# Patient Record
Sex: Female | Born: 1935 | Race: Black or African American | Hispanic: No | Smoking: Never smoker
Health system: Southern US, Community
[De-identification: ages and names within clinical notes are randomized; demographics above are authoritative.]

## PROBLEM LIST (undated history)

## (undated) DIAGNOSIS — K219 Gastro-esophageal reflux disease without esophagitis: Secondary | ICD-10-CM

## (undated) DIAGNOSIS — N3010 Interstitial cystitis (chronic) without hematuria: Secondary | ICD-10-CM

## (undated) DIAGNOSIS — K5900 Constipation, unspecified: Secondary | ICD-10-CM

## (undated) DIAGNOSIS — K5790 Diverticulosis of intestine, part unspecified, without perforation or abscess without bleeding: Secondary | ICD-10-CM

## (undated) DIAGNOSIS — N329 Bladder disorder, unspecified: Secondary | ICD-10-CM

## (undated) DIAGNOSIS — N289 Disorder of kidney and ureter, unspecified: Secondary | ICD-10-CM

## (undated) HISTORY — PX: HERNIA REPAIR: SHX51

---

## 1998-09-06 ENCOUNTER — Ambulatory Visit (HOSPITAL_COMMUNITY): Admission: RE | Admit: 1998-09-06 | Discharge: 1998-09-06 | Payer: Self-pay | Admitting: Pulmonary Disease

## 1998-09-06 ENCOUNTER — Encounter: Payer: Self-pay | Admitting: Pulmonary Disease

## 1999-10-10 ENCOUNTER — Emergency Department (HOSPITAL_COMMUNITY): Admission: EM | Admit: 1999-10-10 | Discharge: 1999-10-10 | Payer: Self-pay | Admitting: Emergency Medicine

## 2000-01-01 ENCOUNTER — Encounter: Admission: RE | Admit: 2000-01-01 | Discharge: 2000-01-01 | Payer: Self-pay | Admitting: Urology

## 2000-01-01 ENCOUNTER — Encounter: Payer: Self-pay | Admitting: Urology

## 2000-01-02 ENCOUNTER — Ambulatory Visit (HOSPITAL_BASED_OUTPATIENT_CLINIC_OR_DEPARTMENT_OTHER): Admission: RE | Admit: 2000-01-02 | Discharge: 2000-01-02 | Payer: Self-pay | Admitting: Urology

## 2000-01-12 ENCOUNTER — Encounter: Payer: Self-pay | Admitting: Urology

## 2000-01-12 ENCOUNTER — Encounter: Admission: RE | Admit: 2000-01-12 | Discharge: 2000-01-12 | Payer: Self-pay | Admitting: Urology

## 2000-02-05 ENCOUNTER — Other Ambulatory Visit: Admission: RE | Admit: 2000-02-05 | Discharge: 2000-02-05 | Payer: Self-pay | Admitting: *Deleted

## 2000-02-06 ENCOUNTER — Other Ambulatory Visit: Admission: RE | Admit: 2000-02-06 | Discharge: 2000-02-06 | Payer: Self-pay | Admitting: *Deleted

## 2000-02-06 ENCOUNTER — Encounter (INDEPENDENT_AMBULATORY_CARE_PROVIDER_SITE_OTHER): Payer: Self-pay

## 2000-04-11 ENCOUNTER — Ambulatory Visit (HOSPITAL_COMMUNITY): Admission: RE | Admit: 2000-04-11 | Discharge: 2000-04-11 | Payer: Self-pay | Admitting: *Deleted

## 2000-04-11 ENCOUNTER — Encounter (INDEPENDENT_AMBULATORY_CARE_PROVIDER_SITE_OTHER): Payer: Self-pay | Admitting: Specialist

## 2001-03-03 ENCOUNTER — Encounter: Admission: RE | Admit: 2001-03-03 | Discharge: 2001-03-03 | Payer: Self-pay | Admitting: Urology

## 2001-03-03 ENCOUNTER — Encounter: Payer: Self-pay | Admitting: Urology

## 2001-03-13 ENCOUNTER — Ambulatory Visit (HOSPITAL_COMMUNITY): Admission: RE | Admit: 2001-03-13 | Discharge: 2001-03-13 | Payer: Self-pay | Admitting: *Deleted

## 2001-03-18 ENCOUNTER — Encounter: Payer: Self-pay | Admitting: *Deleted

## 2001-03-18 ENCOUNTER — Ambulatory Visit (HOSPITAL_COMMUNITY): Admission: RE | Admit: 2001-03-18 | Discharge: 2001-03-18 | Payer: Self-pay | Admitting: *Deleted

## 2001-06-16 ENCOUNTER — Ambulatory Visit (HOSPITAL_COMMUNITY): Admission: RE | Admit: 2001-06-16 | Discharge: 2001-06-16 | Payer: Self-pay | Admitting: *Deleted

## 2001-06-16 ENCOUNTER — Encounter: Payer: Self-pay | Admitting: *Deleted

## 2004-09-26 ENCOUNTER — Encounter (INDEPENDENT_AMBULATORY_CARE_PROVIDER_SITE_OTHER): Payer: Self-pay | Admitting: Specialist

## 2004-09-26 ENCOUNTER — Ambulatory Visit (HOSPITAL_COMMUNITY): Admission: RE | Admit: 2004-09-26 | Discharge: 2004-09-26 | Payer: Self-pay | Admitting: *Deleted

## 2004-12-12 ENCOUNTER — Encounter: Admission: RE | Admit: 2004-12-12 | Discharge: 2004-12-12 | Payer: Self-pay | Admitting: *Deleted

## 2004-12-19 ENCOUNTER — Emergency Department (HOSPITAL_COMMUNITY): Admission: EM | Admit: 2004-12-19 | Discharge: 2004-12-19 | Payer: Self-pay | Admitting: Emergency Medicine

## 2005-02-19 ENCOUNTER — Ambulatory Visit (HOSPITAL_COMMUNITY): Admission: RE | Admit: 2005-02-19 | Discharge: 2005-02-19 | Payer: Self-pay | Admitting: Pulmonary Disease

## 2005-12-10 ENCOUNTER — Ambulatory Visit (HOSPITAL_COMMUNITY): Admission: RE | Admit: 2005-12-10 | Discharge: 2005-12-10 | Payer: Self-pay | Admitting: *Deleted

## 2005-12-10 ENCOUNTER — Encounter (INDEPENDENT_AMBULATORY_CARE_PROVIDER_SITE_OTHER): Payer: Self-pay | Admitting: Specialist

## 2006-06-07 ENCOUNTER — Ambulatory Visit (HOSPITAL_COMMUNITY): Admission: RE | Admit: 2006-06-07 | Discharge: 2006-06-07 | Payer: Self-pay | Admitting: Pulmonary Disease

## 2006-06-26 ENCOUNTER — Encounter: Payer: Self-pay | Admitting: Vascular Surgery

## 2006-06-26 ENCOUNTER — Ambulatory Visit (HOSPITAL_COMMUNITY): Admission: RE | Admit: 2006-06-26 | Discharge: 2006-06-26 | Payer: Self-pay | Admitting: Pulmonary Disease

## 2007-01-21 ENCOUNTER — Emergency Department (HOSPITAL_COMMUNITY): Admission: EM | Admit: 2007-01-21 | Discharge: 2007-01-21 | Payer: Self-pay | Admitting: Emergency Medicine

## 2008-03-15 ENCOUNTER — Inpatient Hospital Stay (HOSPITAL_COMMUNITY): Admission: EM | Admit: 2008-03-15 | Discharge: 2008-03-17 | Payer: Self-pay | Admitting: Emergency Medicine

## 2008-03-17 ENCOUNTER — Encounter (INDEPENDENT_AMBULATORY_CARE_PROVIDER_SITE_OTHER): Payer: Self-pay | Admitting: Cardiology

## 2008-04-10 ENCOUNTER — Emergency Department (HOSPITAL_COMMUNITY): Admission: EM | Admit: 2008-04-10 | Discharge: 2008-04-11 | Payer: Self-pay | Admitting: Emergency Medicine

## 2008-05-01 ENCOUNTER — Emergency Department (HOSPITAL_COMMUNITY): Admission: EM | Admit: 2008-05-01 | Discharge: 2008-05-01 | Payer: Self-pay | Admitting: Emergency Medicine

## 2008-05-10 ENCOUNTER — Ambulatory Visit (HOSPITAL_COMMUNITY): Admission: RE | Admit: 2008-05-10 | Discharge: 2008-05-10 | Payer: Self-pay | Admitting: *Deleted

## 2008-05-10 ENCOUNTER — Encounter (INDEPENDENT_AMBULATORY_CARE_PROVIDER_SITE_OTHER): Payer: Self-pay | Admitting: *Deleted

## 2008-05-27 ENCOUNTER — Emergency Department (HOSPITAL_COMMUNITY): Admission: EM | Admit: 2008-05-27 | Discharge: 2008-05-27 | Payer: Self-pay | Admitting: *Deleted

## 2008-05-28 ENCOUNTER — Inpatient Hospital Stay (HOSPITAL_COMMUNITY): Admission: EM | Admit: 2008-05-28 | Discharge: 2008-06-04 | Payer: Self-pay | Admitting: Emergency Medicine

## 2008-05-31 ENCOUNTER — Ambulatory Visit: Payer: Self-pay | Admitting: Gastroenterology

## 2008-06-01 ENCOUNTER — Encounter: Payer: Self-pay | Admitting: Gastroenterology

## 2008-07-01 ENCOUNTER — Encounter: Admission: RE | Admit: 2008-07-01 | Discharge: 2008-07-01 | Payer: Self-pay | Admitting: Cardiology

## 2009-01-26 ENCOUNTER — Inpatient Hospital Stay (HOSPITAL_COMMUNITY): Admission: EM | Admit: 2009-01-26 | Discharge: 2009-01-29 | Payer: Self-pay | Admitting: Emergency Medicine

## 2009-01-27 ENCOUNTER — Encounter (INDEPENDENT_AMBULATORY_CARE_PROVIDER_SITE_OTHER): Payer: Self-pay | Admitting: Pulmonary Disease

## 2009-01-27 ENCOUNTER — Ambulatory Visit: Payer: Self-pay | Admitting: Surgery

## 2009-03-21 IMAGING — CT CT ABDOMEN W/O CM
1 of 2 series · 15 of 32 positions shown, 19 images · non-contrast
Comparison: 03/03/2001

CLINICAL DATA: Nausea.  Pain.  Possible kidney stone.

CT ABDOMEN WITHOUT CONTRAST,CT PELVIS WITHOUT CONTRAST
TECHNIQUE: Multidetector CT imaging of the abdomen was performed
following the standard protocol without IV contrast.,Technique:
Multidetector CT imaging of the pelvis was performed following the
standard protocol without intravenous contrast.
TECHNIQUE: Multidetector CT imaging of the pelvis was performed
following the standard protocol without intravenous contrast.

[Series 2: 160 stone 5.0 b40f st · axial · 0.61mm/px · z∈[-392,-62]mm · 15 of 73 slices shown, 19 images]
[im 4/73  soft-tissue]
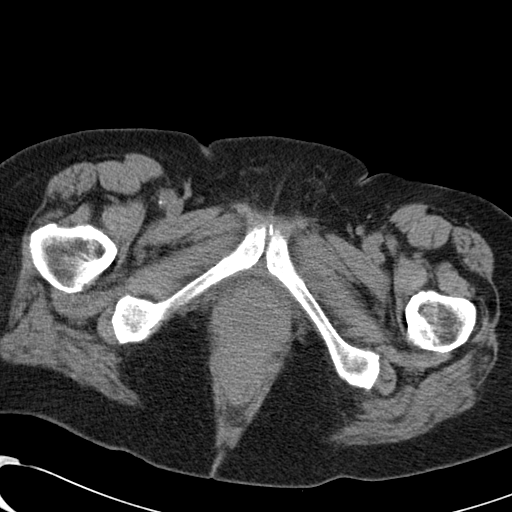
[im 4/73  bone]
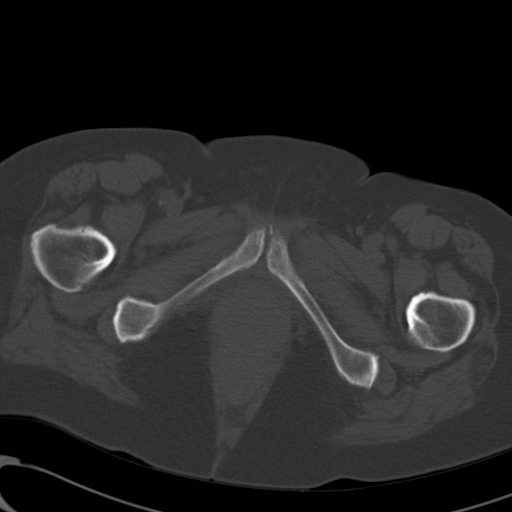
[im 10/73  soft-tissue]
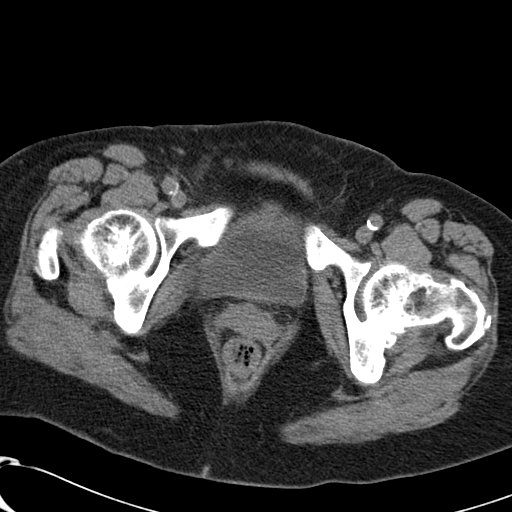
[im 16/73  soft-tissue]
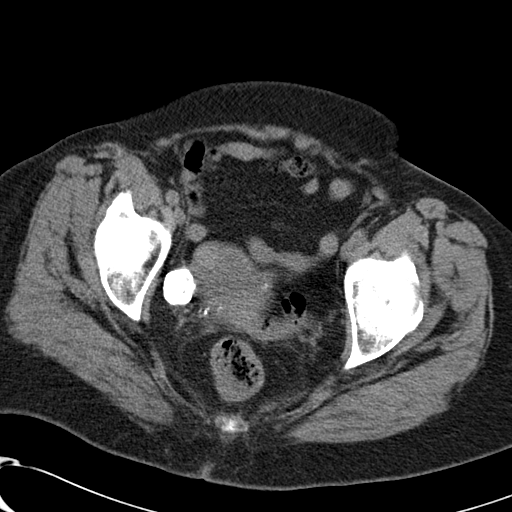
[im 22/73  soft-tissue]
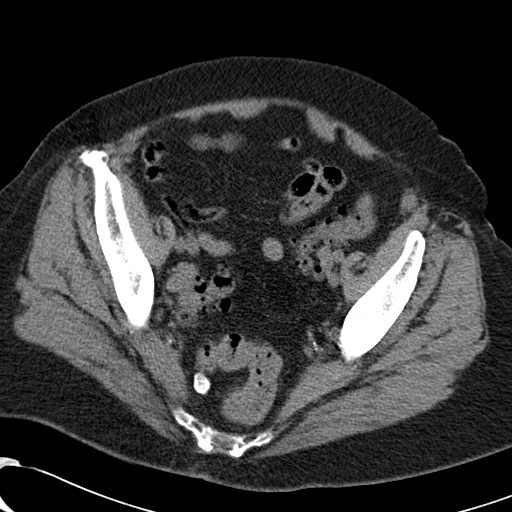
[im 25/73  soft-tissue]
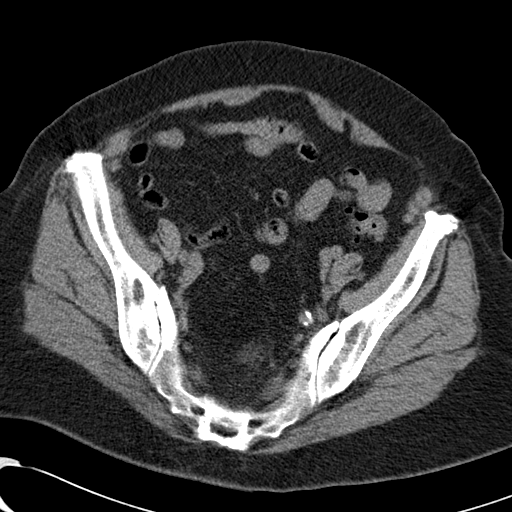
[im 31/73  soft-tissue]
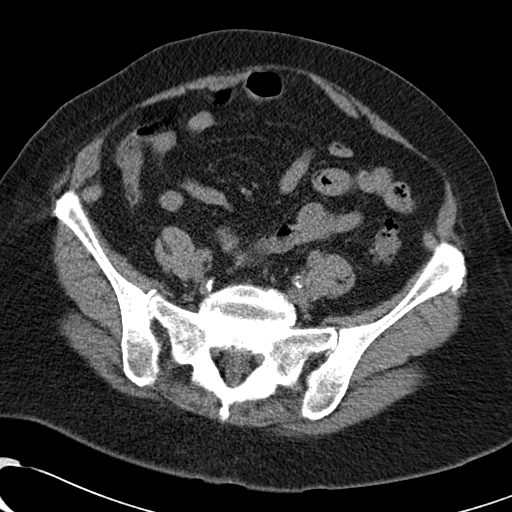
[im 37/73  soft-tissue]
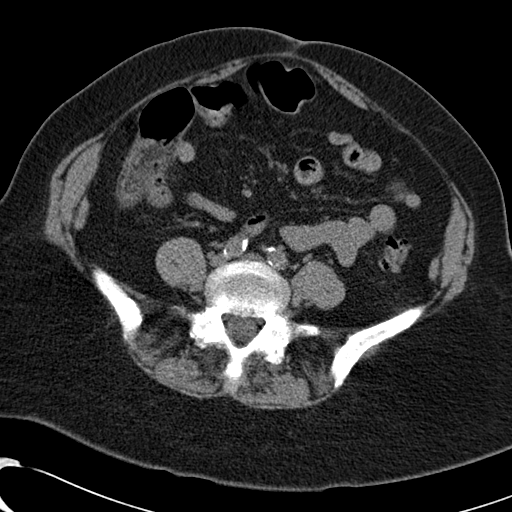
[im 43/73  soft-tissue]
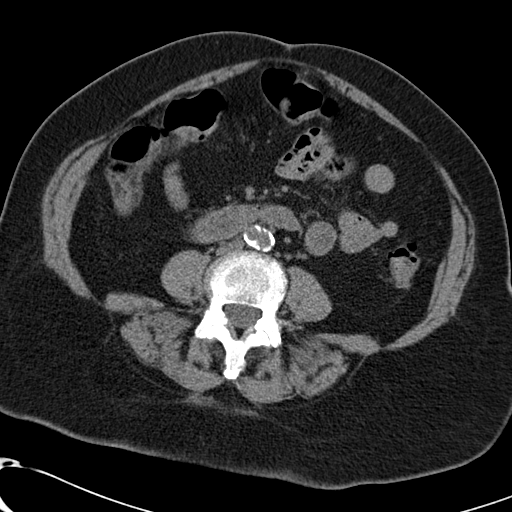
[im 49/73  soft-tissue]
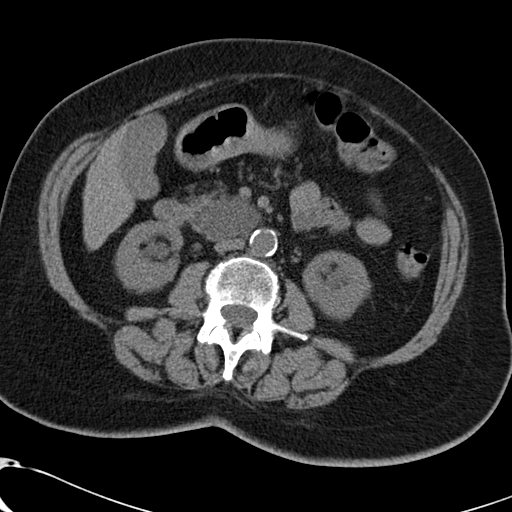
[im 49/73  bone]
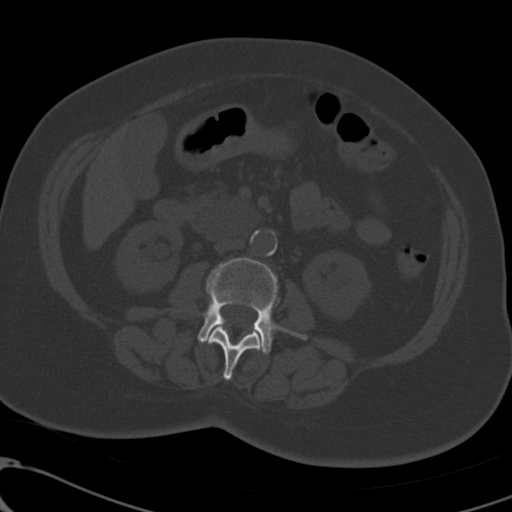
[im 52/73  soft-tissue]
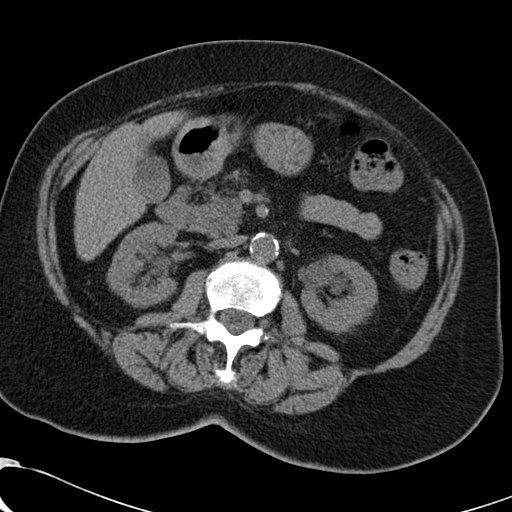
[im 58/73  soft-tissue]
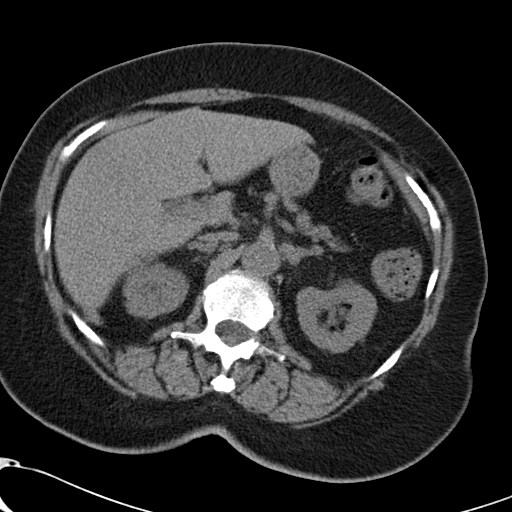
[im 61/73  lung]
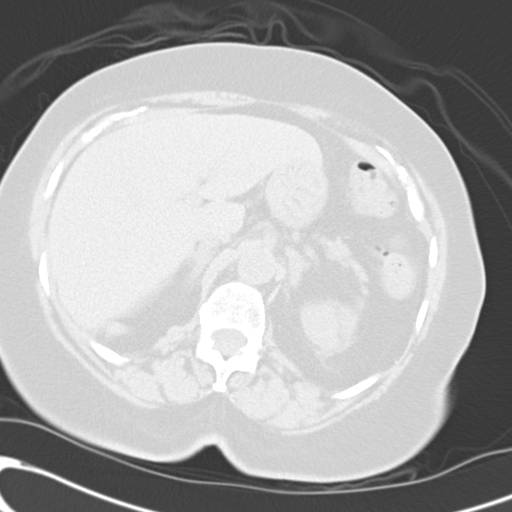
[im 64/73  soft-tissue]
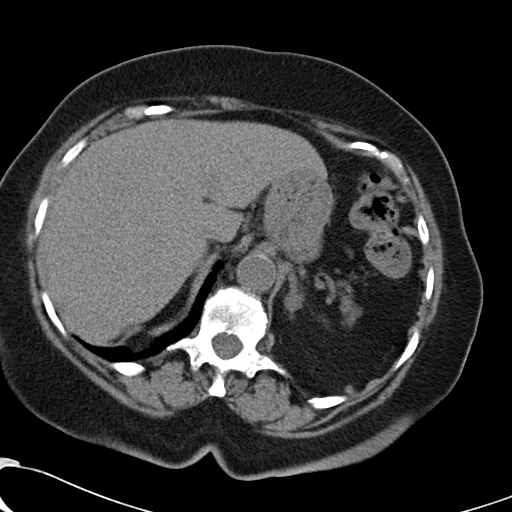
[im 64/73  lung]
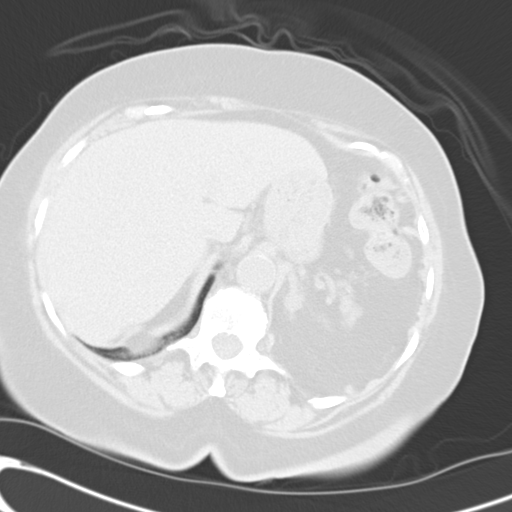
[im 67/73  lung]
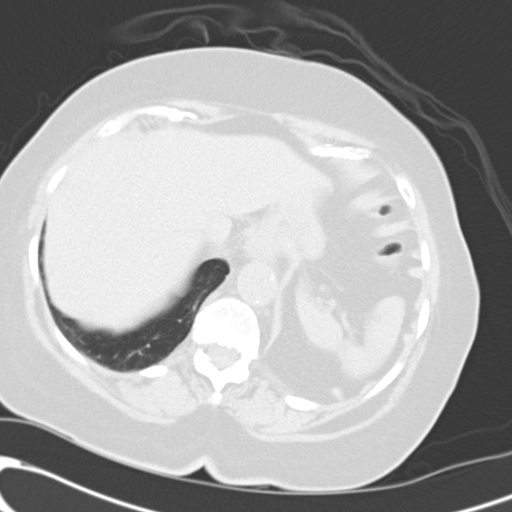
[im 70/73  soft-tissue]
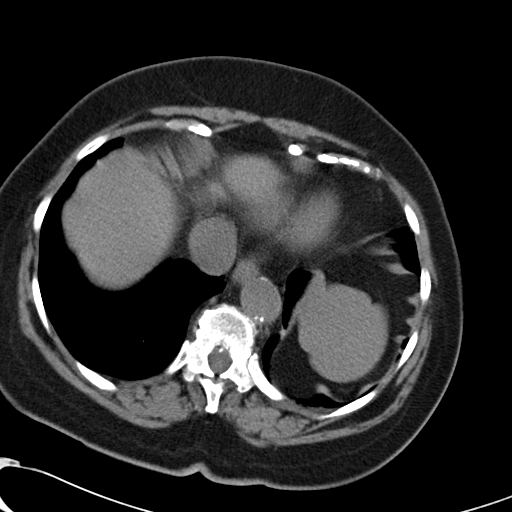
[im 70/73  lung]
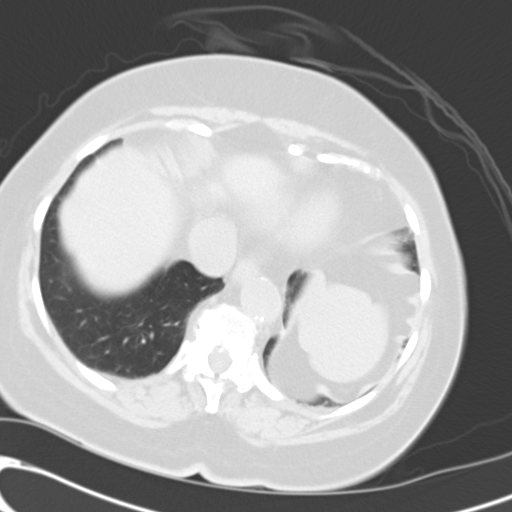

[15 of 32 positions shown; findings below may reference images not displayed]

FINDINGS: Lung bases are clear.  No pleural or pericardial fluid.
There is some elevation of the left hemidiaphragm, possibly with
the defect allowing some herniation of fat.

The liver appears normal and the contrasted state.  No calcified
gallstones.  The spleen is normal.  The pancreas shows the presence
of a mass in the uncinate process measuring 4 cm in diameter.  This
is worrisome for pancreatic adenocarcinoma or cystic neoplasm of
the pancreas.  A smaller abnormality was present in this location
in 3003.  The tail of pancreas is atrophic.  I do not see any
metastatic lymph nodes.  There are parapelvic cyst but no primary
renal pathology.  No free fluid or air. The aorta and IVC are
unremarkable.
IMPRESSION: 4 cm low density mass in the uncinate process of the pancreas
worrisome for pancreatic carcinoma or cystic neoplasm of the
pancreas.  No sign of metastatic disease.

CT PELVIS WITHOUT CONTRAST
FINDINGS: No free fluid in the pelvis.  The uterus and adnexal
regions do not show any acute pathology.  There are some fibroids.
The appendix is normal.  No other bowel pathology.  No adenopathy.
IMPRESSION: No significant finding in the pelvis.

## 2009-04-24 IMAGING — CR DG CHEST 2V
2 series · 2 of 2 positions shown · non-contrast
Comparison: 03/15/2008

CLINICAL DATA: Chest pain

CHEST - 2 VIEW

[view not recorded (1 of 2)]
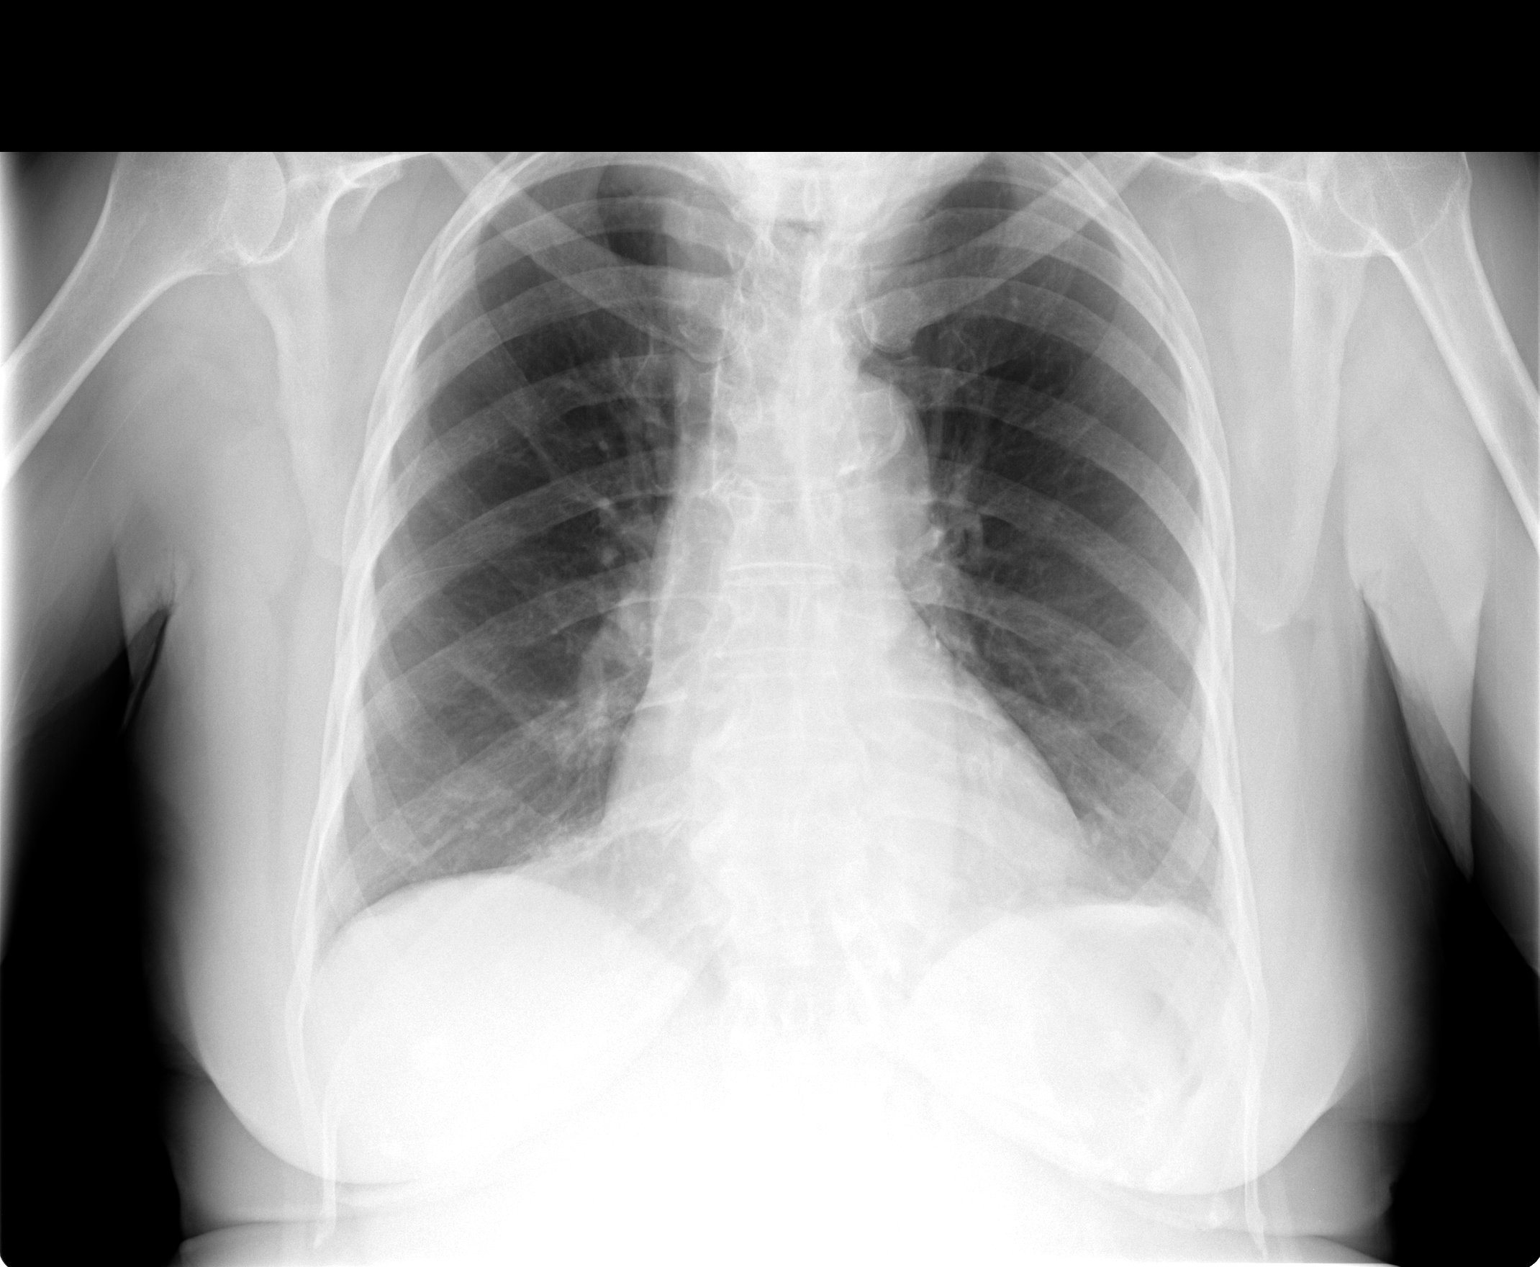

[view not recorded (2 of 2)]
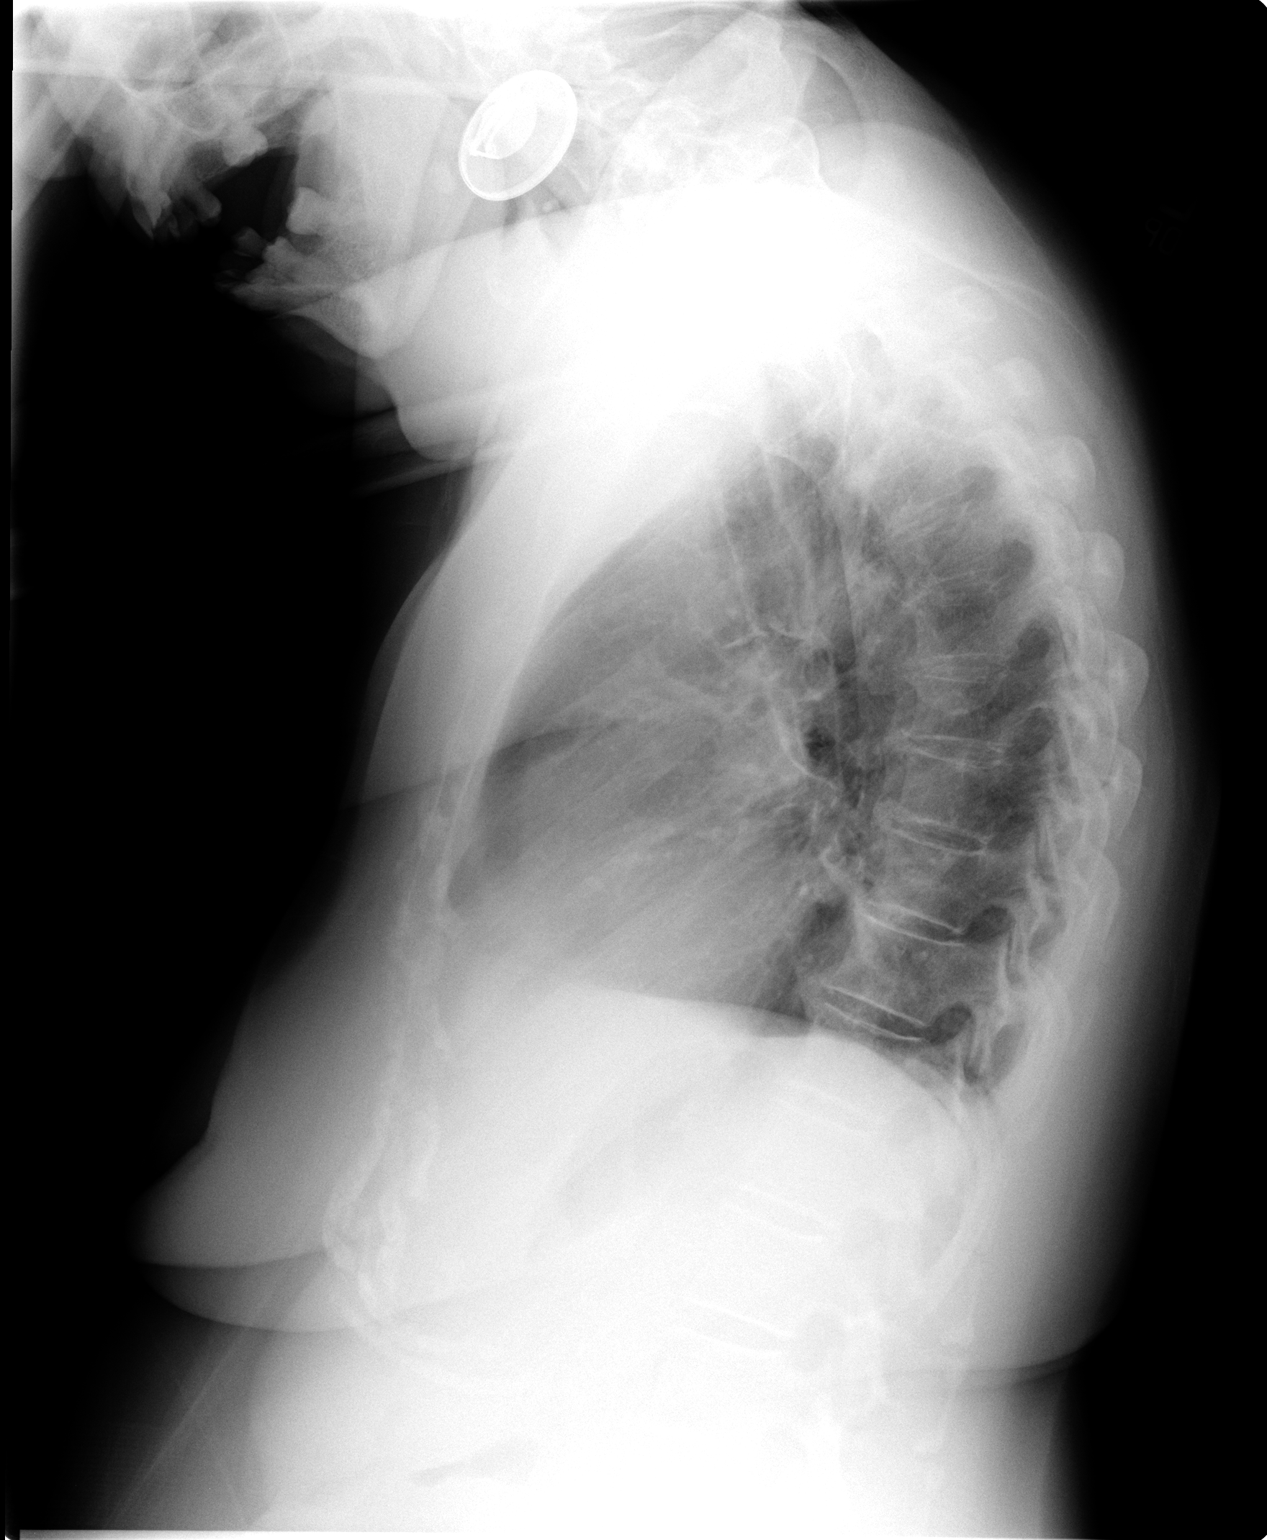

[2 of 2 positions shown; findings below may reference images not displayed]

FINDINGS: Cardiac size is towards the upper limits of normal to
slightly enlarged. No vascular congestion. Lungs are clear of an
active process.  Bony thorax is intact.
IMPRESSION: Mild cardiomegaly.

## 2010-05-08 ENCOUNTER — Emergency Department (HOSPITAL_BASED_OUTPATIENT_CLINIC_OR_DEPARTMENT_OTHER)
Admission: EM | Admit: 2010-05-08 | Discharge: 2010-05-08 | Payer: Self-pay | Source: Home / Self Care | Admitting: Emergency Medicine

## 2010-09-26 ENCOUNTER — Emergency Department (HOSPITAL_COMMUNITY)
Admission: EM | Admit: 2010-09-26 | Discharge: 2010-09-26 | Disposition: A | Discharge status: Home / Self Care | Payer: Medicare Other | Attending: Emergency Medicine | Admitting: Emergency Medicine

## 2010-09-26 ENCOUNTER — Emergency Department (HOSPITAL_COMMUNITY): Payer: Medicare Other

## 2010-09-26 DIAGNOSIS — K219 Gastro-esophageal reflux disease without esophagitis: Secondary | ICD-10-CM | POA: Insufficient documentation

## 2010-09-26 DIAGNOSIS — Z79899 Other long term (current) drug therapy: Secondary | ICD-10-CM | POA: Insufficient documentation

## 2010-09-26 DIAGNOSIS — R110 Nausea: Secondary | ICD-10-CM | POA: Insufficient documentation

## 2010-09-26 DIAGNOSIS — R51 Headache: Secondary | ICD-10-CM | POA: Insufficient documentation

## 2010-09-26 DIAGNOSIS — I10 Essential (primary) hypertension: Secondary | ICD-10-CM | POA: Insufficient documentation

## 2010-09-26 DIAGNOSIS — E785 Hyperlipidemia, unspecified: Secondary | ICD-10-CM | POA: Insufficient documentation

## 2010-09-26 DIAGNOSIS — R42 Dizziness and giddiness: Secondary | ICD-10-CM | POA: Insufficient documentation

## 2010-09-26 LAB — POCT I-STAT, CHEM 8
BUN: 18 mg/dL (ref 6–23)
Calcium, Ion: 1.04 mmol/L — ABNORMAL LOW (ref 1.12–1.32)
Chloride: 109 mEq/L (ref 96–112)
Creatinine, Ser: 1.2 mg/dL (ref 0.4–1.2)
Glucose, Bld: 106 mg/dL — ABNORMAL HIGH (ref 70–99)
HCT: 36 % (ref 36.0–46.0)
Hemoglobin: 12.2 g/dL (ref 12.0–15.0)
Potassium: 4.1 meq/L (ref 3.5–5.1)
Sodium: 139 meq/L (ref 135–145)
TCO2: 22 mmol/L (ref 0–100)

## 2010-10-04 LAB — URINALYSIS, ROUTINE W REFLEX MICROSCOPIC
Glucose, UA: NEGATIVE mg/dL
Ketones, ur: NEGATIVE mg/dL
Nitrite: NEGATIVE
Protein, ur: NEGATIVE mg/dL
pH: 6 (ref 5.0–8.0)

## 2010-10-04 LAB — URINE CULTURE: Culture  Setup Time: 201110172042

## 2010-10-29 LAB — URINALYSIS, ROUTINE W REFLEX MICROSCOPIC
Specific Gravity, Urine: 1.022 (ref 1.005–1.030)
Urobilinogen, UA: 1 mg/dL (ref 0.0–1.0)

## 2010-10-29 LAB — COMPREHENSIVE METABOLIC PANEL
BUN: 24 mg/dL — ABNORMAL HIGH (ref 6–23)
CO2: 26 mEq/L (ref 19–32)
Calcium: 9.8 mg/dL (ref 8.4–10.5)
Chloride: 104 mEq/L (ref 96–112)
Creatinine, Ser: 1.53 mg/dL — ABNORMAL HIGH (ref 0.4–1.2)
GFR calc Af Amer: 39 mL/min — ABNORMAL LOW (ref >60)
GFR calc non Af Amer: 32 mL/min — ABNORMAL LOW (ref >60)
Glucose, Bld: 117 mg/dL — ABNORMAL HIGH (ref 70–99)
Total Protein: 7 g/dL (ref 6.0–8.3)

## 2010-10-29 LAB — COMPREHENSIVE METABOLIC PANEL WITH GFR
ALT: 11 U/L (ref 0–35)
AST: 20 U/L (ref 0–37)
Albumin: 3.7 g/dL (ref 3.5–5.2)
Alkaline Phosphatase: 41 U/L (ref 39–117)
BUN: 19 mg/dL (ref 6–23)
CO2: 26 meq/L (ref 19–32)
Calcium: 9.2 mg/dL (ref 8.4–10.5)
Chloride: 110 meq/L (ref 96–112)
Creatinine, Ser: 1.41 mg/dL — ABNORMAL HIGH (ref 0.4–1.2)
GFR calc non Af Amer: 35 mL/min — ABNORMAL LOW
Glucose, Bld: 105 mg/dL — ABNORMAL HIGH (ref 70–99)
Potassium: 4.7 meq/L (ref 3.5–5.1)
Sodium: 141 meq/L (ref 135–145)
Total Bilirubin: 0.7 mg/dL (ref 0.3–1.2)
Total Protein: 6.6 g/dL (ref 6.0–8.3)

## 2010-10-29 LAB — CBC
Hemoglobin: 12 g/dL (ref 12.0–15.0)
MCV: 91.4 fL (ref 78.0–100.0)
Platelets: 197 10*3/uL (ref 150–400)
RBC: 3.89 MIL/uL (ref 3.87–5.11)
RDW: 14.9 % (ref 11.5–15.5)
RDW: 15.3 % (ref 11.5–15.5)

## 2010-10-29 LAB — LIPID PANEL
Cholesterol: 234 mg/dL — ABNORMAL HIGH (ref 0–200)
LDL Cholesterol: 168 mg/dL — ABNORMAL HIGH (ref 0–99)
VLDL: 33 mg/dL (ref 0–40)

## 2010-10-29 LAB — BASIC METABOLIC PANEL
BUN: 15 mg/dL (ref 6–23)
CO2: 26 mEq/L (ref 19–32)
Chloride: 108 mEq/L (ref 96–112)
Glucose, Bld: 123 mg/dL — ABNORMAL HIGH (ref 70–99)
Potassium: 4 mEq/L (ref 3.5–5.1)

## 2010-10-29 LAB — DIFFERENTIAL
Eosinophils Absolute: 0.1 10*3/uL (ref 0.0–0.7)
Lymphs Abs: 1.5 10*3/uL (ref 0.7–4.0)
Monocytes Absolute: 0.3 10*3/uL (ref 0.1–1.0)
Neutro Abs: 4.6 10*3/uL (ref 1.7–7.7)

## 2010-10-29 LAB — POCT CARDIAC MARKERS
Myoglobin, poc: 95 ng/mL (ref 12–200)
Troponin i, poc: <0.05 ng/mL (ref 0.00–0.09)

## 2010-10-29 LAB — CARDIAC PANEL(CRET KIN+CKTOT+MB+TROPI): CK, MB: 1.9 ng/mL (ref 0.3–4.0)

## 2010-12-05 NOTE — Consult Note (Signed)
[name], [name]             ACCOUNT NO.:  [id number]   MEDICAL RECORD NO.:  [id number]          PATIENT TYPE:  INP   LOCATION:  1344                         FACILITY:  [facility]   PHYSICIAN:  Almond Lint, MD       DATE OF BIRTH:  07/20/1920   DATE OF CONSULTATION:  05/31/2008  DATE OF DISCHARGE:                                 CONSULTATION   REFERRING PHYSICIAN:  Mina Marble, M.D.   CHIEF COMPLAINT:  Abdominal pain and pancreatic mass.   HISTORY OF PRESENT ILLNESS:  [name] as an 77 year old female who  presents with approximately 3-4 weeks of decreased appetite and  bilateral lower quadrant abdominal pain.  She has had some difficulty  with nausea.  She denies diarrhea.  Denies fevers or chills.  She has  pain with bowel movements and urinating as well as with eating.  She  says that she periodically has had a decreased appetite on and off for  several years.  She has lost 5 pounds in the past couple months.  She  had endoscopy back in October which was unremarkable.  She underwent CT  scanning without contrast which was notable for a pancreatic mass.  On  looking back at her prior imaging, she did have a cystic  mass in the  uncinate process back in 2002.  She denies any steatorrhea.   REVIEW OF SYSTEMS:  Is otherwise negative x11 systems.   PAST MEDICAL HISTORY:  Is significant for:  1. Hypertension.  2. Hypercholesterolemia.  3. Diverticulitis.  4. Esophageal reflux.  5. Hiatal hernia.  6. Bladder spasms.   PAST SURGICAL HISTORY:  She describes an umbilical hernia repair.   ALLERGIES:  She cannot take LIPITOR.   MEDICATIONS:  She is currently on Norvasc, Ensure, heparin SQ,  lactulose, lorazepam , Elmiron, Zocor,, Triamterine/hydrochlorothiazide,  Phenergan, morphine and Zofran.   SOCIAL HISTORY:  She is a very alert and capable 77 year old and takes  care of herself and lives at home.  She is widowed.  She does not smoke  or drink alcohol.   FAMILY HISTORY:  She has a brother with hyperlipidemia.  Otherwise she  is unsure of her parents' history as they died when she was young.   VITALS:  T-max is 99.3, pulse 80, respirations 20, blood pressure  135/59.  O2 saturation 97% on room air.  She was sleeping but arouses  easily.  HEART:  Is regular.  LUNGS:  Are clear.  NECK:  Is supple with no lymphadenopathy.  HEENT:  Sclerae anicteric. Head is normocephalic and atraumatic.  ABDOMEN:  Soft, nondistended, slightly tender in the suprapubic and  bilateral lower quadrant region.  Bowel sounds were present.  EXTREMITIES:  Warm and well-perfused with no edema.   LABORATORIES:  From the 6th:  CBC is unremarkable with a white count of  5.3 and a hematocrit of39.1.  Coags are normal.  Chemistries from  yesterday  demonstrate a slightly elevated glucose at 121 and a GFR  which is slightly low.  LFTs from the 6th are normal as well as amylase  and lipase.  CEA is 1.1.  On November 5 she had one urine that was  inadequate and a repeat urine which was normal.   In terms of imaging, images are reviewed directly with the radiologist.  These demonstrate a 4 cm low-density mass in the uncinate process of the  pancreas worrisome for a cystic neoplasm or pancreatic carcinoma.  In  reviewing this with the radiologist he does feel like that this is a  cystic mass based on the Hounsfield units in the mass.  This is compared  to an MRCP in November 2002.  At this point it was described as a  pseudocyst.  Ar that point it was approximately 2 cm wide.   ASSESSMENT:  [name] is a very pleasant 77 year old female with a  likely cystic mass in her uncinate process.  Hopefully this is the same  mass that was there 7 years ago that  has just gotten slightly bigger.  If this is a pseudocyst  that would be quite long for it not to resolve.  She is not having any symptoms of pancreatic endocrine or exocrine  insufficiency at this time.  It seems that  most her pain also in the  lower abdomen which likely is not from this mass.  Gastroenterology  plans to perform an endoscopic ultrasound tomorrow with biopsy.  This  will hopefully give [place] more information in that if this does appear to  be high in amylase, it is likely a pseudocyst and non malignant.  Also  if it is a cystic neoplasm and slow-growing it may be in this 87-year-  old female that we could either aspirate it or observe it safely.  I  also discussed with the Gastroenterology nurse practitioner that she was  planning on drawing a CA-19-9 which also may be helpful in this setting.      Almond Lint, MD  Electronically Signed     FB/MEDQ  D:  05/31/2008  T:  05/31/2008  Job:  [id number]

## 2010-12-05 NOTE — Discharge Summary (Signed)
[name], [name]             ACCOUNT NO.:  [id number]   MEDICAL RECORD NO.:  [id number]          PATIENT TYPE:  INP   LOCATION:  3736                         FACILITY:  [facility]   PHYSICIAN:  Georga Hacking, M.D.DATE OF BIRTH:  05/14/1921   DATE OF ADMISSION:  03/15/2008  DATE OF DISCHARGE:  03/17/2008                               DISCHARGE SUMMARY   FINAL DIAGNOSES:  1. Chest pain, possible angina, possible due to coronary spasm or      small-vessel disease.      a.     Mild elevation of troponin.      b.     Coronary arteries showed somewhat tortuous small distal       vessels, but no significant obstruction is noted.  2. Hypertensive heart disease with hyperdynamic left ventricular      function and left ventricular hypertrophy with mild left      ventricular outflow obstruction, which is dynamic.  3. Hyperlipidemia.  4. Arthritis.  5. History of diverticulosis.  6. History of esophageal reflux.  7. History of bladder spasms.   PROCEDURES:  1. Echocardiogram.  2. Cardiac catheterization.   HISTORY:  This 77 year old female has a prior history of hypertension,  hyperlipidemia, esophageal reflux, hiatal hernia, and bladder spasm.  She normally lives alone and takes care of her own affairs.  She has  some episodic chest discomfort, recently attributed to hiatal hernia  lasting 5-10 minutes.  The day of admission, she had a severe episode of  substernal tightness lasting around 1 hour with radiation up into her  neck, resolving spontaneously, and brought to the emergency room.  EKG  was initially unremarkable, but she had some slight elevations of  troponin and was brought into the hospital for further evaluation.  Please see the previously dictated history and physical for remainder of  the details.   HOSPITAL COURSE:  The patient was placed on intravenous heparin, and  serial enzymes showed elevation of troponin to 0.3 and gradually came  down.  EKG remained  unremarkable.  Because of the abnormal troponins and  suggestive history, she underwent catheterization the next day.  She had  hyperdynamic left ventricular function.  There is a question of dynamic  obstruction across the left ventricular outflow tract.  The coronary  arteries were tortuous and the right coronary distal vessels were  somewhat small, but there was no significant focal obstructive stenoses  noted.  An echocardiogram done the next day showed concentric LVH with  hyperdynamic systolic function and mild aortic regurgitation without  aortic stenosis.  Her laboratory data showed normal CBC.  She had mild  renal insufficiency with a BUN of 25 and creatinine of 1.44.  D-dimer  was 0.54.  It was opted to treat her medically at this time.   It is recommended that she would be discharged on the following  medications:  1. She will be on nitroglycerin 1/150 sublingual p.r.n.  2. Aspirin 81 mg daily.  3. WelChol 625 mg twice a day.  4. Hydrocodone 1-2 tablets as needed for pain.  5. Triamterene and hydrochlorothiazide 1 tablet  Monday, Wednesday, and      Friday.  6. Simvastatin 20 mg daily.  7. Amlodipine 5 mg daily.  8. Prilosec OTC.  9. Omeprazole 20 mg daily.  10.Elmiron 100 mg twice a day.  11.Eye drops for glaucoma.   It is recommended that she follow up in 2 weeks to see Dr. Donnie Aho in his  office.  She is to use nitroglycerin as needed for pain.  She is to eat  a fat-modified diet.      Georga Hacking, M.D.  Electronically Signed     WST/MEDQ  D:  03/17/2008  T:  03/18/2008  Job:  [id number]   cc:   Mina Marble, M.D.

## 2010-12-05 NOTE — Consult Note (Signed)
[name], [name]             ACCOUNT NO.:  [id number]   MEDICAL RECORD NO.:  [id number]          PATIENT TYPE:  INP   LOCATION:  1432                         FACILITY:  [facility]   PHYSICIAN:  Lindaann Slough, M.D.  DATE OF BIRTH:  [birth year]   DATE OF CONSULTATION:  01/28/2009  DATE OF DISCHARGE:                                 CONSULTATION   REQUESTING PHYSICIAN:  Dr.  Petra Kuba.   REASON FOR CONSULTATION:  Severe abdominal pain with a history of  chronic cystitis.   HISTORY OF PRESENT ILLNESS:  This is an 77 year old female who was  admitted on January 26, 2009 after passing out due to uncontrollable  abdominal pain.  She states that the pain is located in the left lower  quadrant and suprapubic area.  This pain has always been present, but  she states that it has gradually worsened over the course of the last 2-  3 weeks.  She had made an appointment with her urologist, Dr. Brunilda Payor, and  was on her way to her appointment when she had her syncopal episode.  The pain is persistent and is more severe after urination.  She does  have positive frequency, urgency, dysuria, and nocturia.  She denies any  fevers or chills, diarrhea, or hematuria.  She also denies any chest  pain or shortness of breath. Her pain was relieved by nothing and does  not radiate.  She states that hydrocodone is her only medication that  she takes for the pain at home at this time.  Her urinalysis is negative  for infection.   Her urological history is significant for chronic interstitial cystitis.  She sees Dr. Brunilda Payor for this ongoing problem and was last seen in April of  2010 for positive complaints of frequency, urgency, and dysuria.  Her  urinalysis was negative at that time for infection.  She was started on  Elmiron 100 mg b.i.d. at that time.  She states that she has been taking  those medications at home but has not taken them within the past week.   REVIEW OF SYSTEMS:  Positive for frequency, urgency,  dysuria, and  nocturia.  She denies any fevers or chills, nausea, vomiting, or  diarrhea, or hematuria.  She denies shortness of breath or chest pain.   PAST MEDICAL HISTORY:  1. Chronic interstitial cystitis.  2. Hypertension.  3. Hypercholesterolemia.  4. Diverticulitis.  5. Esophageal reflux.  6. Hiatal hernia.  7. Bladder spasms.   PAST SURGICAL HISTORY:  1. Cystoscopy with urethral dilation.  2. Hysterectomy with polypectomy with resectoscope and D and C.  3. Upper endoscopy and colonoscopy.   MEDICATIONS:  1. Nitroglycerin sublingual as needed.  2. Hydrocodone b.i.d.  3. What she states is a fluid pill.   FAMILY HISTORY:  She denies any family history of interstitial cystitis  or urological problems.   SOCIAL HISTORY:  She lives in [place].  She is the mother of 2  daughters, neither of whom live in [place].  She has a sister and  niece who live nearby.  She denies any tobacco or alcohol use.  PHYSICAL EXAMINATION:  VITAL SIGNS:  Temperature 98.3, pulse 79,  respirations 16, blood pressure 154/53.  CONSTITUTIONAL: She is a well-developed, well-nourished, height-, weight-  , and age-appropriate female.  She is resting in bed and smiling, in no  acute distress.  HEENT: Normocephalic and atraumatic.  EOMI.  CARDIOVASCULAR: Regular rate and rhythm.  No murmurs, rubs, or gallops.  LUNGS:  Clear to auscultation.  ABDOMEN: Soft.  Tenderness to suprapubic and left lower quadrant with  palpation.  Bowel sounds are positive x4.  No CVA tenderness.  No  Murphy's appreciated.  GU:  Normal __________ without lesion on discharge.  EXTREMITIES: Positive pedal pulses x4.  No edema.  No atrophy.   LABS:  Urinalysis specific gravity 1.022, pH 5.5, bilirubin small, blood  negative, ketones negative, nitrite negative, leukocyte negative. Sodium  141, potassium 4.7, chloride 110, CO2 of 26, BUN 19, creatinine 1.41  (down from 1.53), glucose 105.  WBCs 5.8, hemoglobin 11.3,  hematocrit  33.2, platelets 197.  Radiology CT of the abdomen and pelvis was  negative for renal/ureteral obstruction, hydronephrosis, or bladder  mass.  Bilateral renal cysts unchanged since last CT in 2009.   IMPRESSION AND PLAN:  Chronic interstitial cystitis. Dr. Brunilda Payor is  following her for this problem on an outpatient basis.  She was taking  Elmiron 100 mg b.i.d. when last seen in April of 2010.  She denies any  recent use of this medication. We will restart her Elmiron 100 mg and  change dosing to t.i.d. She is to follow up on an outpatient basis with  Dr. Brunilda Payor in 3 months' for her regularly scheduled appointment in  October.  He will see her later this afternoon.   Thank you for this consultation.      Delia Chimes, NP      Lindaann Slough, M.D.  Electronically Signed    MA/MEDQ  D:  01/28/2009  T:  01/28/2009  Job:  [id number]

## 2010-12-05 NOTE — H&P (Signed)
[name], [name]             ACCOUNT NO.:  [id number]   MEDICAL RECORD NO.:  [id number]          PATIENT TYPE:  EMS   LOCATION:  MAJO                         FACILITY:  [facility]   PHYSICIAN:  Georga Hacking, M.D.DATE OF BIRTH:  05/11/1921   DATE OF ADMISSION:  03/15/2008  DATE OF DISCHARGE:                              HISTORY & PHYSICAL   I was asked to see this 77 year old female by the emergency room  physician for elevation of prolonged chest discomfort and abnormal  troponin.  The patient has a previous history of hypertension,  hyperlipidemia, esophageal reflux, hiatal hernia and a history of  bladder spasms and bladder difficulties.  She normally lives alone and  is independent and takes care of her own affairs.  She has had a several  week history of some episodic chest discomfort that she recently  attributed to a hiatal hernia, lasting between 5-10 minutes.  It would  not be associated with exercise, but occur at rest, but was described as  pressure or heaviness.  Today, she had a severe episode of substernal  tightness lasting around 1 hour with some radiation up into her neck.  The pain resolved spontaneously and she was brought to the emergency  room.  Initial EKG was unremarkable, but troponin returned abnormal at  0.12, and she was brought in the hospital for evaluation of possible  unstable angina.  She has some history of arthritis and uses eye drops  at home for glaucoma.  She evidently has been intolerant to Lipitor  previously in the past.   PAST MEDICAL HISTORY:  Medical:  1. Hypertension.  2. Esophageal reflux.  3. Hiatal hernia.  4. History of diverticulosis.  5. History of bladder spasms.  6. Hyperlipidemia known previously.   PREVIOUS SURGERY:  Hernia repair.   ALLERGIES:  INTOLERANT TO LIPITOR.   CURRENT MEDICATIONS:  Aspirin daily.  She takes evidently Welchol,  Hydrocodone.  She takes a fluid pill 3 days a week and takes an unknown  medicine  for bladder spasms.   SOCIAL HISTORY:  She is a widow.  She has two daughters that live [place]of-  town, one is an Pensions consultant and one works in Science writer.  She has a  friend that is with her that is like a third daughter.  She is a  nonsmoker, does not use alcohol to excess, currently maintains her own  home.  She is active at [employer].   FAMILY HISTORY:  She was raised by an aunt and her parents died when she  was young of unknown cause.  A brother died several years ago of fat in  his blood stream.  There is no family history of premature cardiac  disease.   REVIEW OF SYSTEMS:  She has been obese, relatively active.  No skin  disorders or problems.  She has a history of cataract extraction, has  glaucoma and uses eye drops.  She has no ear, nose or throat problems.  She has a history of diverticulosis and has had some episodic abdominal  pain and some episodic diarrhea.  She has a  history of bladder frequency  and has had some procedures done on her bladder in the past, including  cystoscopy.  The exact nature of her urologic disease is unknown.  She  complains of some arthritis involving her shoulder and neck.  Also, she  takes hydrocodone for unknown reasons.  She has no history of stroke or  TIA or headaches.   PHYSICAL EXAMINATION:  GENERAL:  She is a pleasant elderly black female  in no acute distress.  She appears younger than stated age.  VITAL SIGNS:  Her blood pressure is 110/70, pulse is 70 and regular.  SKIN:  Warm and dry.  HEENT:  EOMI.  PERRLA.  CNS clear.  Pharynx negative.  NECK:  Supple without masses, JVD or thyromegaly.  No bruits.  LUNGS:  Clear to A&P.  CARDIOVASCULAR:  Normal S1-S2.  No S3 or murmur.  ABDOMEN:  Soft and nontender.  EXTREMITIES:  Her femoral pulses are 2+.  Peripheral pulses are  diminished and somewhat difficult to feel.  NEUROLOGIC:  Normal.   Electrocardiogram showed minor nonspecific changes in the high lateral   leads, otherwise unremarkable.   LABORATORY DATA:  Shows normal CBC.  Normal PT and PTT.  D-dimer is  0.54, BUN is 25, creatinine 1.44.  Troponin is 0.12.  CPK-MB is 6.3.   IMPRESSION:  1. Prolonged chest discomfort with abnormal troponin, consistent with      unstable angina.  2. Hypertension.  3. Hyperlipidemia with history of intolerance to Lipitor.  4. Bladder dysfunction of uncertain cause.  5. History of hypertension.   RECOMMENDATIONS:  The patient will be admitted to the hospital and  placed on IV heparin.  Serial enzymes will be obtained.  Serial EKGs  will be obtained.  If she has abnormal enzymes, may need to have  catheterization to evaluate recent chest pain that she is having.      Georga Hacking, M.D.  Electronically Signed     WST/MEDQ  D:  03/15/2008  T:  03/15/2008  Job:  [id number]   cc:   Mina Marble, M.D.

## 2010-12-05 NOTE — Op Note (Signed)
[name], [name]             ACCOUNT NO.:  [id number]   MEDICAL RECORD NO.:  [id number]          PATIENT TYPE:  INP   LOCATION:  1344                         [facility]ACILITY[facility]   PHYSICIAN:  Georgiana Spinner, M.D.    DATE OF BIRTH:  April 06, 1921   DATE OF PROCEDURE:  06/04/2008  DATE OF DISCHARGE:                               OPERATIVE REPORT   PROCEDURE:  Colonoscopy.   INDICATIONS:  Abdominal pain.   ANESTHESIA:  Fentanyl 50 mcg, Versed 5 mg.   PROCEDURE:  With the patient mildly sedated in the left lateral  decubitus position, the Pentax videoscopic pediatric colonoscope was  inserted into the rectum and passed under direct vision through a  diverticula filled sigmoid colon to reach the cecum identified by  ileocecal valve and appendiceal orifice, both which were photographed.  From this point the colonoscope was slowly withdrawn taking  circumferential views of colonic mucosa stopping only in the sigmoid  colon where diverticula were noted of a moderate degree until we reached  the rectum which appeared normal on direct and showed hemorrhoids on  retroflexed view.  The endoscope was straightened and withdrawn.  The  patient's vital signs, pulse oximeter remained stable.  The patient  tolerated procedure well without apparent complications.   FINDINGS:  Internal hemorrhoids, moderate diverticulosis of sigmoid  colon, otherwise unremarkable exam.   PLAN:  Consider repeat examination in 5-10 years           ______________________________  Georgiana Spinner, M.D.     GMO/MEDQ  D:  06/04/2008  T:  06/04/2008  Job:  [id number]

## 2010-12-05 NOTE — Cardiovascular Report (Signed)
NAMESHARRA, CAYABYAB             ACCOUNT NO.:  [id number]   MEDICAL RECORD NO.:  [id number]          PATIENT TYPE:  INP   LOCATION:  3736                         FACILITY:  [facility]   PHYSICIAN:  Georga Hacking, M.D.DATE OF BIRTH:  1921/03/16   DATE OF PROCEDURE:  03/16/2008  DATE OF DISCHARGE:                            CARDIAC CATHETERIZATION   HISTORY:  The patient is an 77 year old female who presented with  prolonged chest discomfort and had abnormal troponins.  EKG was  unremarkable.  She has long-standing hypertension as well as  hyperlipidemia.   PROCEDURE:  Left heart catheterization with coronary angiograms and left  ventriculogram.   PROCEDURE:  The patient was brought to the cath lab and was prepped and  draped in the usual manner.  After Xylocaine anesthesia, a 6-French  sheath was placed in right femoral artery percutaneously with single 6-  [place] stick with a single anterior needle wall stick.  Catheters used  were 6-French and a 30 mL ventriculogram was performed.  On pullback  across the LV outflow tract gradient, there was a post PVC compensatory  increase in the left ventricular cavity pressures suggestive of an  intracavitary obstruction.  She tolerated the procedure well and sheath  was removed in the holding area.   Hemodynamic data:  Aorta postcontrast 155/80, LV postcontrast 155/18-20.   Angiographic data:  Left ventriculogram:  Performed in the 30 degrees  RAO projection.  The aortic valve is normal.  Mitral valve is normal.  Left ventricular function appears be hyperdynamic with an estimated  ejection fraction of 75%-80%.  Coronary arteries:  Arise and distribute  normally.  Minimal calcification noted.  Left main coronary artery is  normal.  Left anterior descending is a large vessel and is tortuous, but  contains minimal irregularity, but no significant obstruction or  stenosis noted.  Circumflex coronary artery is a large vessel with two  marginal  arteries with no significant obstructive disease noted.  The  right coronary artery, posterior descending, and posterolateral branch  has somewhat small caliber, but contained no significant obstructive  stenosis noted.  There is calcification noted in the aorta just above  the coronary ostium.   IMPRESSION:  1. Hyperdynamic left ventricular function with probable intracavitary      left ventricular gradient demonstrated blood pressure data.  No      definite outflow obstruction noted at rest, however.  2. Tortuous coronary arteries with irregularity, but no significant      obstructive stenosis noted.   RECOMMENDATIONS:  Obtain echocardiogram and medical treatment at this  time.      Georga Hacking, M.D.  Electronically Signed    WST/MEDQ  D:  03/16/2008  T:  03/17/2008  Job:  [id number]   cc:   Rozann Lesches

## 2010-12-05 NOTE — Op Note (Signed)
[name], CUMBO             ACCOUNT NO.:  [id number]   MEDICAL RECORD NO.:  [id number]          PATIENT TYPE:  AMB   LOCATION:  ENDO                         [facility]ACILITY:  [facility]   PHYSICIAN:  Georgiana Spinner, M.D.    DATE OF BIRTH:  07/13/1921   DATE OF PROCEDURE:  05/10/2008  DATE OF DISCHARGE:                               OPERATIVE REPORT   PROCEDURE:  Upper endoscopy with biopsy.   INDICATIONS:  Abdominal pain.   ANESTHESIA:  Fentanyl 25 mcg, Versed 2 mg.   PROCEDURE:  With the patient mildly sedated in the left lateral  decubitus position, the Pentax videoscopic endoscope was inserted mouth  passed under direct vision through the esophagus which appeared normal.  There was a small hiatal hernia seen.  We entered into the stomach  through this.  Fundus, body, antrum appeared normal.  However, duodenal  bulb showed a small polyp that was photographed and biopsied, second  portion of duodenum appeared normal.  From this point the endoscope was  slowly withdrawn taking circumferential views of duodenal mucosa until  the endoscope been pulled back into stomach and placed in retroflexion  to view the stomach from below.  The endoscope was then straightened and  withdrawn taking circumferential views of the remaining gastric and  esophageal mucosa.  The patient's vital signs, pulse oximeter remained  stable.  The patient tolerated procedure well without apparent  complications.   FINDINGS:  Small duodenal polyp, otherwise an unremarkable examination.   PLAN:  Will start the patient on Prilosec 20 mg daily and will have the  patient call me for results of biopsy and follow-up with me as an  outpatient to assess how she responds to PPI therapy for this abdominal  pain.           ______________________________  Georgiana Spinner, M.D.     GMO/MEDQ  D:  05/10/2008  T:  05/10/2008  Job:  [id number]   cc:   Mina Marble, M.D.  Fax: [phone]

## 2010-12-08 NOTE — Op Note (Signed)
[facility]  Patient:    [name], [name]                    MRN: [id number] Proc. Date: 01/02/00 Adm. Date:  [id number] Disc. Date: [id number] Attending:  Lindaann Slough                           Operative Report  PREOPERATIVE DIAGNOSIS:  ______ ureteral syndrome.  POSTOPERATIVE DIAGNOSIS:  ______ ureteral syndrome.  PROCEDURE PERFORMED:  Cystoscopy and urethral dilation.  SURGEON:  Lindaann Slough, M.D.  ANESTHESIA:  General.  INDICATIONS:  The patient is a 77 year old female who has been complaining of frequency, suprapubic discomfort, and dysuria.  She was treated with antibiotics without any improvement.  She is scheduled for cystoscopy.  DESCRIPTION OF PROCEDURE:  Under general anesthesia, the patient was prepped and draped, and placed in the dorsal lithotomy position.  A #22 Wappler cystoscope was inserted in the bladder.  The bladder mucosa is normal.  There is no stone or tumor in the bladder.  The ureteral orifices are in normal position and shape with clear efflux.  There is no evidence of submucosal hemorrhage.  The cystoscope was then removed.  The urethra was dilated with a #32-French.  The patient tolerated the procedure well and left the OR in satisfactory condition to the postanesthesia care unit. DD:  01/02/00 TD:  01/04/00 Job: [id number]

## 2010-12-08 NOTE — Op Note (Signed)
[name], [name]             ACCOUNT NO.:  [id number]   MEDICAL RECORD NO.:  [id number]          PATIENT TYPE:  AMB   LOCATION:  ENDO                         FACILITY:  [facility]   PHYSICIAN:  Georgiana Spinner, M.D.    DATE OF BIRTH:  07/20/1920   DATE OF PROCEDURE:  12/10/2005  DATE OF DISCHARGE:                                 OPERATIVE REPORT   PROCEDURE:  Upper endoscopy.   INDICATIONS:  GERD.   ANESTHESIA:  Demerol 20 and  Versed 3 mg.   PROCEDURE:  With the patient mildly sedated in the left lateral decubitus  position, the Olympus videoscopic endoscope was inserted in the mouth and  passed under direct vision through the esophagus which appeared normal until  we reached distal esophagus and we biopsied the squamocolumnar junction area  to rule out Barrett's.  We entered into the stomach.  Fundus, body, antrum,  duodenal bulb, second portion duodenum were visualized.  From this point,  the endoscope was slowly withdrawn taking circumferential views of duodenal  mucosa until the endoscope had been pulled back in the stomach, placed in  retroflexion to view the stomach from below.  The endoscope was straightened  and withdrawn, taking circumferential views of the remaining gastric and  esophageal mucosa.  The patient's vital signs and pulse oximeter remained  stable.  The patient tolerated procedure well with no apparent  complications.   FINDINGS:  Question of Barrett's esophagus, hiatal hernia, await biopsy  report.  The patient will call me for results and follow-up with me as an  outpatient.           ______________________________  Georgiana Spinner, M.D.     GMO/MEDQ  D:  12/10/2005  T:  12/10/2005  Job:  [id number]   cc:   Mina Marble, M.D.  Fax: [phone]

## 2010-12-08 NOTE — Discharge Summary (Signed)
NAMEJENELLE, [name]             ACCOUNT NO.:  [id number]   MEDICAL RECORD NO.:  [id number]          PATIENT TYPE:  INP   LOCATION:  1344                         FACILITY:  [facility]   PHYSICIAN:  Mina Marble, M.D.DATE OF BIRTH:  10/15/1920   DATE OF ADMISSION:  05/28/2008  DATE OF DISCHARGE:  06/04/2008                               DISCHARGE SUMMARY   DISCHARGE DIAGNOSES:  1. Pancreatic pseudocyst mass with abdominal pain.  2. Pure hypercholesterolism.  3. Dehydration treated/improved with intravenous fluids.  4. Nausea, probably related to pancreatic cystic mass.  5. Hypertensive heart disease.  6. Esophageal reflux disease.  7. Renal insufficiency, mild.  8. Abdominal pain, probably related to inflamed pseudocyst, improved.  9. Constipation.  10.Anxiety and depression, mild.   BRIEF HISTORY:  The patient is an 77 year old, African American female  with the complaint of increasing nausea and mid-abdominal pain, and  anorexia for 3-4 months prior to admission, but became worse the week of  admission with increasing mid-abdominal pain and unable to swallow even  liquids without regurgitation.  The patient also started having falls  without assistance and was admitted from the [facility] emergency department for further evaluation.  The patient has  been evaluated by her gastroenterologist without improvement in the  medications prescribed for acid reflux and question of diverticulitis.  She has had course of Cipro and pain meds, but had no relief from the  symptoms.  The patient did have an abdominal CT scan the day of  admission on May 28, 2008, which revealed a mass in the head of the  pancreas in the uncinate area, and was admitted for further evaluation.  She also had changes of dehydration and received IV fluids.   PAST HISTORY:  Revealed the patient to have a long history of  hypertension as well as hypercholesterolemia.   SOCIAL HISTORY:   The patient lives alone and denies a history of smoking  or alcohol use.   FAMILY HISTORY:  Noncontributory.   REVIEW OF SYSTEMS:  Reveals the head, eyes, ears, nose, and throat,  cardiopulmonary, GI, and neuromuscular systems to be as noted.   ALLERGIES:  The patient has no known drug allergies.   BRIEF PHYSICAL FINDINGS:  GENERAL:  The patient was observed on  admission on May 28, 2008, to be a well-developed, well-nourished,  weak-appearing, black female, being oriented to time, place, and person.  VITAL SIGNS:  Revealed a temperature of 98.0, pulse 88, respirations 20,  and blood pressure of 128/46.  HEAD, EYES, EARS, NOSE, AND THROAT:  Revealed the patient be alert and  coherent.  The eyes revealed the pupils to be equal, round, and reactive  to light.  NECK:  Supple with no masses or thyroid enlargement.  LUNGS:  Clear to auscultation.  ABDOMEN:  Revealed mild obesity and minimal epigastric tenderness, but  no masses or organomegaly felt.  GENITALIA:  Revealed normal external female genitalia.  EXTREMITIES:  Revealed mild degenerative joint disease of the knees.   COMMENT:  The abdominal CT scan revealed a pancreatic mass approximately  4 cm in size  in the uncinate head of the pancreas area.  Labs revealed  an increased serum creatinine to 1.640 mg percent.   HOSPITAL COURSE:  The patient's hospital course was one of gradual  improvement with the use of Zofran 4 mg IV every 4 hours p.r.n. for the  nausea.  She was placed on a full liquid diet as well as IV fluids for  hydration.  She was placed on Lovenox for deep venous thrombosis  prophylaxis when not receiving procedures.  The serum amylase and  carcinoembryonic antigen tests were normal.  The patient also used  heparin 5000 units subcu q.8 h. for deep venous thrombosis prophylaxis  when not receiving procedures.  The patient also used lactulose to  relieve her constipation and it appears that after her  constipation  improved, her nausea also improved.   The patient had received small bowel endoscopy by Rob Bunting as well  as ultrasound.  The patient also received needle aspiration on June 01, 2008, which did not reveal any adenocarcinoma, only normal  pancreatic cells.  The patient also was not given hydrocodone with  acetaminophen in case this was contributing to her nausea.  She was  continued on simvastatin to help her hypercholesterolism initially at 20  mg q.h.s., but increased at discharge to 40 mg every night.  The patient  received during her hospital course, initially, morphine 2 mg IV q.4 h  p.r.n. for severe pain, but this had to be used only on the day of  admission.  She also was given Mylanta 30 cc p.o. q.h.s. p.r.n. for  epigastric discomfort and bowel gas.  She was continued on amlodipine 5  mg tab daily for her hypertension.  She gradually improved and was  discharged on June 04, 2008.   DISCHARGE MEDICATIONS:  1. Amlodipine 5 mg p.o. daily.  2. Elmiron 100 mg twice daily for her bladder.  3. Nitroglycerin 0.4 mg sublingual as needed for substernal chest      pain.  4. Simvastatin 40 mg tabs every night for her cholesterol.  5. Ativan or lorazepam 1 mg tab q.h.s. for rest.  6. Lactulose 15 cc at bedtime p.r.n. for stool softener.  7. Mylanta 30 cc p.o. q.h.s. p.r.n. for epigastric discomfort and      bowel gas.  8. Tylenol 325 mg tabs 1-2 q.6 h. p.r.n. for pain.  9. To discontinue the hydrocodone and aspirin 81 mg tab p.o. daily      with Mylanta.  10.She was to use Prilosec 20 mg tabs daily for acid reflux.   DISCHARGE INSTRUCTIONS:  The patient's upper GI endoscopy on May 10, 2008, by Sabino Gasser, MD, revealed a duodenal polyp with polypoid  duodenal mucosa and Brunner gland hyperplasia, and no malignancy.  The  patient was to have followup in my office in December for further  evaluation.   Her diet was low-sodium, low-fat, low-cholesterol,  heart-healthy diet.   She is to follow-up with her gastroenterologist as recommended.      Mina Marble, M.D.  Electronically Signed     GRK/MEDQ  D:  07/29/2008  T:  07/29/2008  Job:  [id number]   cc:   [name] Spinner, M.D.  Fax: [phone]

## 2010-12-08 NOTE — Op Note (Signed)
[name], [name]             ACCOUNT NO.:  [id number]   MEDICAL RECORD NO.:  [id number]          PATIENT TYPE:  AMB   LOCATION:  ENDO                         FACILITY:  [facility]   PHYSICIAN:  Georgiana Spinner, M.D.    DATE OF BIRTH:  1921-05-10   DATE OF PROCEDURE:  09/26/2004  DATE OF DISCHARGE:                                 OPERATIVE REPORT   PROCEDURE PERFORMED:  Colonoscopy.   ENDOSCOPIST:  Georgiana Spinner, M.D.   INDICATIONS FOR PROCEDURE:  Abdominal discomfort, colon cancer screening,  rectal bleeding.   ANESTHESIA:  None further given.   DESCRIPTION OF PROCEDURE:  With the patient mildly sedated in the left  lateral decubitus position, the Olympus videoscopic colonoscope was inserted  in the rectum and passed under direct vision to the cecum, identified by the  ileocecal valve and appendiceal orifice, both of which were photographed.  From this point the colonoscope was slowly withdrawn taking circumferential  views of the colonic mucosa stopping to photograph diverticula along the way  until we reached the rectum which appeared normal on direct and showed  hemorrhoids on retroflex view.  The endoscope was straightened and  withdrawn.  The patient's vital signs and pulse oximeter remained stable.  The patient tolerated the procedure well without apparent complications.   FINDINGS:  Diverticulosis of the sigmoid colon.  Internal hemorrhoids.  Otherwise unremarkable examination.   PLAN:  See endoscopy note for further details.      GMO/MEDQ  D:  09/26/2004  T:  09/26/2004  Job:  [id number]   cc:   Eino Farber., M.D.  [address] [place]  [place] [address]  Fax: [phone]

## 2010-12-08 NOTE — Op Note (Signed)
NAMEBERNADENE, [name]             ACCOUNT NO.:  [id number]   MEDICAL RECORD NO.:  [id number]          PATIENT TYPE:  AMB   LOCATION:  ENDO                         FACILITY:  [facility]   PHYSICIAN:  Georgiana Spinner, M.D.    DATE OF BIRTH:  1921/04/13   DATE OF PROCEDURE:  09/26/2004  DATE OF DISCHARGE:                                 OPERATIVE REPORT   PROCEDURE PERFORMED:  Upper endoscopy.   ENDOSCOPIST:  Georgiana Spinner, M.D.   DESCRIPTION OF PROCEDURE:  With the patient mildly sedated in the left  lateral decubitus position, the Olympus videoscopic endoscope was inserted  in the mouth and passed under direct vision through the esophagus which  appeared normal, into the stomach. The gastroesophageal junction did appear  to be somewhat inflamed and we biopsied it.  There appeared to be maybe a  small ulcer there.  The fundus appeared normal.  The body showed changes of  thickening and this was photographed and biopsied.  We entered into the  antrum, duodenal bulb and second portion of the duodenum all of which  appeared normal.  From this point, the endoscope was slowly withdrawn taking  circumferential views of the duodenal mucosa until the endoscope was pulled  back into the stomach and placed on retroflexion to view the stomach from  below.  The endoscope was then straightened and withdrawn taking  circumferential views of the remaining gastric and esophageal mucosa.  The  patient's vital signs and pulse oximeter remained stable.  The patient  tolerated the procedure well without apparent complications.   FINDINGS:  Ulcer of the body of the stomach, biopsied.  Ulcer of distal  esophagus, biopsied.  Await biopsy report.  The patient will call me for  results and follow up with me as an outpatient.  Proceed to colonoscopy.      GMO/MEDQ  D:  09/26/2004  T:  09/26/2004  Job:  [id number]   cc:   Eino Farber., M.D.  [address] [place]  [place] [address]  Fax: [phone]

## 2010-12-08 NOTE — Op Note (Signed)
[facility]  Patient:    KAMEKO, HUKILL                    MRN: [id number] Proc. Date: 04/11/00 Adm. Date:  [id number] Attending:  Pleas Koch CC:         Lindaann Slough, M.D.   Operative Report  PREOPERATIVE DIAGNOSES:       1. Fibroid uterus.                               2. Endometrial mass.  POSTOPERATIVE DIAGNOSES:      1. Fibroid uterus.                               2. Endometrial mass.  OPERATION:                    Hysteroscopy with polypectomy with                               resectoscope and D&C.  SURGEON:                      Georgina Peer, M.D.  ASSISTANT:  ANESTHESIA:                   General anesthesia plus 0.25%                               Marcaine paracervical block.  ESTIMATED BLOOD LOSS:         Less than 25 cc.  FLUID DEFICIT:                100 cc.  COMPLICATIONS:                None.  FINDINGS:                     A 5 mm polypoid mass on the posterior uterine                               wall, otherwise normal findings.  INDICATIONS:                  This is a 77 year old black female who when worked up for abdominal pain, fibroid uterus was found but also an endometrial mass.  An endometrial biopsy in the office revealed insufficient material for diagnosis and a sonohistogram attempt was unsuccessful because of the patients discomfort.  The patient was brought in for evaluation and possible removal of this mass.  She was aware of risks and complications of procedure including bleeding, infection, and possible cervix or uterine damage, anesthetic interactions and fluid overload.  She was willing to proceed.  DESCRIPTION OF PROCEDURE:     The patient was taken to the operating room and given a general anesthetic, placed in the dorsal lithotomy position.  The vagina and perineum were sterilely prepped and draped and the bladder was emptied with the catheter. The uterus was _______ irregular  consistent with known small fibroids with no adnexal masses.  A speculum placed in the vagina and cervix grasped with a tenaculum. Paracervical block with 0.25% Marcaine, total  of 11 cc was injected.  Cervix was then easily dilated after a small amount of initial cervical stenosis was easily overcome with a gentle pressure from the dilators. The patient was dilated to a #25 [place] with [facility] dilators, endometrial cavity was 8 cm in depth.  Diagnostic hysteroscope was placed in the cervix and the cervix appeared normal.  The uterine fundus appeared normal and the endometrium appeared thin and pale.  There was a 5 mm polypoid structure on the posterior wall in the mid uterine body which would account for the ultrasound findings.  The diagnostic scope was removed. The cervix dilated to a 78 [place] and an operative hysteroscope with a resectoscopic wire at 90 degrees was used to complete resect the endometrial mass.  This was sent as a separate specimen.  A curettage was then performed and sent as a separate specimen with minimal tissue obtained.  There was minimal bleeding.  Sponge, needle and instrument counts were correct.  The fluid deficit was 100 cc.  The patient tolerated the procedure well and was sent to the recovery room in stable condition. DD:  04/11/00 TD:  04/12/00 Job: [id number]

## 2011-04-23 LAB — POCT I-STAT, CHEM 8
BUN: 36 — ABNORMAL HIGH
Calcium, Ion: 1.2
Chloride: 106
HCT: 39
Potassium: 4.4

## 2011-04-23 LAB — URINE MICROSCOPIC-ADD ON

## 2011-04-23 LAB — CBC
HCT: 38.1
MCV: 92
Platelets: 262
RDW: 14.5

## 2011-04-23 LAB — URINALYSIS, ROUTINE W REFLEX MICROSCOPIC
Glucose, UA: NEGATIVE
Ketones, ur: NEGATIVE
Nitrite: NEGATIVE
Specific Gravity, Urine: 1.025
pH: 5.5

## 2011-04-23 LAB — COMPREHENSIVE METABOLIC PANEL
Albumin: 4.2
BUN: 33 — ABNORMAL HIGH
Creatinine, Ser: 1.66 — ABNORMAL HIGH
Glucose, Bld: 113 — ABNORMAL HIGH
Total Bilirubin: 0.6
Total Protein: 7.8

## 2011-04-23 LAB — DIFFERENTIAL
Basophils Absolute: 0
Lymphocytes Relative: 24
Monocytes Absolute: 0.4
Neutro Abs: 3.8
Neutrophils Relative %: 66

## 2011-04-24 LAB — HEPATIC FUNCTION PANEL
Albumin: 4.1
Alkaline Phosphatase: 56
Bilirubin, Direct: 0.1
Total Bilirubin: 0.7

## 2011-04-24 LAB — CBC
HCT: 35.3 — ABNORMAL LOW
HCT: 39.1
Hemoglobin: 11.8 — ABNORMAL LOW
Hemoglobin: 12.9
MCHC: 33.3
MCHC: 33
MCV: 92.3
MCV: 93.4
RBC: 3.82 — ABNORMAL LOW
RBC: 4.19
RDW: 15.3
WBC: 5.3

## 2011-04-24 LAB — DIFFERENTIAL
Basophils Relative: 1
Eosinophils Absolute: 0.1
Eosinophils Absolute: 0
Eosinophils Relative: 1
Lymphs Abs: 1.4
Lymphs Abs: 0.9
Monocytes Absolute: 0.4
Monocytes Relative: 9
Monocytes Relative: 7
Neutro Abs: 3.1
Neutrophils Relative %: 60
Neutrophils Relative %: 75

## 2011-04-24 LAB — LIPID PANEL
LDL Cholesterol: 93
Total CHOL/HDL Ratio: 5.1
VLDL: 29

## 2011-04-24 LAB — PROTIME-INR
INR: 1
Prothrombin Time: 13.1

## 2011-04-24 LAB — COMPREHENSIVE METABOLIC PANEL
ALT: 14
BUN: 23
CO2: 23
Calcium: 9.1
GFR calc non Af Amer: 35 — ABNORMAL LOW
Glucose, Bld: 108 — ABNORMAL HIGH
Sodium: 133 — ABNORMAL LOW
Total Protein: 6.7

## 2011-04-24 LAB — LIPASE, BLOOD
Lipase: 24
Lipase: 19

## 2011-04-24 LAB — BASIC METABOLIC PANEL
BUN: 21
CO2: 24
Calcium: 9
Calcium: 9.9
Chloride: 102
Creatinine, Ser: 1.16
Creatinine, Ser: 1.64 — ABNORMAL HIGH
GFR calc Af Amer: 54 — ABNORMAL LOW
GFR calc non Af Amer: 44 — ABNORMAL LOW
Sodium: 142

## 2011-04-24 LAB — URINALYSIS, ROUTINE W REFLEX MICROSCOPIC
Bilirubin Urine: NEGATIVE
Glucose, UA: NEGATIVE
Ketones, ur: NEGATIVE
Ketones, ur: NEGATIVE
Nitrite: NEGATIVE
Nitrite: NEGATIVE
Protein, ur: NEGATIVE
Protein, ur: NEGATIVE
Urobilinogen, UA: 0.2
pH: 5.5

## 2011-04-24 LAB — CANCER ANTIGEN 19-9: CA 19-9: 3.1 — ABNORMAL LOW (ref <35.0)

## 2011-04-24 LAB — CK: Total CK: 35

## 2011-04-24 LAB — APTT: aPTT: 29

## 2011-04-24 LAB — SEDIMENTATION RATE: Sed Rate: 27 — ABNORMAL HIGH

## 2011-04-24 LAB — CEA (CARCINOEMBRYONIC ANTIGEN), FLUID: CEA Fluid: 82.5 not reported

## 2011-04-24 LAB — AMYLASE: Amylase: 44

## 2011-04-24 LAB — CEA: CEA: 1.1

## 2011-04-25 LAB — DIFFERENTIAL
Eosinophils Relative: 2
Lymphocytes Relative: 20
Monocytes Absolute: 0.4
Monocytes Relative: 8
Neutro Abs: 3

## 2011-04-25 LAB — URINALYSIS, ROUTINE W REFLEX MICROSCOPIC
Glucose, UA: NEGATIVE
Hgb urine dipstick: NEGATIVE
Specific Gravity, Urine: 1.029

## 2011-04-25 LAB — COMPREHENSIVE METABOLIC PANEL
AST: 18
Albumin: 3.7
Alkaline Phosphatase: 49
BUN: 20
Creatinine, Ser: 1.57 — ABNORMAL HIGH
GFR calc Af Amer: 38 — ABNORMAL LOW
Potassium: 4
Total Protein: 6.2

## 2011-04-25 LAB — URINE MICROSCOPIC-ADD ON

## 2011-04-25 LAB — CBC
HCT: 34.9 — ABNORMAL LOW
Platelets: 258
RDW: 14.9
WBC: 4.3

## 2011-05-08 LAB — COMPREHENSIVE METABOLIC PANEL
Albumin: 3.8
Alkaline Phosphatase: 62
BUN: 32 — ABNORMAL HIGH
GFR calc Af Amer: 41 — ABNORMAL LOW
Potassium: 4.4
Sodium: 137
Total Protein: 7.5

## 2011-05-08 LAB — CBC
HCT: 38.3
Platelets: 266
RDW: 14.5 — ABNORMAL HIGH

## 2011-05-08 LAB — DIFFERENTIAL
Basophils Relative: 1
Monocytes Absolute: 0.4
Monocytes Relative: 8
Neutro Abs: 3.1

## 2011-12-09 ENCOUNTER — Emergency Department (HOSPITAL_COMMUNITY)
Admission: EM | Admit: 2011-12-09 | Discharge: 2011-12-09 | Disposition: A | Discharge status: Home / Self Care | Payer: Medicare Other | Attending: Emergency Medicine | Admitting: Emergency Medicine

## 2011-12-09 ENCOUNTER — Encounter (HOSPITAL_COMMUNITY): Payer: Self-pay

## 2011-12-09 DIAGNOSIS — Z79899 Other long term (current) drug therapy: Secondary | ICD-10-CM | POA: Insufficient documentation

## 2011-12-09 DIAGNOSIS — R5381 Other malaise: Secondary | ICD-10-CM | POA: Insufficient documentation

## 2011-12-09 DIAGNOSIS — R42 Dizziness and giddiness: Secondary | ICD-10-CM | POA: Insufficient documentation

## 2011-12-09 DIAGNOSIS — R55 Syncope and collapse: Secondary | ICD-10-CM | POA: Insufficient documentation

## 2011-12-09 DIAGNOSIS — R531 Weakness: Secondary | ICD-10-CM

## 2011-12-09 HISTORY — DX: Bladder disorder, unspecified: N32.9

## 2011-12-09 LAB — COMPREHENSIVE METABOLIC PANEL
ALT: 10 U/L (ref 0–35)
Albumin: 4 g/dL (ref 3.5–5.2)
Alkaline Phosphatase: 74 U/L (ref 39–117)
Chloride: 97 mEq/L (ref 96–112)
Glucose, Bld: 106 mg/dL — ABNORMAL HIGH (ref 70–99)
Potassium: 4.8 mEq/L (ref 3.5–5.1)
Sodium: 133 mEq/L — ABNORMAL LOW (ref 135–145)
Total Bilirubin: 0.4 mg/dL (ref 0.3–1.2)
Total Protein: 7.8 g/dL (ref 6.0–8.3)

## 2011-12-09 LAB — URINALYSIS, ROUTINE W REFLEX MICROSCOPIC
Bilirubin Urine: NEGATIVE
Hgb urine dipstick: NEGATIVE
Ketones, ur: NEGATIVE mg/dL
Specific Gravity, Urine: 1.023 (ref 1.005–1.030)
pH: 7.5 (ref 5.0–8.0)

## 2011-12-09 LAB — CBC
HCT: 39.9 % (ref 36.0–46.0)
Hemoglobin: 13.1 g/dL (ref 12.0–15.0)
MCHC: 32.8 g/dL (ref 30.0–36.0)
RDW: 14.3 % (ref 11.5–15.5)
WBC: 6.5 10*3/uL (ref 4.0–10.5)

## 2011-12-09 LAB — URINE MICROSCOPIC-ADD ON

## 2011-12-09 MED ORDER — CEPHALEXIN 500 MG PO CAPS: 500.0000 mg | ORAL_CAPSULE | Freq: Four times a day (QID) | ORAL | Status: AC

## 2011-12-09 MED ORDER — CEPHALEXIN 500 MG PO CAPS
500.0000 mg | ORAL_CAPSULE | Freq: Once | ORAL | Status: AC
  Administered 2011-12-09: 500 mg via ORAL
  Filled 2011-12-09: qty 1

## 2011-12-09 NOTE — ED Notes (Signed)
Pt ambulated well without assistance.

## 2011-12-09 NOTE — ED Notes (Addendum)
ems called out for near syncope episode, lightheaded, nausea, was pale and diaphoretic, no LOC, pt alert, oriented, pt from church. Alert, oriented now, only complaint at the moment is weakness. Denied pain

## 2011-12-09 NOTE — Discharge Instructions (Signed)
Rest. Drink plenty of fluids, eat meals regularly. The lab work shows a possible urine infection. Take antibiotic as prescribed. A urine culture was sent the results of which will be back in 2 days - have your doctor follow up on those results then. Follow up with your primary care doctor in the next couple days for recheck.  Return to ER if worse, fevers, vomiting, trouble breathing, weak/faint, other concern.       Urinary Tract Infection Infections of the urinary tract can start in several places. A bladder infection (cystitis), a kidney infection (pyelonephritis), and a prostate infection (prostatitis) are different types of urinary tract infections (UTIs). They usually get better if treated with medicines (antibiotics) that kill germs. Take all the medicine until it is gone. You or your child may feel better in a few days, but TAKE ALL MEDICINE or the infection may not respond and may become more difficult to treat. HOME CARE INSTRUCTIONS   Drink enough water and fluids to keep the urine clear or pale yellow. Cranberry juice is especially recommended, in addition to large amounts of water.   Avoid caffeine, tea, and carbonated beverages. They tend to irritate the bladder.   Alcohol may irritate the prostate.   Only take over-the-counter or prescription medicines for pain, discomfort, or fever as directed by your caregiver.  To prevent further infections:  Empty the bladder often. Avoid holding urine for long periods of time.   After a bowel movement, women should cleanse from front to back. Use each tissue only once.   Empty the bladder before and after sexual intercourse.  FINDING OUT THE RESULTS OF YOUR TEST Not all test results are available during your visit. If your or your child's test results are not back during the visit, make an appointment with your caregiver to find out the results. Do not assume everything is normal if you have not heard from your caregiver or the medical  facility. It is important for you to follow up on all test results. SEEK MEDICAL CARE IF:   There is back pain.   Your baby is older than 3 months with a rectal temperature of 100.5 F (38.1 C) or higher for more than 1 day.   Your or your child's problems (symptoms) are no better in 3 days. Return sooner if you or your child is getting worse.  SEEK IMMEDIATE MEDICAL CARE IF:   There is severe back pain or lower abdominal pain.   You or your child develops chills.   You have a fever.   Your baby is older than 3 months with a rectal temperature of 102 F (38.9 C) or higher.   Your baby is 35 months old or younger with a rectal temperature of 100.4 F (38 C) or higher.   There is nausea or vomiting.   There is continued burning or discomfort with urination.  MAKE SURE YOU:   Understand these instructions.   Will watch your condition.   Will get help right away if you are not doing well or get worse.  Document Released: 04/18/2005 Document Revised: 06/28/2011 Document Reviewed: 11/21/2006 [facility] Patient Information 2012 [place], [place].   Weakness, Generalized Without Cause Your caregiver has seen you today because you are having problems with feelings of weakness. Weakness has many different causes, some of which are common and others are very rare. The causes of weakness are so numerous they could not all be listed on this page. The exam and other tests done  today do not reveal a specific cause for the weakness that is an immediate danger or something that is treatable. Your caregiver has checked you for the most common causes of weakness and feels it is safe for you to go home and be observed. HOME CARE INSTRUCTIONS   For the time being, obtain more rest if needed.   Eat a well balanced diet.   Try to get at least some exercise every day in spite of how difficult it may seem at times. In the case of the elderly, exercise is especially important. As we grow older, there  is a loss of muscle mass. Generally, there is also a loss of, or decrease in, activity that comes naturally with the aging process. Exercise and increased activities are the only tools we have to combat this natural process.   The results of some tests ordered today may not be available right away. You will be contacted with those results when they become available.   It is important to follow through with your physician as per instructions that you may have received today.  SEEK MEDICAL CARE IF:   You have any new concerns which you do not feel were dealt with today.   The weakness seems to be getting progressively worse.   You develop new or unusual aches or pains.  SEEK IMMEDIATE MEDICAL CARE IF:   You are unable to tend to your usual daily activities such as simply getting dressed, feeding yourself, or keeping up with your personal hygiene.   You develop inability to walk stairs or perform your usual daily activities.   You develop shortness of breath, chest pain, have difficulty moving parts of your body, or develop new problems for which you have not talked to your caregiver.   You experience difficulty speaking or swallowing.   You develop loss of control of bladder or bowels that was not present before.  Document Released: 07/09/2005 Document Revised: 06/28/2011 Document Reviewed: 12/19/2006 [facility] Patient Information 2012 [place], [place].

## 2011-12-09 NOTE — ED Provider Notes (Addendum)
History     CSN: [id number]  Arrival date & time 12/09/11  1355   First MD Initiated Contact with Patient 12/09/11 1448      Chief Complaint  Patient presents with  . Near Syncope    (Consider location/radiation/quality/duration/timing/severity/associated sxs/prior treatment) The history is provided by the patient.  pt states has felt generally weak for past 1-2 months. Today was at church, felt very weak in general, no focal or unilateral weakness. Felt lightheaded. No syncope. No assoc chest discomfort. No sob. No palpitations or irreg hr. No headache. No cough, sore throat, or uri c/o. No abd pain. No nvd. No gu c/o. States had taken her meds today on empty stomach, including hydrocodone which she takes for chronic interstitial cystitis. Had not eaten lunch. Denies other new meds suse or change in meds. No fever or chills.   Past Medical History  Diagnosis Date  . Bladder disease   . Cardiomyopathy     No past surgical history on file.  No family history on file.  History  Substance Use Topics  . Smoking status: Never Smoker   . Smokeless tobacco: Not on file  . Alcohol Use: No    OB History    Grav Para Term Preterm Abortions TAB SAB Ect Mult Living                  Review of Systems  Constitutional: Negative for fever and chills.  HENT: Negative for sore throat and neck pain.   Eyes: Negative for visual disturbance.  Respiratory: Negative for cough and shortness of breath.   Cardiovascular: Negative for chest pain.  Gastrointestinal: Negative for vomiting, abdominal pain, diarrhea and blood in stool.  Genitourinary: Negative for dysuria and flank pain.  Musculoskeletal: Negative for back pain.  Skin: Negative for rash.  Neurological: Negative for numbness and headaches.  Hematological: Does not bruise/bleed easily.  Psychiatric/Behavioral: Negative for confusion.    Allergies  Review of patient's allergies indicates no known allergies.  Home Medications    Current Outpatient Rx  Name Route Sig Dispense Refill  . AMLODIPINE BESYLATE 2.5 MG PO TABS Oral Take 2.5 mg by mouth daily.    Marland Kitchen BRIMONIDINE TARTRATE 0.1 % OP SOLN Both Eyes Place 1 drop into both eyes 2 (two) times daily.    . DORZOLAMIDE HCL-TIMOLOL MAL 22.3-6.8 MG/ML OP SOLN Both Eyes Place 1 drop into both eyes 2 (two) times daily.    . FUROSEMIDE 20 MG PO TABS Oral Take 20 mg by mouth daily.    Marland Kitchen HYDROCODONE-ACETAMINOPHEN 5-500 MG PO CAPS Oral Take 1 capsule by mouth See admin instructions. Takes 1 tablet in the morning  And 1 tablet at bedtime and may take 1 extra table during the day    . LORAZEPAM 1 MG PO TABS Oral Take 1 mg by mouth at bedtime.    Marland Kitchen MILNACIPRAN HCL 12.5 & 25 & 50 MG PO MISC Oral Take 1 tablet by mouth 2 (two) times daily.       BP 159/64  Pulse 81  Temp(Src) 98 F (36.7 C) (Oral)  Resp 18  SpO2 97%  Physical Exam  Nursing note and vitals reviewed. Constitutional: She is oriented to person, place, and time. She appears well-developed and well-nourished. No distress.  HENT:  Head: Atraumatic.  Eyes: Conjunctivae are normal. Pupils are equal, round, and reactive to light. No scleral icterus.  Neck: Normal range of motion. Neck supple. No tracheal deviation present. No thyromegaly present.  Cardiovascular:  Normal rate, regular rhythm, normal heart sounds and intact distal pulses.   Pulmonary/Chest: Effort normal and breath sounds normal. No respiratory distress.  Abdominal: Soft. Normal appearance and bowel sounds are normal. She exhibits no distension and no mass. There is no tenderness. There is no rebound and no guarding.  Genitourinary:       No cva tenderness  Musculoskeletal: She exhibits no edema and no tenderness.  Neurological: She is alert and oriented to person, place, and time. No cranial nerve deficit.       Motor intact bil.   Skin: Skin is warm and dry. No rash noted.  Psychiatric: She has a normal mood and affect.    ED Course    Procedures (including critical care time)   Labs Reviewed  CBC  COMPREHENSIVE METABOLIC PANEL  TROPONIN I  URINALYSIS, ROUTINE W REFLEX MICROSCOPIC   Results for orders placed during the hospital encounter of 12/09/11  CBC      Component Value Range   WBC 6.5  4.0 - 10.5 (K/uL)   RBC 4.37  3.87 - 5.11 (MIL/uL)   Hemoglobin 13.1  12.0 - 15.0 (g/dL)   HCT 16.1  09.6 - 04.5 (%)   MCV 91.3  78.0 - 100.0 (fL)   MCH 30.0  26.0 - 34.0 (pg)   MCHC 32.8  30.0 - 36.0 (g/dL)   RDW 40.9  81.1 - 91.4 (%)   Platelets 291  150 - 400 (K/uL)  COMPREHENSIVE METABOLIC PANEL      Component Value Range   Sodium 133 (*) 135 - 145 (mEq/L)   Potassium 4.8  3.5 - 5.1 (mEq/L)   Chloride 97  96 - 112 (mEq/L)   CO2 27  19 - 32 (mEq/L)   Glucose, Bld 106 (*) 70 - 99 (mg/dL)   BUN 25 (*) 6 - 23 (mg/dL)   Creatinine, Ser 7.82 (*) 0.50 - 1.10 (mg/dL)   Calcium 9.6  8.4 - 95.6 (mg/dL)   Total Protein 7.8  6.0 - 8.3 (g/dL)   Albumin 4.0  3.5 - 5.2 (g/dL)   AST 16  0 - 37 (U/L)   ALT 10  0 - 35 (U/L)   Alkaline Phosphatase 74  39 - 117 (U/L)   Total Bilirubin 0.4  0.3 - 1.2 (mg/dL)   GFR calc non Af Amer 37 (*) >90 (mL/min)   GFR calc Af Amer 43 (*) >90 (mL/min)  TROPONIN I      Component Value Range   Troponin I <0.30  <0.30 (ng/mL)  URINALYSIS, ROUTINE W REFLEX MICROSCOPIC      Component Value Range   Color, Urine YELLOW  YELLOW    APPearance CLEAR  CLEAR    Specific Gravity, Urine 1.023  1.005 - 1.030    pH 7.5  5.0 - 8.0    Glucose, UA NEGATIVE  NEGATIVE (mg/dL)   Hgb urine dipstick NEGATIVE  NEGATIVE    Bilirubin Urine NEGATIVE  NEGATIVE    Ketones, ur NEGATIVE  NEGATIVE (mg/dL)   Protein, ur NEGATIVE  NEGATIVE (mg/dL)   Urobilinogen, UA 0.2  0.0 - 1.0 (mg/dL)   Nitrite NEGATIVE  NEGATIVE    Leukocytes, UA MODERATE (*) NEGATIVE   URINE MICROSCOPIC-ADD ON      Component Value Range   Squamous Epithelial / LPF RARE  RARE    WBC, UA 3-6  <3 (WBC/hpf)   Bacteria, UA FEW (*) RARE        MDM  Labs.  Reviewed nursing notes  and prior charts for additional history.     ua w few bact, mod le, 3-6 wbc, will culture and rx pending cx.  Pt given meal tray, po fluids.  Ambulate in hall.    Recheck feels improved. No faintness or dizziness.     Suzi Roots, MD 12/09/11 1944  Suzi Roots, MD 12/09/11 2026

## 2011-12-09 NOTE — ED Notes (Signed)
ZOX:[id number]<AV> Expected date:<BR> Expected time: 1:41 PM<BR> Means of arrival:Ambulance<BR> Comments:<BR> M100 -- Near Syncopal

## 2011-12-09 NOTE — ED Notes (Signed)
Pt waiting for ride to d/c home.

## 2011-12-11 LAB — URINE CULTURE: Culture  Setup Time: 201305200217

## 2013-10-03 ENCOUNTER — Emergency Department (HOSPITAL_COMMUNITY): Payer: Medicare PPO

## 2013-10-03 ENCOUNTER — Emergency Department (HOSPITAL_COMMUNITY)
Admission: EM | Admit: 2013-10-03 | Discharge: 2013-10-03 | Disposition: A | Discharge status: Home / Self Care | Payer: Medicare PPO | Attending: Emergency Medicine | Admitting: Emergency Medicine

## 2013-10-03 DIAGNOSIS — S060XAA Concussion with loss of consciousness status unknown, initial encounter: Secondary | ICD-10-CM

## 2013-10-03 DIAGNOSIS — R5383 Other fatigue: Secondary | ICD-10-CM

## 2013-10-03 DIAGNOSIS — Z79899 Other long term (current) drug therapy: Secondary | ICD-10-CM | POA: Insufficient documentation

## 2013-10-03 DIAGNOSIS — S0993XA Unspecified injury of face, initial encounter: Secondary | ICD-10-CM | POA: Insufficient documentation

## 2013-10-03 DIAGNOSIS — S060X0A Concussion without loss of consciousness, initial encounter: Secondary | ICD-10-CM | POA: Insufficient documentation

## 2013-10-03 DIAGNOSIS — Y939 Activity, unspecified: Secondary | ICD-10-CM | POA: Insufficient documentation

## 2013-10-03 DIAGNOSIS — Z87448 Personal history of other diseases of urinary system: Secondary | ICD-10-CM | POA: Insufficient documentation

## 2013-10-03 DIAGNOSIS — S060X9A Concussion with loss of consciousness of unspecified duration, initial encounter: Secondary | ICD-10-CM

## 2013-10-03 DIAGNOSIS — R5381 Other malaise: Secondary | ICD-10-CM | POA: Insufficient documentation

## 2013-10-03 DIAGNOSIS — Z8679 Personal history of other diseases of the circulatory system: Secondary | ICD-10-CM | POA: Insufficient documentation

## 2013-10-03 DIAGNOSIS — I10 Essential (primary) hypertension: Secondary | ICD-10-CM | POA: Insufficient documentation

## 2013-10-03 DIAGNOSIS — Y929 Unspecified place or not applicable: Secondary | ICD-10-CM | POA: Insufficient documentation

## 2013-10-03 DIAGNOSIS — R296 Repeated falls: Secondary | ICD-10-CM | POA: Insufficient documentation

## 2013-10-03 DIAGNOSIS — S199XXA Unspecified injury of neck, initial encounter: Secondary | ICD-10-CM

## 2013-10-03 LAB — CBC WITH DIFFERENTIAL/PLATELET
BASOS PCT: 0 % (ref 0–1)
Basophils Absolute: 0 10*3/uL (ref 0.0–0.1)
EOS ABS: 0.2 10*3/uL (ref 0.0–0.7)
EOS PCT: 2 % (ref 0–5)
HCT: 38.5 % (ref 36.0–46.0)
Hemoglobin: 13 g/dL (ref 12.0–15.0)
LYMPHS ABS: 1.4 10*3/uL (ref 0.7–4.0)
Lymphocytes Relative: 20 % (ref 12–46)
MCH: 30.1 pg (ref 26.0–34.0)
MCHC: 33.8 g/dL (ref 30.0–36.0)
MCV: 89.1 fL (ref 78.0–100.0)
MONOS PCT: 4 % (ref 3–12)
Monocytes Absolute: 0.3 10*3/uL (ref 0.1–1.0)
NEUTROS PCT: 73 % (ref 43–77)
Neutro Abs: 5.1 10*3/uL (ref 1.7–7.7)
PLATELETS: 254 10*3/uL (ref 150–400)
RBC: 4.32 MIL/uL (ref 3.87–5.11)
RDW: 14.8 % (ref 11.5–15.5)
WBC: 7 10*3/uL (ref 4.0–10.5)

## 2013-10-03 LAB — URINALYSIS, ROUTINE W REFLEX MICROSCOPIC
Bilirubin Urine: NEGATIVE
Glucose, UA: NEGATIVE mg/dL
Hgb urine dipstick: NEGATIVE
Ketones, ur: NEGATIVE mg/dL
NITRITE: NEGATIVE
PROTEIN: NEGATIVE mg/dL
SPECIFIC GRAVITY, URINE: 1.01 (ref 1.005–1.030)
UROBILINOGEN UA: 0.2 mg/dL (ref 0.0–1.0)
pH: 5 (ref 5.0–8.0)

## 2013-10-03 LAB — BASIC METABOLIC PANEL
BUN: 20 mg/dL (ref 6–23)
CALCIUM: 9.9 mg/dL (ref 8.4–10.5)
CO2: 24 mEq/L (ref 19–32)
Chloride: 99 mEq/L (ref 96–112)
Creatinine, Ser: 1.22 mg/dL — ABNORMAL HIGH (ref 0.50–1.10)
GFR, EST AFRICAN AMERICAN: 44 mL/min — AB (ref >90)
GFR, EST NON AFRICAN AMERICAN: 38 mL/min — AB (ref >90)
GLUCOSE: 115 mg/dL — AB (ref 70–99)
POTASSIUM: 4.2 meq/L (ref 3.7–5.3)
Sodium: 138 mEq/L (ref 137–147)

## 2013-10-03 LAB — URINE MICROSCOPIC-ADD ON

## 2013-10-03 MED ORDER — METOCLOPRAMIDE HCL 5 MG/ML IJ SOLN: 10.0000 mg | Freq: Once | INTRAMUSCULAR | Status: DC

## 2013-10-03 MED ORDER — ACETAMINOPHEN 325 MG PO TABS
650.0000 mg | ORAL_TABLET | Freq: Once | ORAL | Status: AC
  Administered 2013-10-03: 650 mg via ORAL
  Filled 2013-10-03: qty 2

## 2013-10-03 MED ORDER — METOCLOPRAMIDE HCL 10 MG PO TABS: 10.0000 mg | ORAL_TABLET | Freq: Four times a day (QID) | ORAL | Status: DC | PRN

## 2013-10-03 MED ORDER — MECLIZINE HCL 12.5 MG PO TABS: 12.5000 mg | ORAL_TABLET | Freq: Three times a day (TID) | ORAL | Status: DC | PRN

## 2013-10-03 MED ORDER — METOCLOPRAMIDE HCL 5 MG/ML IJ SOLN
10.0000 mg | Freq: Once | INTRAMUSCULAR | Status: AC
  Administered 2013-10-03: 10 mg via INTRAMUSCULAR
  Filled 2013-10-03: qty 2

## 2013-10-03 MED ORDER — DIPHENHYDRAMINE HCL 50 MG/ML IJ SOLN: 25.0000 mg | Freq: Once | INTRAMUSCULAR | Status: DC

## 2013-10-03 NOTE — ED Notes (Signed)
Pt fell 2 weeks ago and family state that she still is having a headache intermit since fall. Alert x4, family states that is has increase weakness but is acting her normal.

## 2013-10-03 NOTE — ED Provider Notes (Signed)
CSN: [id number]     Arrival date & time 10/03/13  1901 History   First MD Initiated Contact with Patient 10/03/13 1952     Chief Complaint  Patient presents with  . Fall  . Headache     (Consider location/radiation/quality/duration/timing/severity/associated sxs/prior Treatment) The history is provided by the patient.  PATTRICIA WEIHER is a 80 y.o. female hx of cardiomyopathy, HTN, interstitial cystitis here with fall and headache. She had a mechanical fall 2 weeks ago. 3 days later she has intermittent headaches and neck pain then slowly got worse. She also had diffuse weakness that is progressively getting worse but is able to ambulate. Denies back pain or chest pain or abdominal pain. Denies syncope.    Past Medical History  Diagnosis Date  . Bladder disease   . Cardiomyopathy    No past surgical history on file. No family history on file. History  Substance Use Topics  . Smoking status: Never Smoker   . Smokeless tobacco: Not on file  . Alcohol Use: No   OB History   Grav Para Term Preterm Abortions TAB SAB Ect Mult Living                 Review of Systems  Neurological: Positive for headaches.  All other systems reviewed and are negative.      Allergies  Review of patient's allergies indicates no known allergies.  Home Medications   Current Outpatient Rx  Name  Route  Sig  Dispense  Refill  . amLODipine (NORVASC) 2.5 MG tablet   Oral   Take 2.5 mg by mouth daily.         . brimonidine (ALPHAGAN P) 0.1 % SOLN   Both Eyes   Place 1 drop into both eyes 2 (two) times daily.         . dorzolamide-timolol (COSOPT) 22.3-6.8 MG/ML ophthalmic solution   Both Eyes   Place 1 drop into both eyes 2 (two) times daily.         . furosemide (LASIX) 20 MG tablet   Oral   Take 20 mg by mouth daily.         . hydrocodone-acetaminophen (LORCET-HD) 5-500 MG per capsule   Oral   Take 1 capsule by mouth See admin instructions. Takes 1 tablet in the morning   And 1 tablet at bedtime and may take 1 extra table during the day         . LORazepam (ATIVAN) 1 MG tablet   Oral   Take 1 mg by mouth at bedtime.          BP 150/60  Pulse 71  Temp(Src) 97.6 F (36.4 C) (Oral)  Resp 20  Wt 145 lb (65.772 kg)  SpO2 96% Physical Exam  Nursing note and vitals reviewed. Constitutional: She is oriented to person, place, and time.  Chronically ill, uncomfortable   HENT:  Head: Normocephalic.  Mouth/Throat: Oropharynx is clear and moist.  ? Small posterior scalp hematoma   Eyes: Conjunctivae are normal. Pupils are equal, round, and reactive to light.  Neck: Normal range of motion. Neck supple.  Cardiovascular: Normal rate, regular rhythm and normal heart sounds.   Pulmonary/Chest: Effort normal and breath sounds normal. No respiratory distress. She has no wheezes. She has no rales.  Abdominal: Soft. Bowel sounds are normal. She exhibits no distension. There is no tenderness. There is no rebound and no guarding.  Musculoskeletal: Normal range of motion.  No obvious extremity trauma. Nl ROM  bilateral arm and legs   Neurological: She is alert and oriented to person, place, and time.  4/5 strength throughout   Skin: Skin is warm and dry.  Psychiatric: She has a normal mood and affect. Her behavior is normal. Judgment and thought content normal.    ED Course  Procedures (including critical care time) Labs Review Labs Reviewed  URINALYSIS, ROUTINE W REFLEX MICROSCOPIC - Abnormal; Notable for the following:    Leukocytes, UA SMALL (*)    All other components within normal limits  URINE MICROSCOPIC-ADD ON - Abnormal; Notable for the following:    Casts HYALINE CASTS (*)    All other components within normal limits  BASIC METABOLIC PANEL - Abnormal; Notable for the following:    Glucose, Bld 115 (*)    Creatinine, Ser 1.22 (*)    GFR calc non Af Amer 38 (*)    GFR calc Af Amer 44 (*)    All other components within normal limits  CBC WITH  DIFFERENTIAL   Imaging Review Dg Chest 2 View  10/03/2013   CLINICAL DATA:  Fall.  Right hip pain.  EXAM: CHEST  2 VIEW  COMPARISON:  DG CHEST 2 VIEW dated 01/26/2009; DG PELVIS 1-2 VIEWS dated 10/03/2013  FINDINGS: Midline trachea. Normal heart size, with atherosclerosis in the transverse aorta. No pleural effusion or pneumothorax. Chin overlies the apices, greater on the right. Clear lungs.  IMPRESSION: No acute cardiopulmonary disease.   Electronically Signed   By: Jeronimo GreavesKyle  Talbot M.D.   On: 10/03/2013 20:59   Dg Pelvis 1-2 Views  10/03/2013   CLINICAL DATA:  Pain post trauma  EXAM: PELVIS - 1-2 VIEW  COMPARISON:  February 19, 2005  FINDINGS: There is no well-defined fracture or dislocation. A sclerotic area at in the subcapital femoral region on the left appear stable compared to prior study and is of questionable etiology. There is moderate narrowing of both hip joints. Calcifications in the right pelvis probably represent uterine leiomyomas. There is no erosive change.  IMPRESSION: Moderate symmetric osteoarthritic change in both hip joints. Sclerotic area in the subcapital femoral neck region on the left appears stable compared to prior study. Its etiology is uncertain. The stability of this finding makes impacted fracture on the left in this region unlikely. No acute fracture or dislocation is seen on this study. Apparent calcified uterine leiomyomas present.   Electronically Signed   By: Bretta BangWilliam  Woodruff M.D.   On: 10/03/2013 21:03   Ct Head Wo Contrast  10/03/2013   CLINICAL DATA:  Fall.  Headache  EXAM: CT HEAD WITHOUT CONTRAST  CT CERVICAL SPINE WITHOUT CONTRAST  TECHNIQUE: Multidetector CT imaging of the head and cervical spine was performed following the standard protocol without intravenous contrast. Multiplanar CT image reconstructions of the cervical spine were also generated.  COMPARISON:  MRI 09/26/2010  FINDINGS: CT HEAD FINDINGS  Mild atrophy. Mild chronic microvascular ischemic changes in the  white matter. Negative for acute infarct. Negative for hemorrhage or mass. Negative for skull fracture.  CT CERVICAL SPINE FINDINGS  1 mm anterior slip C4-5. Multilevel disc and facet degeneration. There is spondylosis throughout the cervical spine, most severe at C5-6 and C6-7 causing foraminal encroachment bilaterally.  Negative for fracture.  There is a calcification in the right submandibular gland measuring 6 x 10 mm compatible with a large salivary stone. Multiple thyroid nodules bilaterally. Thyroid ultrasound could be performed for further evaluation.  IMPRESSION: No acute intracranial abnormality.  Cervical degenerative changes. Negative for  cervical spine fracture.  Large right submandibular gland salivary stone.   Electronically Signed   By: Marlan Palau M.D.   On: 10/03/2013 21:28   Ct Cervical Spine Wo Contrast  10/03/2013   CLINICAL DATA:  Fall.  Headache  EXAM: CT HEAD WITHOUT CONTRAST  CT CERVICAL SPINE WITHOUT CONTRAST  TECHNIQUE: Multidetector CT imaging of the head and cervical spine was performed following the standard protocol without intravenous contrast. Multiplanar CT image reconstructions of the cervical spine were also generated.  COMPARISON:  MRI 09/26/2010  FINDINGS: CT HEAD FINDINGS  Mild atrophy. Mild chronic microvascular ischemic changes in the white matter. Negative for acute infarct. Negative for hemorrhage or mass. Negative for skull fracture.  CT CERVICAL SPINE FINDINGS  1 mm anterior slip C4-5. Multilevel disc and facet degeneration. There is spondylosis throughout the cervical spine, most severe at C5-6 and C6-7 causing foraminal encroachment bilaterally.  Negative for fracture.  There is a calcification in the right submandibular gland measuring 6 x 10 mm compatible with a large salivary stone. Multiple thyroid nodules bilaterally. Thyroid ultrasound could be performed for further evaluation.  IMPRESSION: No acute intracranial abnormality.  Cervical degenerative changes.  Negative for cervical spine fracture.  Large right submandibular gland salivary stone.   Electronically Signed   By: Marlan Palau M.D.   On: 10/03/2013 21:28     EKG Interpretation None      MDM   Final diagnoses:  None   FABLE HUISMAN is a 80 y.o. female here with headache s/p fall. Will need to r/o subdural. Will get CT head/neck, xrays.   10:36 PM CT head/neck unremarkable. Xrays showed no fracture. Labs unremarkable. UA nl. I think she likely has mild concussion. Will d/c home on reglan, meclizine.    Richardean Canal, MD 10/03/13 2236

## 2013-10-03 NOTE — Discharge Instructions (Signed)
Continue taking tylenol, motrin for headache.   Take reglan for severe headache.   Take meclizine for dizziness.   Follow up with your doctor.   Return to ER if you have severe headache, vomiting, worse dizziness.

## 2014-07-22 ENCOUNTER — Emergency Department (HOSPITAL_COMMUNITY)
Admission: EM | Admit: 2014-07-22 | Discharge: 2014-07-22 | Disposition: A | Discharge status: Home / Self Care | Payer: Medicare HMO | Attending: Emergency Medicine | Admitting: Emergency Medicine

## 2014-07-22 ENCOUNTER — Encounter (HOSPITAL_COMMUNITY): Payer: Self-pay | Admitting: Emergency Medicine

## 2014-07-22 DIAGNOSIS — I10 Essential (primary) hypertension: Secondary | ICD-10-CM | POA: Diagnosis not present

## 2014-07-22 DIAGNOSIS — Z79899 Other long term (current) drug therapy: Secondary | ICD-10-CM | POA: Diagnosis not present

## 2014-07-22 DIAGNOSIS — N390 Urinary tract infection, site not specified: Secondary | ICD-10-CM

## 2014-07-22 DIAGNOSIS — R1030 Lower abdominal pain, unspecified: Secondary | ICD-10-CM | POA: Diagnosis present

## 2014-07-22 LAB — COMPREHENSIVE METABOLIC PANEL
ALBUMIN: 4.4 g/dL (ref 3.5–5.2)
ALK PHOS: 80 U/L (ref 39–117)
ALT: 17 U/L (ref 0–35)
ANION GAP: 5 (ref 5–15)
AST: 28 U/L (ref 0–37)
BILIRUBIN TOTAL: 1 mg/dL (ref 0.3–1.2)
BUN: 14 mg/dL (ref 6–23)
CALCIUM: 9.4 mg/dL (ref 8.4–10.5)
CO2: 28 mmol/L (ref 19–32)
CREATININE: 1.16 mg/dL — AB (ref 0.50–1.10)
Chloride: 100 mEq/L (ref 96–112)
GFR calc Af Amer: 46 mL/min — ABNORMAL LOW (ref >90)
GFR calc non Af Amer: 40 mL/min — ABNORMAL LOW (ref >90)
GLUCOSE: 134 mg/dL — AB (ref 70–99)
Potassium: 4.5 mmol/L (ref 3.5–5.1)
SODIUM: 133 mmol/L — AB (ref 135–145)
TOTAL PROTEIN: 7.7 g/dL (ref 6.0–8.3)

## 2014-07-22 LAB — URINALYSIS, ROUTINE W REFLEX MICROSCOPIC
BILIRUBIN URINE: NEGATIVE
GLUCOSE, UA: NEGATIVE mg/dL
Hgb urine dipstick: NEGATIVE
Ketones, ur: NEGATIVE mg/dL
Nitrite: NEGATIVE
PH: 7.5 (ref 5.0–8.0)
PROTEIN: NEGATIVE mg/dL
Specific Gravity, Urine: 1.009 (ref 1.005–1.030)
Urobilinogen, UA: 0.2 mg/dL (ref 0.0–1.0)

## 2014-07-22 LAB — CBC WITH DIFFERENTIAL/PLATELET
BASOS PCT: 0 % (ref 0–1)
Basophils Absolute: 0 10*3/uL (ref 0.0–0.1)
EOS PCT: 2 % (ref 0–5)
Eosinophils Absolute: 0.1 10*3/uL (ref 0.0–0.7)
HEMATOCRIT: 35.5 % — AB (ref 36.0–46.0)
Hemoglobin: 12.4 g/dL (ref 12.0–15.0)
LYMPHS PCT: 32 % (ref 12–46)
Lymphs Abs: 1.3 10*3/uL (ref 0.7–4.0)
MCH: 31.8 pg (ref 26.0–34.0)
MCHC: 34.9 g/dL (ref 30.0–36.0)
MCV: 91 fL (ref 78.0–100.0)
MONOS PCT: 8 % (ref 3–12)
Monocytes Absolute: 0.3 10*3/uL (ref 0.1–1.0)
Neutro Abs: 2.4 10*3/uL (ref 1.7–7.7)
Neutrophils Relative %: 58 % (ref 43–77)
Platelets: 142 10*3/uL — ABNORMAL LOW (ref 150–400)
RBC: 3.9 MIL/uL (ref 3.87–5.11)
RDW: 15 % (ref 11.5–15.5)
WBC: 4.1 10*3/uL (ref 4.0–10.5)

## 2014-07-22 LAB — URINE MICROSCOPIC-ADD ON

## 2014-07-22 LAB — I-STAT CG4 LACTIC ACID, ED: Lactic Acid, Venous: 1.19 mmol/L (ref 0.5–2.2)

## 2014-07-22 MED ORDER — CEPHALEXIN 500 MG PO CAPS: 500.0000 mg | ORAL_CAPSULE | Freq: Two times a day (BID) | ORAL | Status: DC

## 2014-07-22 MED ORDER — DEXTROSE 5 % IV SOLN
1.0000 g | Freq: Once | INTRAVENOUS | Status: AC
  Administered 2014-07-22: 1 g via INTRAVENOUS
  Filled 2014-07-22: qty 10

## 2014-07-22 MED ORDER — FENTANYL CITRATE 0.05 MG/ML IJ SOLN
25.0000 ug | Freq: Once | INTRAMUSCULAR | Status: DC
  Filled 2014-07-22: qty 2

## 2014-07-22 MED ORDER — ONDANSETRON 4 MG PO TBDP: 4.0000 mg | ORAL_TABLET | Freq: Three times a day (TID) | ORAL | Status: DC | PRN

## 2014-07-22 MED ORDER — PROBIOTIC PO CAPS: 1.0000 | ORAL_CAPSULE | Freq: Every day | ORAL | Status: DC

## 2014-07-22 MED ORDER — PHENAZOPYRIDINE HCL 200 MG PO TABS: 200.0000 mg | ORAL_TABLET | Freq: Three times a day (TID) | ORAL | Status: DC | PRN

## 2014-07-22 MED ORDER — ONDANSETRON HCL 4 MG/2ML IJ SOLN
4.0000 mg | Freq: Once | INTRAMUSCULAR | Status: AC
  Administered 2014-07-22: 4 mg via INTRAVENOUS
  Filled 2014-07-22: qty 2

## 2014-07-22 MED ORDER — SODIUM CHLORIDE 0.9 % IV SOLN
INTRAVENOUS | Status: DC
  Administered 2014-07-22: 10:00:00 via INTRAVENOUS

## 2014-07-22 NOTE — Discharge Instructions (Signed)

## 2014-07-22 NOTE — ED Provider Notes (Signed)
TIME SEEN: 7:45 AM  CHIEF COMPLAINT: Dysuria, abdominal pain, nausea  HPI: Patient is a 80 year old female with history of hypertension, cardiomyopathy who presents emergency department with complaints of nocturnal polyuria, dysuria, suprapubic abdominal pressure, nausea and decreased appetite for the past 2 days. Denies vomiting or diarrhea. No fevers or chills. No chest pain or shortness of breath. States she's had urinary tract infections in the past and this was similar.  ROS: See HPI Constitutional: no fever  Eyes: no drainage  ENT: no runny nose   Cardiovascular:  no chest pain  Resp: no SOB  GI: no vomiting GU: no dysuria Integumentary: no rash  Allergy: no hives  Musculoskeletal: no leg swelling  Neurological: no slurred speech ROS otherwise negative  PAST MEDICAL HISTORY/PAST SURGICAL HISTORY:  Past Medical History  Diagnosis Date  . Bladder disease   . Cardiomyopathy     MEDICATIONS:  Prior to Admission medications   Medication Sig Start Date End Date Taking? Authorizing Provider  amLODipine (NORVASC) 2.5 MG tablet Take 2.5 mg by mouth daily.    Historical Provider, MD  brimonidine (ALPHAGAN P) 0.1 % SOLN Place 1 drop into both eyes 2 (two) times daily.    Historical Provider, MD  dorzolamide-timolol (COSOPT) 22.3-6.8 MG/ML ophthalmic solution Place 1 drop into both eyes 2 (two) times daily.    Historical Provider, MD  furosemide (LASIX) 20 MG tablet Take 20 mg by mouth daily.    Historical Provider, MD  hydrocodone-acetaminophen (LORCET-HD) 5-500 MG per capsule Take 1 capsule by mouth See admin instructions. Takes 1 tablet in the morning  And 1 tablet at bedtime and may take 1 extra table during the day    Historical Provider, MD  LORazepam (ATIVAN) 1 MG tablet Take 1 mg by mouth at bedtime.    Historical Provider, MD  meclizine (ANTIVERT) 12.5 MG tablet Take 1 tablet (12.5 mg total) by mouth 3 (three) times daily as needed for dizziness. 10/03/13   Richardean Canalavid H Yao, MD   metoCLOPramide (REGLAN) 10 MG tablet Take 1 tablet (10 mg total) by mouth every 6 (six) hours as needed for nausea (nausea/headache). 10/03/13   Richardean Canalavid H Yao, MD    ALLERGIES:  No Known Allergies  SOCIAL HISTORY:  History  Substance Use Topics  . Smoking status: Never Smoker   . Smokeless tobacco: Not on file  . Alcohol Use: No    FAMILY HISTORY: History reviewed. No pertinent family history.  EXAM: BP 197/58 mmHg  Pulse 78  Temp(Src) 98.1 F (36.7 C) (Oral)  Resp 18  SpO2 99% CONSTITUTIONAL: Alert and oriented and responds appropriately to questions. Well-appearing; well-nourished, elderly, pleasant, nontoxic HEAD: Normocephalic EYES: Conjunctivae clear, PERRL ENT: normal nose; no rhinorrhea; slightly dry mucous membranes; pharynx without lesions noted NECK: Supple, no meningismus, no LAD  CARD: RRR; S1 and S2 appreciated; no murmurs, no clicks, no rubs, no gallops RESP: Normal chest excursion without splinting or tachypnea; breath sounds clear and equal bilaterally; no wheezes, no rhonchi, no rales,  ABD/GI: Normal bowel sounds; non-distended; soft, tenderness over the suprapubic region without guarding or rebound, no peritoneal signs BACK:  The back appears normal and is non-tender to palpation, there is no CVA tenderness EXT: Normal ROM in all joints; non-tender to palpation; no edema; normal capillary refill; no cyanosis    SKIN: Normal color for age and race; warm NEURO: Moves all extremities equally PSYCH: The patient's mood and manner are appropriate. Grooming and personal hygiene are appropriate.  MEDICAL DECISION MAKING: Patient  here with dysuria, polyuria, nausea, abdominal pain. Suspect UTI. She is hemodynamically stable, afebrile. We'll obtain labs, urinalysis, urine culture. We'll give gentle IV hydration, fentanyl and Zofran.   ED PROGRESS: Patient's labs are unremarkable. Creatinine is mildly elevated which is her baseline. Urine does show leukocytes and  bacteria. Culture pending. Lactate normal. She is still hemodynamically stable, well-appearing. She reports feeling much better. She has received Rocephin in the ED and has been able to tolerate by mouth. We'll discharge home with prescription for Keflex. Have advised her to increase her fluid intake and follow-up with her PCP. Discussed return precautions. Patient and daughter is at bedside verbalize understanding and are comfortable with plan.     Layla MawKristen N Cherye Gaertner, DO 07/22/14 1039

## 2014-07-22 NOTE — ED Notes (Signed)
Labs/med delay d/t difficult IV stick

## 2014-07-22 NOTE — ED Notes (Signed)
Pt states past two nights she has been unable to sleep s/t nocturnal polyuria, intermittent burning feeling in lower abdominal pain, c/o dry heaves and no appetite.. Pt denies emesis, diarrhea.

## 2014-07-22 NOTE — ED Notes (Signed)
Patient has urinal with him trying to urinate

## 2014-07-25 LAB — URINE CULTURE: Colony Count: 30000

## 2014-12-24 ENCOUNTER — Encounter (HOSPITAL_COMMUNITY): Payer: Self-pay | Admitting: Emergency Medicine

## 2014-12-24 ENCOUNTER — Emergency Department (HOSPITAL_COMMUNITY)
Admission: EM | Admit: 2014-12-24 | Discharge: 2014-12-24 | Disposition: A | Discharge status: Home / Self Care | Payer: Medicare PPO | Attending: Emergency Medicine | Admitting: Emergency Medicine

## 2014-12-24 DIAGNOSIS — N390 Urinary tract infection, site not specified: Secondary | ICD-10-CM | POA: Diagnosis not present

## 2014-12-24 DIAGNOSIS — Z8679 Personal history of other diseases of the circulatory system: Secondary | ICD-10-CM | POA: Insufficient documentation

## 2014-12-24 DIAGNOSIS — R5383 Other fatigue: Secondary | ICD-10-CM | POA: Diagnosis not present

## 2014-12-24 DIAGNOSIS — Z79899 Other long term (current) drug therapy: Secondary | ICD-10-CM | POA: Diagnosis not present

## 2014-12-24 DIAGNOSIS — Z8719 Personal history of other diseases of the digestive system: Secondary | ICD-10-CM | POA: Insufficient documentation

## 2014-12-24 DIAGNOSIS — R109 Unspecified abdominal pain: Secondary | ICD-10-CM | POA: Diagnosis present

## 2014-12-24 HISTORY — DX: Diverticulosis of intestine, part unspecified, without perforation or abscess without bleeding: K57.90

## 2014-12-24 HISTORY — DX: Gastro-esophageal reflux disease without esophagitis: K21.9

## 2014-12-24 LAB — COMPREHENSIVE METABOLIC PANEL
ALBUMIN: 3.9 g/dL (ref 3.5–5.0)
ALT: 18 U/L (ref 14–54)
AST: 29 U/L (ref 15–41)
Alkaline Phosphatase: 58 U/L (ref 38–126)
Anion gap: 8 (ref 5–15)
BILIRUBIN TOTAL: 0.4 mg/dL (ref 0.3–1.2)
BUN: 22 mg/dL — ABNORMAL HIGH (ref 6–20)
CHLORIDE: 109 mmol/L (ref 101–111)
CO2: 24 mmol/L (ref 22–32)
Calcium: 9.1 mg/dL (ref 8.9–10.3)
Creatinine, Ser: 1.12 mg/dL — ABNORMAL HIGH (ref 0.44–1.00)
GFR calc Af Amer: 48 mL/min — ABNORMAL LOW (ref >60)
GFR calc non Af Amer: 42 mL/min — ABNORMAL LOW (ref >60)
Glucose, Bld: 107 mg/dL — ABNORMAL HIGH (ref 65–99)
POTASSIUM: 4.4 mmol/L (ref 3.5–5.1)
Sodium: 141 mmol/L (ref 135–145)
Total Protein: 6.9 g/dL (ref 6.5–8.1)

## 2014-12-24 LAB — CBC WITH DIFFERENTIAL/PLATELET
BASOS PCT: 0 % (ref 0–1)
Basophils Absolute: 0 10*3/uL (ref 0.0–0.1)
EOS ABS: 0.1 10*3/uL (ref 0.0–0.7)
Eosinophils Relative: 2 % (ref 0–5)
HEMATOCRIT: 36.1 % (ref 36.0–46.0)
HEMOGLOBIN: 11.6 g/dL — AB (ref 12.0–15.0)
Lymphocytes Relative: 26 % (ref 12–46)
Lymphs Abs: 1.4 10*3/uL (ref 0.7–4.0)
MCH: 29.4 pg (ref 26.0–34.0)
MCHC: 32.1 g/dL (ref 30.0–36.0)
MCV: 91.6 fL (ref 78.0–100.0)
MONOS PCT: 7 % (ref 3–12)
Monocytes Absolute: 0.4 10*3/uL (ref 0.1–1.0)
NEUTROS PCT: 65 % (ref 43–77)
Neutro Abs: 3.5 10*3/uL (ref 1.7–7.7)
PLATELETS: 245 10*3/uL (ref 150–400)
RBC: 3.94 MIL/uL (ref 3.87–5.11)
RDW: 15.2 % (ref 11.5–15.5)
WBC: 5.4 10*3/uL (ref 4.0–10.5)

## 2014-12-24 LAB — LIPASE, BLOOD: Lipase: 51 U/L (ref 22–51)

## 2014-12-24 LAB — URINE MICROSCOPIC-ADD ON

## 2014-12-24 LAB — URINALYSIS, ROUTINE W REFLEX MICROSCOPIC
Bilirubin Urine: NEGATIVE
GLUCOSE, UA: NEGATIVE mg/dL
HGB URINE DIPSTICK: NEGATIVE
KETONES UR: NEGATIVE mg/dL
Nitrite: NEGATIVE
PH: 6 (ref 5.0–8.0)
PROTEIN: NEGATIVE mg/dL
Specific Gravity, Urine: 1.02 (ref 1.005–1.030)
UROBILINOGEN UA: 0.2 mg/dL (ref 0.0–1.0)

## 2014-12-24 MED ORDER — CEPHALEXIN 500 MG PO CAPS: 500.0000 mg | ORAL_CAPSULE | Freq: Two times a day (BID) | ORAL | Status: DC

## 2014-12-24 MED ORDER — HYDROCODONE-ACETAMINOPHEN 7.5-325 MG PO TABS: 1.0000 | ORAL_TABLET | Freq: Four times a day (QID) | ORAL | Status: DC | PRN

## 2014-12-24 MED ORDER — CEPHALEXIN 500 MG PO CAPS
500.0000 mg | ORAL_CAPSULE | Freq: Once | ORAL | Status: AC
  Administered 2014-12-24: 500 mg via ORAL
  Filled 2014-12-24: qty 1

## 2014-12-24 MED ORDER — HYDROCODONE-ACETAMINOPHEN 5-325 MG PO TABS
1.0000 | ORAL_TABLET | Freq: Once | ORAL | Status: AC
  Administered 2014-12-24: 1 via ORAL
  Filled 2014-12-24: qty 1

## 2014-12-24 NOTE — ED Notes (Signed)
Bed: WA10 Expected date:  Expected time:  Means of arrival:  Comments: tr2 

## 2014-12-24 NOTE — ED Provider Notes (Signed)
CSN: [id number]     Arrival date & time 12/24/14  1622 History   First MD Initiated Contact with Patient 12/24/14 1704     Chief Complaint  Patient presents with  . Abdominal Pain  . Diarrhea     (Consider location/radiation/quality/duration/timing/severity/associated sxs/prior Treatment) HPI Comments: Patient presents with abdominal burning. She has chronic bladder pain and takes hydrocodone on a regular basis. She takes hydrocodone 7.5 mg tablets daily. She is only supposed to take 2 tablets a day but she's been taking 3 tablets a day and ran out of her hydrocodone early. She's not do to get filled again until June 14. She ran out of it 3 days ago. She states since that time she's had some increase in her abdominal burning. She also has had some loose stools. She feels tired. She denies any chest pain or shortness of breath. She denies any cough or congestion. She denies any fevers. There is no nausea or vomiting. She's not been on antibiotics recently.  Patient is a 81 y.o. female presenting with abdominal pain and diarrhea.  Abdominal Pain Associated symptoms: diarrhea, dysuria and fatigue   Associated symptoms: no chest pain, no chills, no cough, no fever, no hematuria, no nausea, no shortness of breath and no vomiting   Diarrhea Associated symptoms: abdominal pain   Associated symptoms: no arthralgias, no chills, no diaphoresis, no fever, no headaches and no vomiting     Past Medical History  Diagnosis Date  . Bladder disease   . Cardiomyopathy   . Diverticulosis   . Acid reflux    History reviewed. No pertinent past surgical history. No family history on file. History  Substance Use Topics  . Smoking status: Never Smoker   . Smokeless tobacco: Not on file  . Alcohol Use: No   OB History    No data available     Review of Systems  Constitutional: Positive for fatigue. Negative for fever, chills and diaphoresis.  HENT: Negative for congestion, rhinorrhea and sneezing.    Eyes: Negative.   Respiratory: Negative for cough, chest tightness and shortness of breath.   Cardiovascular: Negative for chest pain and leg swelling.  Gastrointestinal: Positive for abdominal pain and diarrhea. Negative for nausea, vomiting and blood in stool.  Genitourinary: Positive for dysuria. Negative for frequency, hematuria, flank pain and difficulty urinating.  Musculoskeletal: Negative for back pain and arthralgias.  Skin: Negative for rash.  Neurological: Negative for dizziness, speech difficulty, weakness, numbness and headaches.      Allergies  Review of patient's allergies indicates no known allergies.  Home Medications   Prior to Admission medications   Medication Sig Start Date End Date Taking? Authorizing Provider  amLODipine (NORVASC) 5 MG tablet Take 5 mg by mouth daily.   Yes Historical Provider, MD  dorzolamide-timolol (COSOPT) 22.3-6.8 MG/ML ophthalmic solution Place 1 drop into both eyes 2 (two) times daily.   Yes Historical Provider, MD  furosemide (LASIX) 20 MG tablet Take 20 mg by mouth daily.   Yes Historical Provider, MD  LORazepam (ATIVAN) 1 MG tablet Take 1 mg by mouth at bedtime.   Yes Historical Provider, MD  lubiprostone (AMITIZA) 24 MCG capsule Take 24 mcg by mouth at bedtime.   Yes Historical Provider, MD  cephALEXin (KEFLEX) 500 MG capsule Take 1 capsule (500 mg total) by mouth 2 (two) times daily. 12/24/14   Rolan Bucco, MD  HYDROcodone-acetaminophen (NORCO) 7.5-325 MG per tablet Take 1 tablet by mouth every 6 (six) hours as needed for  moderate pain. 12/24/14   Rolan BuccoMelanie Teckla Christiansen, MD  meclizine (ANTIVERT) 12.5 MG tablet Take 1 tablet (12.5 mg total) by mouth 3 (three) times daily as needed for dizziness. Patient not taking: Reported on 07/22/2014 10/03/13   Richardean Canalavid H Yao, MD  metoCLOPramide (REGLAN) 10 MG tablet Take 1 tablet (10 mg total) by mouth every 6 (six) hours as needed for nausea (nausea/headache). Patient not taking: Reported on 07/22/2014 10/03/13    Richardean Canalavid H Yao, MD  ondansetron (ZOFRAN ODT) 4 MG disintegrating tablet Take 1 tablet (4 mg total) by mouth every 8 (eight) hours as needed for nausea or vomiting. Patient not taking: Reported on 12/24/2014 07/22/14   Kristen N Ward, DO  phenazopyridine (PYRIDIUM) 200 MG tablet Take 1 tablet (200 mg total) by mouth 3 (three) times daily as needed for pain. Patient not taking: Reported on 12/24/2014 07/22/14   Kristen N Ward, DO  Probiotic CAPS Take 1 capsule by mouth daily. Patient not taking: Reported on 12/24/2014 07/22/14   Kristen N Ward, DO   BP 154/61 mmHg  Pulse 84  Temp(Src) 98.5 F (36.9 C) (Oral)  Resp 18  SpO2 100% Physical Exam  Constitutional: She is oriented to person, place, and time. She appears well-developed and well-nourished.  HENT:  Head: Normocephalic and atraumatic.  Eyes: Pupils are equal, round, and reactive to light.  Neck: Normal range of motion. Neck supple.  Cardiovascular: Normal rate, regular rhythm and normal heart sounds.   Pulmonary/Chest: Effort normal and breath sounds normal. No respiratory distress. She has no wheezes. She has no rales. She exhibits no tenderness.  Abdominal: Soft. Bowel sounds are normal. There is no tenderness. There is no rebound and no guarding.  Musculoskeletal: Normal range of motion. She exhibits no edema.  Lymphadenopathy:    She has no cervical adenopathy.  Neurological: She is alert and oriented to person, place, and time.  Skin: Skin is warm and dry. No rash noted.  Psychiatric: She has a normal mood and affect.    ED Course  Procedures (including critical care time) Labs Review Labs Reviewed  CBC WITH DIFFERENTIAL/PLATELET - Abnormal; Notable for the following:    Hemoglobin 11.6 (*)    All other components within normal limits  COMPREHENSIVE METABOLIC PANEL - Abnormal; Notable for the following:    Glucose, Bld 107 (*)    BUN 22 (*)    Creatinine, Ser 1.12 (*)    GFR calc non Af Amer 42 (*)    GFR calc Af Amer 48  (*)    All other components within normal limits  URINALYSIS, ROUTINE W REFLEX MICROSCOPIC (NOT AT [facility]) - Abnormal; Notable for the following:    APPearance CLOUDY (*)    Leukocytes, UA LARGE (*)    All other components within normal limits  URINE MICROSCOPIC-ADD ON - Abnormal; Notable for the following:    Squamous Epithelial / LPF FEW (*)    Bacteria, UA FEW (*)    All other components within normal limits  URINE CULTURE  LIPASE, BLOOD    Imaging Review No results found.   EKG Interpretation None      MDM   Final diagnoses:  UTI (lower urinary tract infection)    Patient presents with fatigue and burning on urination. She does appear to have a UTI. Her urine was sent for culture. I will start her on Keflex. She also been out of her hydrocodone for 3 days. She was given a dose of hydrocodone in the ED and is  feeling much better. She's able to ambulate without difficulty. She's had no vomiting fevers or signs of systemic illness. She was encouraged to have close follow-up with her primary care physician. She was in a prescription for a short course of her hydrocodone and advised that she'll need to follow-up with her primary care physician for ongoing pain management.    Rolan Bucco, MD 12/24/14 2116

## 2014-12-24 NOTE — ED Notes (Signed)
Bed: WA10 Expected date:  Expected time:  Means of arrival:  Comments: 

## 2014-12-24 NOTE — Discharge Instructions (Signed)

## 2014-12-24 NOTE — ED Notes (Signed)
Pt c/o generalized abdominal "burning" x 3 days. Pt sts she was recently treated for a bladder infection and is out of hydrocodone that she had been taking for the pain. Pt denies N/V but c/o diarrhea x three days as well. Pt denies Chest pain and SOB. Pt sts that the pain from her bladder infection had subsided but she still had burning after urination. A&Ox4 and ambulatory with cane.

## 2014-12-24 NOTE — ED Notes (Signed)
MD at bedside. 

## 2014-12-24 NOTE — ED Notes (Signed)
Pt ambulated from room to end of hallway with no distress.  Pt denies any nausea, or dizziness.  Pt sts she has used a cane for the past 2-3 years.  Pt used this cane while ambulating.

## 2014-12-24 NOTE — Progress Notes (Signed)
EDCM spoke to patient and her daughter at bedside.  Patient[place] in [place].  Patient lives at home alone.  Patient reports she is able to perform her own ADL's without difficulty.  Patient reports, "A lady comes to her home and brings her food."  Patient has a cane at home, no further dme.  Patient does not have home health services at this time.  EDCM provided patient's daughter with a list of [place] health agencies [place], explained services.  Patient reports she does not need home health services at this time and does not require further dme.  Patient reports her pcp is Dr. Corine ShelterGeorge Kilpatrick.  System updated.  EDCM informed patient and her daughter if patient needs home health services in the future she may obtain home health services through her pcp.  Patient verbalized understanding.  No further EDCM needs at this time.

## 2014-12-24 NOTE — ED Notes (Signed)
MD at bedside. Dr. Belfi at bedside.  

## 2014-12-26 LAB — URINE CULTURE

## 2015-02-28 ENCOUNTER — Emergency Department (HOSPITAL_COMMUNITY)
Admission: EM | Admit: 2015-02-28 | Discharge: 2015-02-28 | Disposition: A | Discharge status: Home / Self Care | Payer: Medicare PPO | Attending: Emergency Medicine | Admitting: Emergency Medicine

## 2015-02-28 ENCOUNTER — Encounter (HOSPITAL_COMMUNITY): Payer: Self-pay

## 2015-02-28 DIAGNOSIS — R350 Frequency of micturition: Secondary | ICD-10-CM | POA: Insufficient documentation

## 2015-02-28 DIAGNOSIS — Z87448 Personal history of other diseases of urinary system: Secondary | ICD-10-CM | POA: Diagnosis not present

## 2015-02-28 DIAGNOSIS — I10 Essential (primary) hypertension: Secondary | ICD-10-CM | POA: Insufficient documentation

## 2015-02-28 DIAGNOSIS — Z8719 Personal history of other diseases of the digestive system: Secondary | ICD-10-CM | POA: Insufficient documentation

## 2015-02-28 DIAGNOSIS — R531 Weakness: Secondary | ICD-10-CM | POA: Diagnosis not present

## 2015-02-28 DIAGNOSIS — R197 Diarrhea, unspecified: Secondary | ICD-10-CM | POA: Insufficient documentation

## 2015-02-28 DIAGNOSIS — Z79899 Other long term (current) drug therapy: Secondary | ICD-10-CM | POA: Diagnosis not present

## 2015-02-28 DIAGNOSIS — Z7901 Long term (current) use of anticoagulants: Secondary | ICD-10-CM | POA: Diagnosis not present

## 2015-02-28 DIAGNOSIS — R300 Dysuria: Secondary | ICD-10-CM | POA: Insufficient documentation

## 2015-02-28 DIAGNOSIS — Z87440 Personal history of urinary (tract) infections: Secondary | ICD-10-CM | POA: Insufficient documentation

## 2015-02-28 LAB — COMPREHENSIVE METABOLIC PANEL
ALT: 29 U/L (ref 14–54)
ANION GAP: 8 (ref 5–15)
AST: 33 U/L (ref 15–41)
Albumin: 4.4 g/dL (ref 3.5–5.0)
Alkaline Phosphatase: 69 U/L (ref 38–126)
BUN: 20 mg/dL (ref 6–20)
CALCIUM: 9.6 mg/dL (ref 8.9–10.3)
CO2: 26 mmol/L (ref 22–32)
CREATININE: 1.29 mg/dL — AB (ref 0.44–1.00)
Chloride: 102 mmol/L (ref 101–111)
GFR calc Af Amer: 41 mL/min — ABNORMAL LOW (ref >60)
GFR calc non Af Amer: 35 mL/min — ABNORMAL LOW (ref >60)
GLUCOSE: 112 mg/dL — AB (ref 65–99)
POTASSIUM: 4.2 mmol/L (ref 3.5–5.1)
SODIUM: 136 mmol/L (ref 135–145)
Total Bilirubin: 0.5 mg/dL (ref 0.3–1.2)
Total Protein: 8.2 g/dL — ABNORMAL HIGH (ref 6.5–8.1)

## 2015-02-28 LAB — CBC WITH DIFFERENTIAL/PLATELET
BASOS PCT: 0 % (ref 0–1)
Basophils Absolute: 0 10*3/uL (ref 0.0–0.1)
EOS ABS: 0.1 10*3/uL (ref 0.0–0.7)
Eosinophils Relative: 1 % (ref 0–5)
HEMATOCRIT: 38.8 % (ref 36.0–46.0)
Hemoglobin: 12.8 g/dL (ref 12.0–15.0)
LYMPHS ABS: 1.3 10*3/uL (ref 0.7–4.0)
LYMPHS PCT: 25 % (ref 12–46)
MCH: 29.8 pg (ref 26.0–34.0)
MCHC: 33 g/dL (ref 30.0–36.0)
MCV: 90.2 fL (ref 78.0–100.0)
MONOS PCT: 7 % (ref 3–12)
Monocytes Absolute: 0.4 10*3/uL (ref 0.1–1.0)
NEUTROS ABS: 3.5 10*3/uL (ref 1.7–7.7)
Neutrophils Relative %: 67 % (ref 43–77)
Platelets: 271 10*3/uL (ref 150–400)
RBC: 4.3 MIL/uL (ref 3.87–5.11)
RDW: 14.8 % (ref 11.5–15.5)
WBC: 5.2 10*3/uL (ref 4.0–10.5)

## 2015-02-28 LAB — URINALYSIS, ROUTINE W REFLEX MICROSCOPIC
BILIRUBIN URINE: NEGATIVE
GLUCOSE, UA: NEGATIVE mg/dL
Hgb urine dipstick: NEGATIVE
KETONES UR: NEGATIVE mg/dL
Leukocytes, UA: NEGATIVE
Nitrite: NEGATIVE
PH: 5.5 (ref 5.0–8.0)
Protein, ur: 30 mg/dL — AB
Specific Gravity, Urine: 1.023 (ref 1.005–1.030)
Urobilinogen, UA: 0.2 mg/dL (ref 0.0–1.0)

## 2015-02-28 LAB — URINE MICROSCOPIC-ADD ON

## 2015-02-28 MED ORDER — PHENAZOPYRIDINE HCL 100 MG PO TABS: 100.0000 mg | ORAL_TABLET | Freq: Three times a day (TID) | ORAL | Status: DC

## 2015-02-28 MED ORDER — LOPERAMIDE HCL 2 MG PO CAPS: 2.0000 mg | ORAL_CAPSULE | Freq: Four times a day (QID) | ORAL | Status: DC | PRN

## 2015-02-28 MED ORDER — SODIUM CHLORIDE 0.9 % IV BOLUS (SEPSIS)
500.0000 mL | Freq: Once | INTRAVENOUS | Status: AC
  Administered 2015-02-28: 500 mL via INTRAVENOUS

## 2015-02-28 MED ORDER — DIPHENOXYLATE-ATROPINE 2.5-0.025 MG PO TABS
1.0000 | ORAL_TABLET | Freq: Once | ORAL | Status: AC
  Administered 2015-02-28: 1 via ORAL
  Filled 2015-02-28: qty 1

## 2015-02-28 NOTE — Discharge Instructions (Signed)
Food Choices to Help Relieve Diarrhea °When you have diarrhea, the foods you eat and your eating habits are very important. Choosing the right foods and drinks can help relieve diarrhea. Also, because diarrhea can last up to 7 days, you need to replace lost fluids and electrolytes (such as sodium, potassium, and chloride) in order to help prevent dehydration.  °WHAT GENERAL GUIDELINES DO I NEED TO FOLLOW? °· Slowly drink 1 cup (8 oz) of fluid for each episode of diarrhea. If you are getting enough fluid, your urine will be clear or pale yellow. °· Eat starchy foods. Some good choices include white rice, white toast, pasta, low-fiber cereal, baked potatoes (without the skin), saltine crackers, and bagels. °· Avoid large servings of any cooked vegetables. °· Limit fruit to two servings per day. A serving is ½ cup or 1 small piece. °· Choose foods with less than 2 g of fiber per serving. °· Limit fats to less than 8 tsp (38 g) per day. °· Avoid fried foods. °· Eat foods that have probiotics in them. Probiotics can be found in certain dairy products. °· Avoid foods and beverages that may increase the speed at which food moves through the stomach and intestines (gastrointestinal tract). Things to avoid include: °¨ High-fiber foods, such as dried fruit, raw fruits and vegetables, nuts, seeds, and whole grain foods. °¨ Spicy foods and high-fat foods. °¨ Foods and beverages sweetened with high-fructose corn syrup, honey, or sugar alcohols such as xylitol, sorbitol, and mannitol. °WHAT FOODS ARE RECOMMENDED? °Grains °White rice. White, French, or pita breads (fresh or toasted), including plain rolls, buns, or bagels. White pasta. Saltine, soda, or graham crackers. Pretzels. Low-fiber cereal. Cooked cereals made with water (such as cornmeal, farina, or cream cereals). Plain muffins. Matzo. Melba toast. Zwieback.  °Vegetables °Potatoes (without the skin). Strained tomato and vegetable juices. Most well-cooked and canned  vegetables without seeds. Tender lettuce. °Fruits °Cooked or canned applesauce, apricots, cherries, fruit cocktail, grapefruit, peaches, pears, or plums. Fresh bananas, apples without skin, cherries, grapes, cantaloupe, grapefruit, peaches, oranges, or plums.  °Meat and Other Protein Products °Baked or boiled chicken. Eggs. Tofu. Fish. Seafood. Smooth peanut butter. Ground or well-cooked tender beef, ham, veal, lamb, pork, or poultry.  °Dairy °Plain yogurt, kefir, and unsweetened liquid yogurt. Lactose-free milk, buttermilk, or soy milk. Plain hard cheese. °Beverages °Sport drinks. Clear broths. Diluted fruit juices (except prune). Regular, caffeine-free sodas such as ginger ale. Water. Decaffeinated teas. Oral rehydration solutions. Sugar-free beverages not sweetened with sugar alcohols. °Other °Bouillon, broth, or soups made from recommended foods.  °The items listed above may not be a complete list of recommended foods or beverages. Contact your dietitian for more options. °WHAT FOODS ARE NOT RECOMMENDED? °Grains °Whole grain, whole wheat, bran, or rye breads, rolls, pastas, crackers, and cereals. Wild or brown rice. Cereals that contain more than 2 g of fiber per serving. Corn tortillas or taco shells. Cooked or dry oatmeal. Granola. Popcorn. °Vegetables °Raw vegetables. Cabbage, broccoli, Brussels sprouts, artichokes, baked beans, beet greens, corn, kale, legumes, peas, sweet potatoes, and yams. Potato skins. Cooked spinach and cabbage. °Fruits °Dried fruit, including raisins and dates. Raw fruits. Stewed or dried prunes. Fresh apples with skin, apricots, mangoes, pears, raspberries, and strawberries.  °Meat and Other Protein Products °Chunky peanut butter. Nuts and seeds. Beans and lentils. Bacon.  °Dairy °High-fat cheeses. Milk, chocolate milk, and beverages made with milk, such as milk shakes. Cream. Ice cream. °Sweets and Desserts °Sweet rolls, doughnuts, and sweet breads. Pancakes   and waffles. °Fats and  Oils °Butter. Cream sauces. Margarine. Salad oils. Plain salad dressings. Olives. Avocados.  °Beverages °Caffeinated beverages (such as coffee, tea, soda, or energy drinks). Alcoholic beverages. Fruit juices with pulp. Prune juice. Soft drinks sweetened with high-fructose corn syrup or sugar alcohols. °Other °Coconut. Hot sauce. Chili powder. Mayonnaise. Gravy. Cream-based or milk-based soups.  °The items listed above may not be a complete list of foods and beverages to avoid. Contact your dietitian for more information. °WHAT SHOULD I DO IF I BECOME DEHYDRATED? °Diarrhea can sometimes lead to dehydration. Signs of dehydration include dark urine and dry mouth and skin. If you think you are dehydrated, you should rehydrate with an oral rehydration solution. These solutions can be purchased at pharmacies, retail stores, or online.  °Drink ½-1 cup (120-240 mL) of oral rehydration solution each time you have an episode of diarrhea. If drinking this amount makes your diarrhea worse, try drinking smaller amounts more often. For example, drink 1-3 tsp (5-15 mL) every 5-10 minutes.  °A general rule for staying hydrated is to drink 1½-2 L of fluid per day. Talk to your health care provider about the specific amount you should be drinking each day. Drink enough fluids to keep your urine clear or pale yellow. °Document Released: 09/29/2003 Document Revised: 07/14/2013 Document Reviewed: 06/01/2013 °[employer]® Patient Information ©2015 [employer]. This information is not intended to replace advice given to you by your health care provider. Make sure you discuss any questions you have with your health care provider. °Diarrhea °Diarrhea is frequent loose and watery bowel movements. It can cause you to feel weak and dehydrated. Dehydration can cause you to become tired and thirsty, have a dry mouth, and have decreased urination that often is dark yellow. Diarrhea is a sign of another problem, most often an infection that will  not last long. In most cases, diarrhea typically lasts 2-3 days. However, it can last longer if it is a sign of something more serious. It is important to treat your diarrhea as directed by your caregiver to lessen or prevent future episodes of diarrhea. °CAUSES  °Some common causes include: °· Gastrointestinal infections caused by viruses, bacteria, or parasites. °· Food poisoning or food allergies. °· Certain medicines, such as antibiotics, chemotherapy, and laxatives. °· Artificial sweeteners and fructose. °· Digestive disorders. °HOME CARE INSTRUCTIONS °· Ensure adequate fluid intake (hydration): Have 1 cup (8 oz) of fluid for each diarrhea episode. Avoid fluids that contain simple sugars or sports drinks, fruit juices, whole milk products, and sodas. Your urine should be clear or pale yellow if you are drinking enough fluids. Hydrate with an oral rehydration solution that you can purchase at pharmacies, retail stores, and online. You can prepare an oral rehydration solution at home by mixing the following ingredients together: °¨  - tsp table salt. °¨ ¾ tsp baking soda. °¨  tsp salt substitute containing potassium chloride. °¨ 1  tablespoons sugar. °¨ 1 L (34 oz) of water. °· Certain foods and beverages may increase the speed at which food moves through the gastrointestinal (GI) tract. These foods and beverages should be avoided and include: °¨ Caffeinated and alcoholic beverages. °¨ High-fiber foods, such as raw fruits and vegetables, nuts, seeds, and whole grain breads and cereals. °¨ Foods and beverages sweetened with sugar alcohols, such as xylitol, sorbitol, and mannitol. °· Some foods may be well tolerated and may help thicken stool including: °¨ Starchy foods, such as rice, toast, pasta, low-sugar cereal, oatmeal, grits, baked potatoes,   crackers, and bagels.  Bananas.  Applesauce.  Add probiotic-rich foods to help increase healthy bacteria in the GI tract, such as yogurt and fermented milk  products.  Wash your hands well after each diarrhea episode.  Only take over-the-counter or prescription medicines as directed by your caregiver.  Take a warm bath to relieve any burning or pain from frequent diarrhea episodes. SEEK IMMEDIATE MEDICAL CARE IF:   You are unable to keep fluids down.  You have persistent vomiting.  You have blood in your stool, or your stools are black and tarry.  You do not urinate in 6-8 hours, or there is only a small amount of very dark urine.  You have abdominal pain that increases or localizes.  You have weakness, dizziness, confusion, or light-headedness.  You have a severe headache.  Your diarrhea gets worse or does not get better.  You have a fever or persistent symptoms for more than 2-3 days.  You have a fever and your symptoms suddenly get worse. MAKE SURE YOU:   Understand these instructions.  Will watch your condition.  Will get help right away if you are not doing well or get worse. Document Released: 06/29/2002 Document Revised: 11/23/2013 Document Reviewed: 03/16/2012 [facility] Patient Information 2015 [place], [place]. This information is not intended to replace advice given to you by your health care provider. Make sure you discuss any questions you have with your health care provider. Dysuria Dysuria is the medical term for pain with urination. There are many causes for dysuria, but urinary tract infection is the most common. If a urinalysis was performed it can show that there is a urinary tract infection. A urine culture confirms that you or your child is sick. You will need to follow up with a healthcare provider because:  If a urine culture was done you will need to know the culture results and treatment recommendations.  If the urine culture was positive, you or your child will need to be put on antibiotics or know if the antibiotics prescribed are the right antibiotics for your urinary tract infection.  If the urine  culture is negative (no urinary tract infection), then other causes may need to be explored or antibiotics need to be stopped. Today laboratory work may have been done and there does not seem to be an infection. If cultures were done they will take at least 24 to 48 hours to be completed. Today x-rays may have been taken and they read as normal. No cause can be found for the problems. The x-rays may be re-read by a radiologist and you will be contacted if additional findings are made. You or your child may have been put on medications to help with this problem until you can see your primary caregiver. If the problems get better, see your primary caregiver if the problems return. If you were given antibiotics (medications which kill germs), take all of the mediations as directed for the full course of treatment.  If laboratory work was done, you need to find the results. Leave a telephone number where you can be reached. If this is not possible, make sure you find out how you are to get test results. HOME CARE INSTRUCTIONS   Drink lots of fluids. For adults, drink eight, 8 ounce glasses of clear juice or water a day. For children, replace fluids as suggested by your caregiver.  Empty the bladder often. Avoid holding urine for long periods of time.  After a bowel movement, women should cleanse  front to back, using each tissue only once.  Empty your bladder before and after sexual intercourse.  Take all the medicine given to you until it is gone. You may feel better in a few days, but TAKE ALL MEDICINE.  Avoid caffeine, tea, alcohol and carbonated beverages, because they tend to irritate the bladder.  In men, alcohol may irritate the prostate.  Only take over-the-counter or prescription medicines for pain, discomfort, or fever as directed by your caregiver.  If your caregiver has given you a follow-up appointment, it is very important to keep that appointment. Not keeping the appointment could  result in a chronic or permanent injury, pain, and disability. If there is any problem keeping the appointment, you must call back to this facility for assistance. SEEK IMMEDIATE MEDICAL CARE IF:   Back pain develops.  A fever develops.  There is nausea (feeling sick to your stomach) or vomiting (throwing up).  Problems are no better with medications or are getting worse. MAKE SURE YOU:   Understand these instructions.  Will watch your condition.  Will get help right away if you are not doing well or get worse. Document Released: 04/06/2004 Document Revised: 10/01/2011 Document Reviewed: 02/12/2008 [facility] Patient Information 2015 [place], [place]. This information is not intended to replace advice given to you by your health care provider. Make sure you discuss any questions you have with your health care provider.

## 2015-02-28 NOTE — ED Provider Notes (Signed)
CSN: [id number]     Arrival date & time 02/28/15  1147 History   First MD Initiated Contact with Patient 02/28/15 1604     Chief Complaint  Patient presents with  . Diarrhea  . Urinary Frequency     (Consider location/radiation/quality/duration/timing/severity/associated sxs/prior Treatment) HPI Comments: Patient with a history of HTN, previous recurrent UTI presents with 4 days of pain following urination and diarrhea. She denies abdominal pain, vomiting or known fever. She has had some weakness but denies fall since becoming symptomatic. She denies nausea but reports she has lost her appetite and, though she has been drinking plenty of water, has not been eating. No cough, SOB, chest pain.  The history is provided by the patient and a relative. No language interpreter was used.    Past Medical History  Diagnosis Date  . Bladder disease   . Cardiomyopathy   . Diverticulosis   . Acid reflux    History reviewed. No pertinent past surgical history. History reviewed. No pertinent family history. History  Substance Use Topics  . Smoking status: Never Smoker   . Smokeless tobacco: Never Used  . Alcohol Use: No   OB History    No data available     Review of Systems  Constitutional: Negative for fever and chills.  HENT: Negative.   Respiratory: Negative.   Cardiovascular: Negative.   Gastrointestinal: Positive for diarrhea. Negative for nausea, vomiting and abdominal pain.  Genitourinary: Positive for dysuria. Negative for pelvic pain.       See HPI.  Musculoskeletal: Negative.   Skin: Negative.   Neurological: Positive for weakness. Negative for syncope and light-headedness.      Allergies  Review of patient's allergies indicates no known allergies.  Home Medications   Prior to Admission medications   Medication Sig Start Date End Date Taking? Authorizing Provider  amLODipine (NORVASC) 5 MG tablet Take 5 mg by mouth daily.   Yes Historical Provider, MD   dorzolamide-timolol (COSOPT) 22.3-6.8 MG/ML ophthalmic solution Place 1 drop into both eyes 2 (two) times daily.   Yes Historical Provider, MD  ELMIRON 100 MG capsule Take 1 capsule by mouth 2 (two) times daily. 02/25/15  Yes Historical Provider, MD  furosemide (LASIX) 20 MG tablet Take 20 mg by mouth daily.   Yes Historical Provider, MD  HYDROcodone-acetaminophen (NORCO) 7.5-325 MG per tablet Take 1 tablet by mouth every 6 (six) hours as needed for moderate pain. 12/24/14  Yes Rolan Bucco, MD  LORazepam (ATIVAN) 1 MG tablet Take 1 mg by mouth at bedtime.   Yes Historical Provider, MD  lubiprostone (AMITIZA) 24 MCG capsule Take 24 mcg by mouth daily as needed for constipation (IBS).    Yes Historical Provider, MD  meclizine (ANTIVERT) 12.5 MG tablet Take 1 tablet (12.5 mg total) by mouth 3 (three) times daily as needed for dizziness. 10/03/13  Yes Richardean Canal, MD  cephALEXin (KEFLEX) 500 MG capsule Take 1 capsule (500 mg total) by mouth 2 (two) times daily. Patient not taking: Reported on 02/28/2015 12/24/14   Rolan Bucco, MD   BP 147/65 mmHg  Pulse 76  Temp(Src) 97.8 F (36.6 C) (Oral)  Resp 18  Ht 5\' 3"  (1.6 m)  Wt 140 lb (63.504 kg)  BMI 24.81 kg/m2  SpO2 96% Physical Exam  Constitutional: She is oriented to person, place, and time. She appears well-developed and well-nourished.  HENT:  Head: Normocephalic.  Mouth/Throat: Mucous membranes are dry.  Neck: Normal range of motion. Neck supple.  Cardiovascular:  Normal rate and regular rhythm.   Pulmonary/Chest: Effort normal and breath sounds normal.  Abdominal: Soft. Bowel sounds are normal. There is no tenderness. There is no rebound and no guarding.  Musculoskeletal: Normal range of motion.  Neurological: She is alert and oriented to person, place, and time.  Skin: Skin is warm and dry. No rash noted.  Psychiatric: She has a normal mood and affect.    ED Course  Procedures (including critical care time) Labs Review Labs Reviewed   URINE CULTURE  URINALYSIS, ROUTINE W REFLEX MICROSCOPIC (NOT AT [facility])  CBC WITH DIFFERENTIAL/PLATELET  COMPREHENSIVE METABOLIC PANEL   Results for orders placed or performed during the hospital encounter of 02/28/15  Urinalysis, Routine w reflex microscopic-may I&O cath if menses (not at [facility])  Result Value Ref Range   Color, Urine AMBER (A) YELLOW   APPearance CLOUDY (A) CLEAR   Specific Gravity, Urine 1.023 1.005 - 1.030   pH 5.5 5.0 - 8.0   Glucose, UA NEGATIVE NEGATIVE mg/dL   Hgb urine dipstick NEGATIVE NEGATIVE   Bilirubin Urine NEGATIVE NEGATIVE   Ketones, ur NEGATIVE NEGATIVE mg/dL   Protein, ur 30 (A) NEGATIVE mg/dL   Urobilinogen, UA 0.2 0.0 - 1.0 mg/dL   Nitrite NEGATIVE NEGATIVE   Leukocytes, UA NEGATIVE NEGATIVE  CBC with Differential  Result Value Ref Range   WBC 5.2 4.0 - 10.5 K/uL   RBC 4.30 3.87 - 5.11 MIL/uL   Hemoglobin 12.8 12.0 - 15.0 g/dL   HCT 16.1 09.6 - 04.5 %   MCV 90.2 78.0 - 100.0 fL   MCH 29.8 26.0 - 34.0 pg   MCHC 33.0 30.0 - 36.0 g/dL   RDW 40.9 81.1 - 91.4 %   Platelets 271 150 - 400 K/uL   Neutrophils Relative % 67 43 - 77 %   Neutro Abs 3.5 1.7 - 7.7 K/uL   Lymphocytes Relative 25 12 - 46 %   Lymphs Abs 1.3 0.7 - 4.0 K/uL   Monocytes Relative 7 3 - 12 %   Monocytes Absolute 0.4 0.1 - 1.0 K/uL   Eosinophils Relative 1 0 - 5 %   Eosinophils Absolute 0.1 0.0 - 0.7 K/uL   Basophils Relative 0 0 - 1 %   Basophils Absolute 0.0 0.0 - 0.1 K/uL  Comprehensive metabolic panel  Result Value Ref Range   Sodium 136 135 - 145 mmol/L   Potassium 4.2 3.5 - 5.1 mmol/L   Chloride 102 101 - 111 mmol/L   CO2 26 22 - 32 mmol/L   Glucose, Bld 112 (H) 65 - 99 mg/dL   BUN 20 6 - 20 mg/dL   Creatinine, Ser 7.82 (H) 0.44 - 1.00 mg/dL   Calcium 9.6 8.9 - 95.6 mg/dL   Total Protein 8.2 (H) 6.5 - 8.1 g/dL   Albumin 4.4 3.5 - 5.0 g/dL   AST 33 15 - 41 U/L   ALT 29 14 - 54 U/L   Alkaline Phosphatase 69 38 - 126 U/L   Total Bilirubin 0.5 0.3 - 1.2 mg/dL    GFR calc non Af Amer 35 (L) >60 mL/min   GFR calc Af Amer 41 (L) >60 mL/min   Anion gap 8 5 - 15  Urine microscopic-add on  Result Value Ref Range   Bacteria, UA FEW (A) RARE   Casts HYALINE CASTS (A) NEGATIVE   Urine-Other MUCOUS PRESENT     Imaging Review No results found.   EKG Interpretation None      MDM  Final diagnoses:  None    1. Dysuria 2. Diarrhea  The patient has had one bowel movement in the ED (over 7 hours).  Labs reassuring, no infection suspected. She reports having painful urination "for years" and there is no evidence of bacterial infection today. VSS, no fever. No concern for sepsis. She has received fluids. She is mentating well. She has been seen and evaluated by Dr. Donnald Garre and is felt stable for discharge home.   Discussed the topic of whether the family and/or the patient is considering nursing home placement and they report they have discussed possibility of placement but the patient does not want to pursue. She has been invited by family to live with them but she also refuses to move at this time. Social work was not involved for further evaluation.    Elpidio Anis, PA-C 02/28/15 [id number]  Arby Barrette, MD 03/05/15 1191  Arby Barrette, MD 03/05/15 [phone]

## 2015-02-28 NOTE — ED Notes (Signed)
Report received from [facility]  Pt is here for diarrhea,  NAD

## 2015-02-28 NOTE — ED Notes (Signed)
Patient c/o diarrhea since yesterday. Patient states she has been having urinary frequency for a week. Patient denies any blood in her stool or urine.

## 2015-03-02 LAB — URINE CULTURE: Culture: NO GROWTH

## 2015-07-13 ENCOUNTER — Emergency Department (HOSPITAL_COMMUNITY)
Admission: EM | Admit: 2015-07-13 | Discharge: 2015-07-13 | Disposition: A | Discharge status: Home / Self Care | Payer: Medicare HMO | Attending: Emergency Medicine | Admitting: Emergency Medicine

## 2015-07-13 ENCOUNTER — Encounter (HOSPITAL_COMMUNITY): Payer: Self-pay | Admitting: *Deleted

## 2015-07-13 ENCOUNTER — Emergency Department (HOSPITAL_COMMUNITY): Payer: Medicare HMO

## 2015-07-13 DIAGNOSIS — K8689 Other specified diseases of pancreas: Secondary | ICD-10-CM | POA: Diagnosis not present

## 2015-07-13 DIAGNOSIS — Z79899 Other long term (current) drug therapy: Secondary | ICD-10-CM | POA: Insufficient documentation

## 2015-07-13 DIAGNOSIS — R1030 Lower abdominal pain, unspecified: Secondary | ICD-10-CM

## 2015-07-13 DIAGNOSIS — Z8679 Personal history of other diseases of the circulatory system: Secondary | ICD-10-CM | POA: Insufficient documentation

## 2015-07-13 DIAGNOSIS — Z8719 Personal history of other diseases of the digestive system: Secondary | ICD-10-CM | POA: Diagnosis not present

## 2015-07-13 DIAGNOSIS — N390 Urinary tract infection, site not specified: Secondary | ICD-10-CM

## 2015-07-13 DIAGNOSIS — R350 Frequency of micturition: Secondary | ICD-10-CM | POA: Diagnosis present

## 2015-07-13 LAB — URINALYSIS, ROUTINE W REFLEX MICROSCOPIC
BILIRUBIN URINE: NEGATIVE
Glucose, UA: NEGATIVE mg/dL
HGB URINE DIPSTICK: NEGATIVE
KETONES UR: NEGATIVE mg/dL
Leukocytes, UA: NEGATIVE
Nitrite: POSITIVE — AB
Protein, ur: NEGATIVE mg/dL
Specific Gravity, Urine: 1.015 (ref 1.005–1.030)
pH: 7.5 (ref 5.0–8.0)

## 2015-07-13 LAB — COMPREHENSIVE METABOLIC PANEL
ALBUMIN: 4.4 g/dL (ref 3.5–5.0)
ALK PHOS: 72 U/L (ref 38–126)
ALT: 16 U/L (ref 14–54)
AST: 22 U/L (ref 15–41)
Anion gap: 12 (ref 5–15)
BUN: 21 mg/dL — AB (ref 6–20)
CALCIUM: 9.8 mg/dL (ref 8.9–10.3)
CO2: 25 mmol/L (ref 22–32)
CREATININE: 1.11 mg/dL — AB (ref 0.44–1.00)
Chloride: 104 mmol/L (ref 101–111)
GFR calc Af Amer: 49 mL/min — ABNORMAL LOW (ref >60)
GFR calc non Af Amer: 42 mL/min — ABNORMAL LOW (ref >60)
GLUCOSE: 126 mg/dL — AB (ref 65–99)
Potassium: 4.5 mmol/L (ref 3.5–5.1)
SODIUM: 141 mmol/L (ref 135–145)
Total Bilirubin: 0.9 mg/dL (ref 0.3–1.2)
Total Protein: 7.5 g/dL (ref 6.5–8.1)

## 2015-07-13 LAB — CBC
HCT: 37.2 % (ref 36.0–46.0)
Hemoglobin: 12.1 g/dL (ref 12.0–15.0)
MCH: 29.6 pg (ref 26.0–34.0)
MCHC: 32.5 g/dL (ref 30.0–36.0)
MCV: 91 fL (ref 78.0–100.0)
PLATELETS: 223 10*3/uL (ref 150–400)
RBC: 4.09 MIL/uL (ref 3.87–5.11)
RDW: 15 % (ref 11.5–15.5)
WBC: 4.5 10*3/uL (ref 4.0–10.5)

## 2015-07-13 LAB — URINE MICROSCOPIC-ADD ON

## 2015-07-13 LAB — LIPASE, BLOOD: Lipase: 22 U/L (ref 11–51)

## 2015-07-13 MED ORDER — DEXTROSE 5 % IV SOLN
1.0000 g | Freq: Once | INTRAVENOUS | Status: AC
  Administered 2015-07-13: 1 g via INTRAVENOUS
  Filled 2015-07-13: qty 10

## 2015-07-13 MED ORDER — SODIUM CHLORIDE 0.9 % IV SOLN
INTRAVENOUS | Status: DC
  Administered 2015-07-13: 13:00:00 via INTRAVENOUS

## 2015-07-13 MED ORDER — IOHEXOL 300 MG/ML  SOLN
50.0000 mL | Freq: Once | INTRAMUSCULAR | Status: DC | PRN
  Administered 2015-07-13: 50 mL via ORAL
  Filled 2015-07-13: qty 50

## 2015-07-13 MED ORDER — CEPHALEXIN 500 MG PO CAPS: 500.0000 mg | ORAL_CAPSULE | Freq: Two times a day (BID) | ORAL | Status: DC

## 2015-07-13 MED ORDER — HYDROCODONE-ACETAMINOPHEN 7.5-325 MG PO TABS
1.0000 | ORAL_TABLET | Freq: Four times a day (QID) | ORAL | Status: DC | PRN
Start: 2015-07-13 — End: 2017-11-07

## 2015-07-13 MED ORDER — MORPHINE SULFATE (PF) 4 MG/ML IV SOLN
4.0000 mg | Freq: Once | INTRAVENOUS | Status: AC
  Administered 2015-07-13: 4 mg via INTRAVENOUS
  Filled 2015-07-13: qty 1

## 2015-07-13 MED ORDER — IOHEXOL 300 MG/ML  SOLN
150.0000 mL | Freq: Once | INTRAMUSCULAR | Status: AC | PRN
  Administered 2015-07-13: 80 mL via INTRAVENOUS

## 2015-07-13 NOTE — ED Notes (Addendum)
Pt complains of 9/10 burning pain in her mid-abdomen, urinary frequency for the past 2 days. Pt denies vomiting/diarrhea. Pt denies chest pain/SOB.

## 2015-07-13 NOTE — ED Notes (Signed)
Patient presents with dysuria and abdominal pain x3 days.  Patient denies N/V/D, fever, vaginal discharge and hematuria.  Patient states she has a history of UTI and has been on antibiotics in past.  Patient denies hematochezia and melena.    General: Awake, alert, well-nourished, NAD Cardio: RRR, distant S1S2 heard, no LE edema, cyanosis; +1 radial pulses bilaterally Pulm: CTAB, no wheezes, rhonchi or rales GI: Soft, NT/ND,+BS x4

## 2015-07-13 NOTE — Discharge Instructions (Signed)
YOUR CT SCAN SHOWED A MASS ON YOUR PANCREAS. IT IS VERY IMPORTANT FOR YOU TO FOLLOW UP WITH YOUR PRIMARY CARE DOCTOR FOR FURTHER EVALUATION OF THIS MASS.

## 2015-07-13 NOTE — ED Provider Notes (Signed)
I received this patient in signout from Dr. Lynelle DoctorKnapp. Per his report, the patient presented with dysuria and lower abdominal pain for several days. Labwork showed urinary tract infection, awaiting CT scan results. 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 No other acute findings to explain the patient's symptoms. On reexamination, the patient endorses suprapubic abdominal pain which I suspect is related to her chronic cystitis problems and UTI and unrelated to her mass. I have informed the family of the pancreatic mass and emphasized importance of close PCP follow-up for further workup. Family states that she has an appointment in a few weeks but her PCP is currently out of town, therefore she is not able to obtain a refill on her prescription for Norco, which she takes daily for chronic pain. Because of upcoming holiday, I provided with short course of narcotic until patient is able to see her PCP as well as prescription for Keflex. Reviewed return precautions including any worsening pain, fevers, vomiting. Patient and family voiced understanding and patient discharged in satisfactory condition.  Laurence Spatesachel Morgan Isabel Ardila, MD 07/13/15 [phone]

## 2015-07-13 NOTE — ED Provider Notes (Signed)
CSN: [id number]     Arrival date & time 07/13/15  1112 History   First MD Initiated Contact with Patient 07/13/15 1143     Chief Complaint  Patient presents with  . Abdominal Pain  . Urinary Frequency   Patient is a 81 y.o. female presenting with abdominal pain and frequency. The history is provided by the patient.  Abdominal Pain Pain location:  Suprapubic Pain quality: burning   Pain radiates to:  Does not radiate Pain severity:  Moderate Onset quality:  Gradual Duration:  2 days Timing:  Constant Progression:  Worsening Relieved by:  Nothing Worsened by:  Urination Associated symptoms: dysuria   Associated symptoms: no constipation, no cough, no diarrhea, no fever, no nausea, no vaginal discharge and no vomiting   Urinary Frequency Associated symptoms include abdominal pain.    Past Medical History  Diagnosis Date  . Bladder disease   . Cardiomyopathy   . Diverticulosis   . Acid reflux    History reviewed. No pertinent past surgical history. No family history on file. Social History  Substance Use Topics  . Smoking status: Never Smoker   . Smokeless tobacco: Never Used  . Alcohol Use: No   OB History    No data available     Review of Systems  Constitutional: Negative for fever.  Respiratory: Negative for cough.   Gastrointestinal: Positive for abdominal pain. Negative for nausea, vomiting, diarrhea and constipation.  Genitourinary: Positive for dysuria, urgency and frequency. Negative for vaginal discharge.  All other systems reviewed and are negative.     Allergies  Review of patient's allergies indicates no known allergies.  Home Medications   Prior to Admission medications   Medication Sig Start Date End Date Taking? Authorizing Provider  amLODipine (NORVASC) 5 MG tablet Take 5 mg by mouth daily.    Historical Provider, MD  cephALEXin (KEFLEX) 500 MG capsule Take 1 capsule (500 mg total) by mouth 2 (two) times daily. Patient not taking: Reported  on 02/28/2015 12/24/14   Rolan Bucco, MD  dorzolamide-timolol (COSOPT) 22.3-6.8 MG/ML ophthalmic solution Place 1 drop into both eyes 2 (two) times daily.    Historical Provider, MD  ELMIRON 100 MG capsule Take 1 capsule by mouth 2 (two) times daily. 02/25/15   Historical Provider, MD  furosemide (LASIX) 20 MG tablet Take 20 mg by mouth daily.    Historical Provider, MD  HYDROcodone-acetaminophen (NORCO) 7.5-325 MG per tablet Take 1 tablet by mouth every 6 (six) hours as needed for moderate pain. 12/24/14   Rolan Bucco, MD  loperamide (IMODIUM) 2 MG capsule Take 1 capsule (2 mg total) by mouth 4 (four) times daily as needed for diarrhea or loose stools. 02/28/15   Elpidio Anis, PA-C  LORazepam (ATIVAN) 1 MG tablet Take 1 mg by mouth at bedtime.    Historical Provider, MD  lubiprostone (AMITIZA) 24 MCG capsule Take 24 mcg by mouth daily as needed for constipation (IBS).     Historical Provider, MD  meclizine (ANTIVERT) 12.5 MG tablet Take 1 tablet (12.5 mg total) by mouth 3 (three) times daily as needed for dizziness. 10/03/13   Richardean Canal, MD  phenazopyridine (PYRIDIUM) 100 MG tablet Take 1 tablet (100 mg total) by mouth 3 (three) times daily. 02/28/15   Shari Upstill, PA-C   BP 167/73 mmHg  Pulse 85  Temp(Src) 98.3 F (36.8 C) (Oral)  Resp 14  SpO2 97% Physical Exam  Constitutional: No distress.  elderly  HENT:  Head: Normocephalic and atraumatic.  Right Ear: External ear normal.  Left Ear: External ear normal.  Eyes: Conjunctivae are normal. Right eye exhibits no discharge. Left eye exhibits no discharge. No scleral icterus.  Neck: Neck supple. No tracheal deviation present.  Cardiovascular: Normal rate, regular rhythm and intact distal pulses.   Pulmonary/Chest: Effort normal and breath sounds normal. No stridor. No respiratory distress. She has no wheezes. She has no rales.  Abdominal: Soft. Bowel sounds are normal. She exhibits no distension. There is no tenderness. There is no rebound and  no guarding.  Musculoskeletal: She exhibits no edema or tenderness.  Neurological: She is alert. No cranial nerve deficit (no facial droop, extraocular movements intact, no slurred speech) or sensory deficit. She exhibits normal muscle tone. She displays no seizure activity. Coordination normal.  General weakness  Skin: Skin is warm and dry. No rash noted.  Psychiatric: She has a normal mood and affect.  Nursing note and vitals reviewed.   ED Course  Procedures (including critical care time) Labs Review Labs Reviewed  COMPREHENSIVE METABOLIC PANEL - Abnormal; Notable for the following:    Glucose, Bld 126 (*)    BUN 21 (*)    Creatinine, Ser 1.11 (*)    GFR calc non Af Amer 42 (*)    GFR calc Af Amer 49 (*)    All other components within normal limits  URINALYSIS, ROUTINE W REFLEX MICROSCOPIC (NOT AT [facility]) - Abnormal; Notable for the following:    APPearance CLOUDY (*)    Nitrite POSITIVE (*)    All other components within normal limits  URINE MICROSCOPIC-ADD ON - Abnormal; Notable for the following:    Squamous Epithelial / LPF 0-5 (*)    Bacteria, UA MANY (*)    All other components within normal limits  URINE CULTURE  LIPASE, BLOOD  CBC   I have personally reviewed and evaluated these images and lab results as part of my medical decision-making.   MDM   Pt presented to the ED with complaints of urinary frequency.  UA with positive nitrite and many bacteria.  Possible UTI.  CT of abdomen ordered to evaluate further, ie diverticulitis, abdominal abscess or other acute complication.   Dr Clarene DukeLittle will follow up on the CT scan.  Plan on treatment of UTI if CT scan otherwise unremarkable.    Linwood DibblesJon Khaleef Ruby, MD 07/14/15 [phone]

## 2015-07-16 LAB — URINE CULTURE: Culture: >=100000

## 2015-07-17 ENCOUNTER — Telehealth (HOSPITAL_COMMUNITY): Payer: Self-pay

## 2015-07-17 NOTE — Telephone Encounter (Signed)
Post ED Visit - Positive Culture Follow-up  Culture report reviewed by antimicrobial stewardship pharmacist:  []  Enzo BiNathan Batchelder, Pharm.D. []  Celedonio MiyamotoJeremy Frens, Pharm.D., BCPS []  Garvin FilaMike Maccia, Pharm.D. []  Georgina PillionElizabeth Martin, Pharm.D., BCPS []  MagnessMinh Pham, [place]Pharm.D., BCPS, AAHIVP []  Estella HuskMichelle Turner, Pharm.D., BCPS, AAHIVP []  Tennis Mustassie Stewart, [address]D. []  Sherle Poeob Vincent, [address]Lanetta Inch. X  Allison Masters, Pharm.D.  Positive urine culture, >/= 100,000 colonies -> Klebsiella Pneumoniae   Treated with Cephalexin, organism sensitive to the same and no further patient follow-up is required at this time.  Arvid RightClark, Jeremiah Tarpley Dorn 07/17/2015, 6:45 PM

## 2015-08-07 ENCOUNTER — Encounter (HOSPITAL_BASED_OUTPATIENT_CLINIC_OR_DEPARTMENT_OTHER): Payer: Self-pay | Admitting: *Deleted

## 2015-08-07 ENCOUNTER — Emergency Department (HOSPITAL_BASED_OUTPATIENT_CLINIC_OR_DEPARTMENT_OTHER)
Admission: EM | Admit: 2015-08-07 | Discharge: 2015-08-07 | Disposition: A | Discharge status: Home / Self Care | Payer: Medicare Other | Attending: Emergency Medicine | Admitting: Emergency Medicine

## 2015-08-07 DIAGNOSIS — Z79899 Other long term (current) drug therapy: Secondary | ICD-10-CM | POA: Diagnosis not present

## 2015-08-07 DIAGNOSIS — R300 Dysuria: Secondary | ICD-10-CM | POA: Diagnosis present

## 2015-08-07 DIAGNOSIS — Z8719 Personal history of other diseases of the digestive system: Secondary | ICD-10-CM | POA: Insufficient documentation

## 2015-08-07 DIAGNOSIS — Z792 Long term (current) use of antibiotics: Secondary | ICD-10-CM | POA: Diagnosis not present

## 2015-08-07 DIAGNOSIS — N390 Urinary tract infection, site not specified: Secondary | ICD-10-CM | POA: Diagnosis not present

## 2015-08-07 LAB — URINALYSIS, ROUTINE W REFLEX MICROSCOPIC
Bilirubin Urine: NEGATIVE
GLUCOSE, UA: NEGATIVE mg/dL
Hgb urine dipstick: NEGATIVE
Ketones, ur: NEGATIVE mg/dL
NITRITE: NEGATIVE
PH: 7 (ref 5.0–8.0)
Protein, ur: NEGATIVE mg/dL
Specific Gravity, Urine: 1.01 (ref 1.005–1.030)

## 2015-08-07 LAB — URINE MICROSCOPIC-ADD ON: RBC / HPF: NONE SEEN RBC/hpf (ref 0–5)

## 2015-08-07 MED ORDER — CEPHALEXIN 500 MG PO CAPS: 500.0000 mg | ORAL_CAPSULE | Freq: Four times a day (QID) | ORAL | Status: DC

## 2015-08-07 NOTE — ED Notes (Signed)
HIGH FALL RISK intervention in place

## 2015-08-07 NOTE — Discharge Instructions (Signed)
Take the antibiotic as directed for the urinary tract infection. If you are not improving in 2 days then follow-up here or with your regular doctor. Return for any new or worse symptoms. Urine culture sent.

## 2015-08-07 NOTE — ED Provider Notes (Signed)
CSN: [id number]     Arrival date & time 08/07/15  0746 History   First MD Initiated Contact with Patient 08/07/15 [phone]     Chief Complaint  Patient presents with  . burning with urination      (Consider location/radiation/quality/duration/timing/severity/associated sxs/prior Treatment) The history is provided by the patient and a friend.   82 year old female with history of frequent urinary tract infections. Brought in by neighbor. Patient with complaint of dysuria and frequency for the past 3 days. Associated with some lower abdominal discomfort. No nausea vomiting or diarrhea no fevers. No back pain. Patient last treated for urinary tract infection December 21. Treated with Keflex with improvement.  Past Medical History  Diagnosis Date  . Bladder disease   . Cardiomyopathy   . Diverticulosis   . Acid reflux    History reviewed. No pertinent past surgical history. No family history on file. Social History  Substance Use Topics  . Smoking status: Never Smoker   . Smokeless tobacco: Never Used  . Alcohol Use: No   OB History    No data available     Review of Systems  Constitutional: Negative for fever and chills.  Eyes: Negative for redness.  Respiratory: Negative for shortness of breath.   Cardiovascular: Negative for chest pain.  Gastrointestinal: Positive for abdominal pain. Negative for nausea, vomiting and diarrhea.  Genitourinary: Positive for dysuria and frequency. Negative for vaginal discharge.  Musculoskeletal: Negative for back pain.  Skin: Negative for rash.  Neurological: Negative for headaches.  Hematological: Does not bruise/bleed easily.  Psychiatric/Behavioral: Negative for confusion.      Allergies  Review of patient's allergies indicates no known allergies.  Home Medications   Prior to Admission medications   Medication Sig Start Date End Date Taking? Authorizing Provider  amLODipine (NORVASC) 5 MG tablet Take 5 mg by mouth daily.     Historical Provider, MD  cephALEXin (KEFLEX) 500 MG capsule Take 1 capsule (500 mg total) by mouth 2 (two) times daily. 07/13/15   Laurence Spatesachel Morgan Little, MD  cephALEXin (KEFLEX) 500 MG capsule Take 1 capsule (500 mg total) by mouth 4 (four) times daily. 08/07/15   Vanetta MuldersScott Kilea Mccarey, MD  dorzolamide-timolol (COSOPT) 22.3-6.8 MG/ML ophthalmic solution Place 1 drop into both eyes 2 (two) times daily.    Historical Provider, MD  furosemide (LASIX) 20 MG tablet Take 20 mg by mouth daily.    Historical Provider, MD  HYDROcodone-acetaminophen (NORCO) 7.5-325 MG tablet Take 1 tablet by mouth every 6 (six) hours as needed for moderate pain. 07/13/15   Laurence Spatesachel Morgan Little, MD  loperamide (IMODIUM) 2 MG capsule Take 1 capsule (2 mg total) by mouth 4 (four) times daily as needed for diarrhea or loose stools. 02/28/15   Elpidio AnisShari Upstill, PA-C  LORazepam (ATIVAN) 1 MG tablet Take 1 mg by mouth at bedtime.    Historical Provider, MD  meclizine (ANTIVERT) 12.5 MG tablet Take 1 tablet (12.5 mg total) by mouth 3 (three) times daily as needed for dizziness. Patient not taking: Reported on 07/13/2015 10/03/13   Richardean Canalavid H Yao, MD  phenazopyridine (PYRIDIUM) 100 MG tablet Take 1 tablet (100 mg total) by mouth 3 (three) times daily. Patient not taking: Reported on 07/13/2015 02/28/15   Elpidio AnisShari Upstill, PA-C   BP 144/59 mmHg  Pulse 95  Temp(Src) 98.3 F (36.8 C) (Oral)  Resp 18  Ht 5\' 3"  (1.6 m)  Wt 63.05 kg  BMI 24.63 kg/m2  SpO2 99% Physical Exam  Constitutional: She is oriented to  person, place, and time. She appears well-developed and well-nourished. No distress.  HENT:  Head: Normocephalic and atraumatic.  Mouth/Throat: Oropharynx is clear and moist.  Eyes: Conjunctivae and EOM are normal. Pupils are equal, round, and reactive to light.  Neck: Normal range of motion. Neck supple.  Cardiovascular: Normal rate, regular rhythm and normal heart sounds.   Pulmonary/Chest: Effort normal and breath sounds normal. No  respiratory distress.  Abdominal: Soft. Bowel sounds are normal. There is no tenderness.  Musculoskeletal: Normal range of motion.  Neurological: She is alert and oriented to person, place, and time. No cranial nerve deficit. She exhibits normal muscle tone. Coordination normal.  Skin: Skin is warm. No rash noted.  Nursing note and vitals reviewed.   ED Course  Procedures (including critical care time) Labs Review Labs Reviewed  URINALYSIS, ROUTINE W REFLEX MICROSCOPIC (NOT AT [facility]) - Abnormal; Notable for the following:    Leukocytes, UA MODERATE (*)    All other components within normal limits  URINE MICROSCOPIC-ADD ON - Abnormal; Notable for the following:    Squamous Epithelial / LPF 0-5 (*)    Bacteria, UA MANY (*)    All other components within normal limits  URINE CULTURE    Imaging Review No results found. I have personally reviewed and evaluated these images and lab results as part of my medical decision-making.   EKG Interpretation None      MDM   Final diagnoses:  UTI (lower urinary tract infection)    Patient with history of frequent urinary tract infections. Patient with complaint of dysuria and burning for the past 3 days. No nausea vomiting or diarrhea. No back pain no fevers. Patient did get improved from her previous treatment on December 21. Patient was treated with Keflex at that time. Urine culture sent for confirmation will treat with Keflex again. Patient doesn't show signs of improvement in 2 days will follow-up. Nontoxic no acute distress.    Vanetta Mulders, MD 08/07/15 [phone]

## 2015-08-07 NOTE — ED Notes (Signed)
MD at bedside. 

## 2015-08-07 NOTE — ED Notes (Signed)
Pt states is having "burining upon urination" states also having urinary frequency, [facility]

## 2015-08-07 NOTE — ED Notes (Signed)
Having some Nausea, but no back pain or fevers

## 2015-08-08 LAB — URINE CULTURE

## 2015-09-09 ENCOUNTER — Emergency Department (HOSPITAL_COMMUNITY)
Admission: EM | Admit: 2015-09-09 | Discharge: 2015-09-09 | Disposition: A | Discharge status: Home / Self Care | Payer: Medicare Other | Attending: Emergency Medicine | Admitting: Emergency Medicine

## 2015-09-09 ENCOUNTER — Encounter (HOSPITAL_COMMUNITY): Payer: Self-pay | Admitting: Emergency Medicine

## 2015-09-09 DIAGNOSIS — Z87448 Personal history of other diseases of urinary system: Secondary | ICD-10-CM | POA: Diagnosis not present

## 2015-09-09 DIAGNOSIS — Z760 Encounter for issue of repeat prescription: Secondary | ICD-10-CM | POA: Diagnosis not present

## 2015-09-09 DIAGNOSIS — G8929 Other chronic pain: Secondary | ICD-10-CM | POA: Diagnosis not present

## 2015-09-09 DIAGNOSIS — Z8679 Personal history of other diseases of the circulatory system: Secondary | ICD-10-CM | POA: Insufficient documentation

## 2015-09-09 DIAGNOSIS — Z79899 Other long term (current) drug therapy: Secondary | ICD-10-CM | POA: Insufficient documentation

## 2015-09-09 DIAGNOSIS — G478 Other sleep disorders: Secondary | ICD-10-CM | POA: Diagnosis not present

## 2015-09-09 DIAGNOSIS — Z8719 Personal history of other diseases of the digestive system: Secondary | ICD-10-CM | POA: Diagnosis not present

## 2015-09-09 DIAGNOSIS — R1084 Generalized abdominal pain: Secondary | ICD-10-CM | POA: Insufficient documentation

## 2015-09-09 DIAGNOSIS — Z792 Long term (current) use of antibiotics: Secondary | ICD-10-CM | POA: Diagnosis not present

## 2015-09-09 MED ORDER — HYDROCODONE-ACETAMINOPHEN 7.5-325 MG PO TABS: 1.0000 | ORAL_TABLET | Freq: Four times a day (QID) | ORAL | Status: DC | PRN

## 2015-09-09 MED ORDER — HYDROCODONE-ACETAMINOPHEN 7.5-325 MG PO TABS
1.0000 | ORAL_TABLET | Freq: Once | ORAL | Status: AC
  Administered 2015-09-09: 1 via ORAL
  Filled 2015-09-09: qty 1

## 2015-09-09 NOTE — Discharge Instructions (Signed)
You have been seen today for a medication refill. Keep your appointment for follow-up with Dr. Petra Kuba on Monday, February 20. Return to ED should symptoms worsen.

## 2015-09-09 NOTE — ED Notes (Signed)
Counsellor given friend at the bedside.

## 2015-09-09 NOTE — ED Notes (Signed)
Friend at bedside stated that she is not a caregiver but will provide transportation for [name]. Pt restated that he stomach hurts "like it always does"

## 2015-09-09 NOTE — ED Notes (Signed)
Per family member, states she is out of her hydrocodone-states when she runs out they come here for a refill-states Dr Petra Kuba is her PCP but doesn't see them till next week-has been on med for many years-patient states no abdominal pain, just "feels funny"

## 2015-09-09 NOTE — ED Provider Notes (Signed)
CSN: [id number]     Arrival date & time 09/09/15  1113 History  By signing my name below, I, Placido Sou, attest that this documentation has been prepared under the direction and in the presence of Shawn C. Joy, PA-C. Electronically Signed: Placido Sou, ED Scribe. 09/09/2015. 12:31 PM.   Chief Complaint  Patient presents with  . Medication Refill   The history is provided by the patient and a friend. No language interpreter was used.    HPI Comments: FRANCYNE ARREAGA is a 82 y.o. female with a PMHx of bladder disease, cardiomyopathy and diverticulosis who presents to the Emergency Department requesting a refill of hydrocodone for management of her chronic abd pain onset years ago that worsened beginning earlier today. Pt states that her PCP (Dr. Petra Kuba) prescribes her hydrocodone and that she cannot be seen until next week. Pt reports associated decreased appetite, difficulty sleeping and rates her current pain as 9/10. She denies constipation, difficulty urinating, hematuria, fever, chills or any other associated symptoms at this time.    Past Medical History  Diagnosis Date  . Bladder disease   . Cardiomyopathy   . Diverticulosis   . Acid reflux    History reviewed. No pertinent past surgical history. No family history on file. Social History  Substance Use Topics  . Smoking status: Never Smoker   . Smokeless tobacco: Never Used  . Alcohol Use: No   OB History    No data available     Review of Systems  Constitutional: Negative for fever and chills.  Gastrointestinal: Positive for abdominal pain (Generalized, chronic). Negative for nausea, vomiting, diarrhea, constipation and blood in stool.  Genitourinary: Negative for hematuria, decreased urine volume and difficulty urinating.  Skin: Negative for color change and pallor.  Psychiatric/Behavioral: Positive for sleep disturbance.  All other systems reviewed and are negative.   Allergies  Review of patient's  allergies indicates no known allergies.  Home Medications   Prior to Admission medications   Medication Sig Start Date End Date Taking? Authorizing Provider  amLODipine (NORVASC) 5 MG tablet Take 5 mg by mouth daily.    Historical Provider, MD  cephALEXin (KEFLEX) 500 MG capsule Take 1 capsule (500 mg total) by mouth 2 (two) times daily. 07/13/15   Laurence Spates, MD  cephALEXin (KEFLEX) 500 MG capsule Take 1 capsule (500 mg total) by mouth 4 (four) times daily. 08/07/15   Vanetta Mulders, MD  dorzolamide-timolol (COSOPT) 22.3-6.8 MG/ML ophthalmic solution Place 1 drop into both eyes 2 (two) times daily.    Historical Provider, MD  furosemide (LASIX) 20 MG tablet Take 20 mg by mouth daily.    Historical Provider, MD  HYDROcodone-acetaminophen (NORCO) 7.5-325 MG tablet Take 1 tablet by mouth every 6 (six) hours as needed for moderate pain. 07/13/15   Laurence Spates, MD  HYDROcodone-acetaminophen (NORCO) 7.5-325 MG tablet Take 1 tablet by mouth every 6 (six) hours as needed for moderate pain. 09/09/15   Shawn C Joy, PA-C  loperamide (IMODIUM) 2 MG capsule Take 1 capsule (2 mg total) by mouth 4 (four) times daily as needed for diarrhea or loose stools. 02/28/15   Elpidio Anis, PA-C  LORazepam (ATIVAN) 1 MG tablet Take 1 mg by mouth at bedtime.    Historical Provider, MD   BP 121/79 mmHg  Pulse 108  Temp(Src) 97.7 F (36.5 C) (Oral)  Resp 16  SpO2 98%    Physical Exam  Constitutional: She is oriented to person, place, and time. She appears  well-developed and well-nourished.  HENT:  Head: Normocephalic and atraumatic.  Eyes: Conjunctivae are normal. Pupils are equal, round, and reactive to light.  Neck: Normal range of motion. Neck supple.  Cardiovascular: Normal rate, regular rhythm, normal heart sounds and intact distal pulses.   Pulmonary/Chest: Effort normal and breath sounds normal. No respiratory distress.  Abdominal: Soft. Bowel sounds are normal. She exhibits no  distension. There is no tenderness.  Musculoskeletal: Normal range of motion.  Neurological: She is alert and oriented to person, place, and time.  Skin: Skin is warm and dry.  Psychiatric: She has a normal mood and affect.  Nursing note and vitals reviewed.   ED Course  Procedures  DIAGNOSTIC STUDIES: Oxygen Saturation is 98% on RA, normal by my interpretation.    COORDINATION OF CARE: 12:27 PM Discussed next steps with pt. She verbalized understanding and is agreeable with the plan.   Labs Review Labs Reviewed - No data to display  Imaging Review No results found.   EKG Interpretation None      MDM   Final diagnoses:  Medication refill    PA TENNANT presents for refill of her Vicodin that she uses for chronic abdominal pain.  Findings and plan of care discussed with Gerhard Munch, MD. Dr. Jeraldine Loots evaluated this patient personally.  A full exam was performed on this patient due to her complaint of abdominal pain, despite it being reported as chronic. Dr. Jeraldine Loots and I agreed that it would be difficult for the patient to have adequate pain relief over the weekend and thus a small refill may be warranted. [place] Controlled Substance database shows patient last received her Hydrocodone-APAP 7.5-325mg  on July 28, 2015. Pt received 140 pills. This was a 30 day supply, which means that the patient would logically be out of this medication with prescribed use. Patient has an appointment with Dr. Petra Kuba on Monday, February 20. Patient was encouraged to keep this appointment.  I personally performed the services described in this documentation, which was scribed in my presence. The recorded information has been reviewed and is accurate.    Anselm Pancoast, PA-C 09/09/15 1317  Gerhard Munch, MD 09/09/15 [phone]

## 2016-06-15 ENCOUNTER — Encounter (HOSPITAL_BASED_OUTPATIENT_CLINIC_OR_DEPARTMENT_OTHER): Payer: Self-pay | Admitting: Emergency Medicine

## 2016-06-15 ENCOUNTER — Emergency Department (HOSPITAL_BASED_OUTPATIENT_CLINIC_OR_DEPARTMENT_OTHER)
Admission: EM | Admit: 2016-06-15 | Discharge: 2016-06-15 | Disposition: A | Discharge status: Home / Self Care | Payer: Medicare Other | Attending: Emergency Medicine | Admitting: Emergency Medicine

## 2016-06-15 DIAGNOSIS — N390 Urinary tract infection, site not specified: Secondary | ICD-10-CM | POA: Diagnosis not present

## 2016-06-15 DIAGNOSIS — R300 Dysuria: Secondary | ICD-10-CM | POA: Diagnosis present

## 2016-06-15 DIAGNOSIS — Z79899 Other long term (current) drug therapy: Secondary | ICD-10-CM | POA: Diagnosis not present

## 2016-06-15 LAB — URINALYSIS, ROUTINE W REFLEX MICROSCOPIC
Bilirubin Urine: NEGATIVE
Glucose, UA: NEGATIVE mg/dL
Hgb urine dipstick: NEGATIVE
KETONES UR: NEGATIVE mg/dL
Nitrite: NEGATIVE
PROTEIN: NEGATIVE mg/dL
Specific Gravity, Urine: 1.008 (ref 1.005–1.030)
pH: 6 (ref 5.0–8.0)

## 2016-06-15 LAB — BASIC METABOLIC PANEL
Anion gap: 8 (ref 5–15)
BUN: 20 mg/dL (ref 6–20)
CHLORIDE: 105 mmol/L (ref 101–111)
CO2: 25 mmol/L (ref 22–32)
Calcium: 9.2 mg/dL (ref 8.9–10.3)
Creatinine, Ser: 1.18 mg/dL — ABNORMAL HIGH (ref 0.44–1.00)
GFR, EST AFRICAN AMERICAN: 45 mL/min — AB (ref >60)
GFR, EST NON AFRICAN AMERICAN: 39 mL/min — AB (ref >60)
Glucose, Bld: 125 mg/dL — ABNORMAL HIGH (ref 65–99)
Potassium: 4.3 mmol/L (ref 3.5–5.1)
SODIUM: 138 mmol/L (ref 135–145)

## 2016-06-15 LAB — URINE MICROSCOPIC-ADD ON: RBC / HPF: NONE SEEN RBC/hpf (ref 0–5)

## 2016-06-15 LAB — CBC
HCT: 33.8 % — ABNORMAL LOW (ref 36.0–46.0)
HEMOGLOBIN: 11.2 g/dL — AB (ref 12.0–15.0)
MCH: 30 pg (ref 26.0–34.0)
MCHC: 33.1 g/dL (ref 30.0–36.0)
MCV: 90.6 fL (ref 78.0–100.0)
Platelets: 254 10*3/uL (ref 150–400)
RBC: 3.73 MIL/uL — ABNORMAL LOW (ref 3.87–5.11)
RDW: 14.8 % (ref 11.5–15.5)
WBC: 4.7 10*3/uL (ref 4.0–10.5)

## 2016-06-15 MED ORDER — CEPHALEXIN 500 MG PO CAPS: 500.0000 mg | ORAL_CAPSULE | Freq: Three times a day (TID) | ORAL | 0 refills | Status: DC

## 2016-06-15 NOTE — Discharge Instructions (Signed)
Return to the ED with any concerns including fever/chills, vomiting and not able to keep down liquids or antibiotics, decreased level of alertness/lethargy, or any other alarming symptoms

## 2016-06-15 NOTE — ED Triage Notes (Signed)
Pt c/o burning with urination x few days; sts chronic issue, but worse now; also reports frequency

## 2016-06-15 NOTE — ED Notes (Signed)
IV/Labs attempted x 3 (this RN x 2; another RN x 1)- unable to obtain.

## 2016-06-15 NOTE — ED Provider Notes (Signed)
[facility] Provider Note   CSN: [id number] Arrival date & time: 06/15/16  [id number]     History   Chief Complaint Chief Complaint  Patient presents with  . Dysuria    HPI [name] is a 82 y.o. female.  HPI  Pt presenting with c/o burning with urination.  Pt states pain is burning and feels "on fire" when she passes urine.  She states she gets frequent UTIs.  Does not drink much fluids in general- no acute change.  No vomiting or fevers. No back or abdominal pain. Has not seen blood in urine.  Has not had any treatment prior to arrival.  Symptoms have been ongoing for the past 2-3 daysThere are no other associated systemic symptoms, there are no other alleviating or modifying factors.   Past Medical History:  Diagnosis Date  . Acid reflux   . Bladder disease   . Cardiomyopathy   . Diverticulosis     There are no active problems to display for this patient.   History reviewed. No pertinent surgical history.  OB History    No data available       Home Medications    Prior to Admission medications   Medication Sig Start Date End Date Taking? Authorizing Provider  amLODipine (NORVASC) 5 MG tablet Take 5 mg by mouth daily.    Historical Provider, MD  cephALEXin (KEFLEX) 500 MG capsule Take 1 capsule (500 mg total) by mouth 3 (three) times daily. 06/15/16   Jerelyn ScottMartha Linker, MD  dorzolamide-timolol (COSOPT) 22.3-6.8 MG/ML ophthalmic solution Place 1 drop into both eyes 2 (two) times daily.    Historical Provider, MD  furosemide (LASIX) 20 MG tablet Take 20 mg by mouth daily.    Historical Provider, MD  HYDROcodone-acetaminophen (NORCO) 7.5-325 MG tablet Take 1 tablet by mouth every 6 (six) hours as needed for moderate pain. 07/13/15   Laurence Spatesachel Morgan Little, MD  HYDROcodone-acetaminophen (NORCO) 7.5-325 MG tablet Take 1 tablet by mouth every 6 (six) hours as needed for moderate pain. 09/09/15   Shawn C Joy, PA-C  loperamide (IMODIUM) 2 MG capsule Take 1 capsule (2  mg total) by mouth 4 (four) times daily as needed for diarrhea or loose stools. 02/28/15   Elpidio AnisShari Upstill, PA-C  LORazepam (ATIVAN) 1 MG tablet Take 1 mg by mouth at bedtime.    Historical Provider, MD    Family History History reviewed. No pertinent family history.  Social History Social History  Substance Use Topics  . Smoking status: Never Smoker  . Smokeless tobacco: Never Used  . Alcohol use No     Allergies   Patient has no known allergies.   Review of Systems Review of Systems  ROS reviewed and all otherwise negative except for mentioned in HPI    Physical Exam Updated Vital Signs BP (!) 153/51 (BP Location: Right Arm)   Pulse 64   Temp 97.8 F (36.6 C) (Oral)   Resp 18   Ht 5\' 3"  (1.6 m)   Wt 62.6 kg   SpO2 100%   BMI 24.45 kg/m  Vitals reviewed Physical Exam Physical Examination: General appearance - alert, well appearing, and in no distress Mental status - alert, oriented to person, place, and time Eyes - no conjunctival injection, no scleral icterus Mouth - mucous membranes moist, pharynx normal without lesions Chest - clear to auscultation, no wheezes, rales or rhonchi, symmetric air entry Heart - normal rate, regular rhythm, normal S1, S2, no murmurs, rubs, clicks or gallops Abdomen -  soft, nontender, nondistended, no masses or organomegaly Neurological - alert, oriented, normal speech Extremities - peripheral pulses normal, no pedal edema, no clubbing or cyanosis Skin - normal coloration and turgor, no rashes  ED Treatments / Results  Labs (all labs ordered are listed, but only abnormal results are displayed) Labs Reviewed  URINALYSIS, ROUTINE W REFLEX MICROSCOPIC (NOT AT [facility]) - Abnormal; Notable for the following:       Result Value   Leukocytes, UA MODERATE (*)    All other components within normal limits  CBC - Abnormal; Notable for the following:    RBC 3.73 (*)    Hemoglobin 11.2 (*)    HCT 33.8 (*)    All other components within normal  limits  BASIC METABOLIC PANEL - Abnormal; Notable for the following:    Glucose, Bld 125 (*)    Creatinine, Ser 1.18 (*)    GFR calc non Af Amer 39 (*)    GFR calc Af Amer 45 (*)    All other components within normal limits  URINE MICROSCOPIC-ADD ON - Abnormal; Notable for the following:    Squamous Epithelial / LPF 0-5 (*)    Bacteria, UA MANY (*)    All other components within normal limits    EKG  EKG Interpretation None       Radiology No results found.  Procedures Procedures (including critical care time)  Medications Ordered in ED Medications - No data to display   Initial Impression / Assessment and Plan / ED Course  I have reviewed the triage vital signs and the nursing notes.  Pertinent labs & imaging results that were available during my care of the patient were reviewed by me and considered in my medical decision making (see chart for details).  Clinical Course     Pt presenting with dysuria- pt has similar sympotms in past with UTIs.  Urine is positive for findings of UTI- also sent urine culture.  Pt treated with keflex.  Discharged with strict return precautions.  Pt agreeable with plan.  Final Clinical Impressions(s) / ED Diagnoses   Final diagnoses:  Lower urinary tract infectious disease    New Prescriptions Discharge Medication List as of 06/15/2016 12:50 PM       Jerelyn ScottMartha Linker, MD 06/16/16 1157

## 2016-06-18 ENCOUNTER — Encounter (HOSPITAL_BASED_OUTPATIENT_CLINIC_OR_DEPARTMENT_OTHER): Payer: Self-pay | Admitting: Emergency Medicine

## 2016-06-18 ENCOUNTER — Emergency Department (HOSPITAL_BASED_OUTPATIENT_CLINIC_OR_DEPARTMENT_OTHER)
Admission: EM | Admit: 2016-06-18 | Discharge: 2016-06-18 | Disposition: A | Discharge status: Home / Self Care | Payer: Medicare Other | Attending: Emergency Medicine | Admitting: Emergency Medicine

## 2016-06-18 ENCOUNTER — Emergency Department (HOSPITAL_BASED_OUTPATIENT_CLINIC_OR_DEPARTMENT_OTHER): Payer: Medicare Other

## 2016-06-18 DIAGNOSIS — G500 Trigeminal neuralgia: Secondary | ICD-10-CM | POA: Diagnosis not present

## 2016-06-18 DIAGNOSIS — N3000 Acute cystitis without hematuria: Secondary | ICD-10-CM | POA: Diagnosis not present

## 2016-06-18 DIAGNOSIS — R1030 Lower abdominal pain, unspecified: Secondary | ICD-10-CM | POA: Diagnosis present

## 2016-06-18 DIAGNOSIS — N289 Disorder of kidney and ureter, unspecified: Secondary | ICD-10-CM | POA: Diagnosis not present

## 2016-06-18 DIAGNOSIS — K869 Disease of pancreas, unspecified: Secondary | ICD-10-CM | POA: Insufficient documentation

## 2016-06-18 DIAGNOSIS — Z79899 Other long term (current) drug therapy: Secondary | ICD-10-CM | POA: Insufficient documentation

## 2016-06-18 DIAGNOSIS — D649 Anemia, unspecified: Secondary | ICD-10-CM | POA: Diagnosis not present

## 2016-06-18 DIAGNOSIS — K8689 Other specified diseases of pancreas: Secondary | ICD-10-CM

## 2016-06-18 LAB — URINALYSIS, ROUTINE W REFLEX MICROSCOPIC
BILIRUBIN URINE: NEGATIVE
GLUCOSE, UA: NEGATIVE mg/dL
HGB URINE DIPSTICK: NEGATIVE
KETONES UR: NEGATIVE mg/dL
Nitrite: NEGATIVE
PROTEIN: NEGATIVE mg/dL
Specific Gravity, Urine: 1.024 (ref 1.005–1.030)
pH: 6 (ref 5.0–8.0)

## 2016-06-18 LAB — CBC WITH DIFFERENTIAL/PLATELET
BASOS ABS: 0 10*3/uL (ref 0.0–0.1)
Basophils Relative: 0 %
EOS PCT: 2 %
Eosinophils Absolute: 0.1 10*3/uL (ref 0.0–0.7)
HCT: 32.8 % — ABNORMAL LOW (ref 36.0–46.0)
Hemoglobin: 11.2 g/dL — ABNORMAL LOW (ref 12.0–15.0)
LYMPHS ABS: 1.2 10*3/uL (ref 0.7–4.0)
LYMPHS PCT: 23 %
MCH: 30.4 pg (ref 26.0–34.0)
MCHC: 34.1 g/dL (ref 30.0–36.0)
MCV: 88.9 fL (ref 78.0–100.0)
Monocytes Absolute: 0.5 10*3/uL (ref 0.1–1.0)
Monocytes Relative: 10 %
Neutro Abs: 3.6 10*3/uL (ref 1.7–7.7)
Neutrophils Relative %: 65 %
PLATELETS: 235 10*3/uL (ref 150–400)
RBC: 3.69 MIL/uL — ABNORMAL LOW (ref 3.87–5.11)
RDW: 14.1 % (ref 11.5–15.5)
WBC: 5.4 10*3/uL (ref 4.0–10.5)

## 2016-06-18 LAB — COMPREHENSIVE METABOLIC PANEL
ALK PHOS: 52 U/L (ref 38–126)
ALT: 14 U/L (ref 14–54)
AST: 25 U/L (ref 15–41)
Albumin: 4.1 g/dL (ref 3.5–5.0)
Anion gap: 8 (ref 5–15)
BILIRUBIN TOTAL: 0.5 mg/dL (ref 0.3–1.2)
BUN: 19 mg/dL (ref 6–20)
CHLORIDE: 96 mmol/L — AB (ref 101–111)
CO2: 26 mmol/L (ref 22–32)
CREATININE: 1.12 mg/dL — AB (ref 0.44–1.00)
Calcium: 9.5 mg/dL (ref 8.9–10.3)
GFR calc Af Amer: 48 mL/min — ABNORMAL LOW (ref >60)
GFR, EST NON AFRICAN AMERICAN: 41 mL/min — AB (ref >60)
Glucose, Bld: 112 mg/dL — ABNORMAL HIGH (ref 65–99)
Potassium: 4.2 mmol/L (ref 3.5–5.1)
Sodium: 130 mmol/L — ABNORMAL LOW (ref 135–145)
Total Protein: 6.9 g/dL (ref 6.5–8.1)

## 2016-06-18 LAB — URINE MICROSCOPIC-ADD ON

## 2016-06-18 LAB — LIPASE, BLOOD: LIPASE: 28 U/L (ref 11–51)

## 2016-06-18 MED ORDER — MORPHINE SULFATE (PF) 4 MG/ML IV SOLN
4.0000 mg | Freq: Once | INTRAVENOUS | Status: AC
  Administered 2016-06-18: 4 mg via INTRAVENOUS
  Filled 2016-06-18: qty 1

## 2016-06-18 MED ORDER — CIPROFLOXACIN HCL 500 MG PO TABS: 500.0000 mg | ORAL_TABLET | Freq: Two times a day (BID) | ORAL | 0 refills | Status: DC

## 2016-06-18 MED ORDER — CIPROFLOXACIN HCL 500 MG PO TABS
500.0000 mg | ORAL_TABLET | Freq: Once | ORAL | Status: AC
  Administered 2016-06-18: 500 mg via ORAL
  Filled 2016-06-18: qty 1

## 2016-06-18 MED ORDER — CARBAMAZEPINE 200 MG PO TABS
200.0000 mg | ORAL_TABLET | Freq: Once | ORAL | Status: AC
  Administered 2016-06-18: 200 mg via ORAL
  Filled 2016-06-18: qty 1

## 2016-06-18 MED ORDER — IOPAMIDOL (ISOVUE-300) INJECTION 61%
100.0000 mL | Freq: Once | INTRAVENOUS | Status: AC | PRN
  Administered 2016-06-18: 100 mL via INTRAVENOUS

## 2016-06-18 NOTE — ED Provider Notes (Signed)
[facility]   CSN: [id number] Arrival date & time: 06/18/16  [id number]     History   Chief Complaint Chief Complaint  Patient presents with  . Abdominal Pain    HPI [name] is a 82 y.o. female.  She complains of lower abdominal pain for the last 6 days which is getting worse. Pain does not radiate. She rates it at 10/10. There is no associated nausea, vomiting. She denies dysuria. She denies constipation or diarrhea. She denies fever, chills, sweats. She had come to emergency 3 days ago and was diagnosed with UTI and given a prescription for cephalexin which has not given her any relief. Nothing makes pain better nothing makes it worse.   The history is provided by the patient and a relative.    Past Medical History:  Diagnosis Date  . Acid reflux   . Bladder disease   . Cardiomyopathy   . Diverticulosis     There are no active problems to display for this patient.   No past surgical history on file.  OB History    No data available       Home Medications    Prior to Admission medications   Medication Sig Start Date End Date Taking? Authorizing Provider  amLODipine (NORVASC) 5 MG tablet Take 5 mg by mouth daily.    Historical Provider, MD  cephALEXin (KEFLEX) 500 MG capsule Take 1 capsule (500 mg total) by mouth 3 (three) times daily. 06/15/16   Jerelyn ScottMartha Linker, MD  dorzolamide-timolol (COSOPT) 22.3-6.8 MG/ML ophthalmic solution Place 1 drop into both eyes 2 (two) times daily.    Historical Provider, MD  furosemide (LASIX) 20 MG tablet Take 20 mg by mouth daily.    Historical Provider, MD  HYDROcodone-acetaminophen (NORCO) 7.5-325 MG tablet Take 1 tablet by mouth every 6 (six) hours as needed for moderate pain. 07/13/15   Laurence Spatesachel Morgan Little, MD  HYDROcodone-acetaminophen (NORCO) 7.5-325 MG tablet Take 1 tablet by mouth every 6 (six) hours as needed for moderate pain. 09/09/15   Shawn C Joy, PA-C  loperamide (IMODIUM) 2 MG capsule  Take 1 capsule (2 mg total) by mouth 4 (four) times daily as needed for diarrhea or loose stools. 02/28/15   Elpidio AnisShari Upstill, PA-C  LORazepam (ATIVAN) 1 MG tablet Take 1 mg by mouth at bedtime.    Historical Provider, MD    Family History No family history on file.  Social History Social History  Substance Use Topics  . Smoking status: Never Smoker  . Smokeless tobacco: Never Used  . Alcohol use No     Allergies   Patient has no known allergies.   Review of Systems Review of Systems  All other systems reviewed and are negative.    Physical Exam Updated Vital Signs BP 159/60 (BP Location: Left Arm)   Pulse 68   Temp 97.6 F (36.4 C) (Oral)   Resp 16   Ht 5\' 3"  (1.6 m)   Wt 138 lb (62.6 kg)   SpO2 100%   BMI 24.45 kg/m   Physical Exam  Nursing note and vitals reviewed.  82 year old female, resting comfortably and in no acute distress. Vital signs are significant for hypertension. Oxygen saturation is 100%, which is normal. Head is normocephalic and atraumatic. PERRLA, EOMI. Oropharynx is clear. Neck is nontender and supple without adenopathy or JVD. Back is nontender and there is no CVA tenderness. Lungs are clear without rales, wheezes, or rhonchi. Chest is nontender. Heart  has regular rate and rhythm without murmur. Abdomen is soft, flat, with mild suprapubic tenderness. There is no rebound or guarding. There are no masses or hepatosplenomegaly and peristalsis is slightly hypoactive. Extremities have 2+ edema, full range of motion is present. Skin is warm and dry without rash. Neurologic: Mental status is normal, cranial nerves are intact, there are no motor or sensory deficits.  ED Treatments / Results  Labs (all labs ordered are listed, but only abnormal results are displayed) Labs Reviewed  COMPREHENSIVE METABOLIC PANEL - Abnormal; Notable for the following:       Result Value   Sodium 130 (*)    Chloride 96 (*)    Glucose, Bld 112 (*)    Creatinine, Ser  1.12 (*)    GFR calc non Af Amer 41 (*)    GFR calc Af Amer 48 (*)    All other components within normal limits  CBC WITH DIFFERENTIAL/PLATELET - Abnormal; Notable for the following:    RBC 3.69 (*)    Hemoglobin 11.2 (*)    HCT 32.8 (*)    All other components within normal limits  LIPASE, BLOOD  URINALYSIS, ROUTINE W REFLEX MICROSCOPIC (NOT AT [facility])    Radiology Ct Abdomen Pelvis W Contrast  Result Date: 06/18/2016 CLINICAL DATA:  82 y/o  F; burning generalized abdominal pain. EXAM: CT ABDOMEN AND PELVIS WITH CONTRAST TECHNIQUE: Multidetector CT imaging of the abdomen and pelvis was performed using the standard protocol following bolus administration of intravenous contrast. CONTRAST:  ISOVUE-300 IOPAMIDOL (ISOVUE-300) INJECTION 61% COMPARISON:  07/13/2015 CT abdomen and pelvis. FINDINGS: Lower chest: No acute abnormality. Hepatobiliary: No focal liver abnormality is seen. Collapsed gallbladder. No definite biliary ductal dilatation. Pancreas: Cystic lesion pancreatic tail 1.6 x 1.6 cm, previously 1.1 x 1.2 cm (series 2, image 16). Cystic lesion pancreatic head/uncinate 7.1 x 8.8 cm, previously 5.0 x 7.8 cm (series 2, image 30). Separate lesion anteriorly in head of pancreas measures up to 2.2 cm, previously 1.9 cm (series 2, image 20). Atrophy of the residual pancreas. Spleen: Normal in size without focal abnormality. Adrenals/Urinary Tract: Adrenal glands are stable with nodular thickening. Kidneys are normal, without renal calculi, focal lesion, or hydronephrosis. Bladder is unremarkable. Stomach/Bowel: No obstructive or inflammatory changes identified. Extensive diverticulosis of the descending colon and sigmoid colon. Vascular/Lymphatic: Aortic atherosclerosis. No enlarged abdominal or pelvic lymph nodes. Reproductive: Uterus and bilateral adnexa are unremarkable. Other: No abdominal wall hernia or abnormality. No abdominopelvic ascites. Musculoskeletal: No acute or significant osseous  findings. IMPRESSION: 1. No acute process identified. 2. Cystic masses within the pancreas demonstrate interval enlargement from prior CT. 3. Extensive descending colon and sigmoid diverticulosis without evidence for diverticulitis. 4. Severe aortic atherosclerosis. 5. Stable nodular thickening of the adrenal glands bilaterally. Electronically Signed   By: Mitzi Hansen M.D.   On: 06/18/2016 06:11    Procedures Procedures (including critical care time)  Medications Ordered in ED Medications  carbamazepine (TEGRETOL) tablet 200 mg (not administered)  morphine 4 MG/ML injection 4 mg (4 mg Intravenous Given 06/18/16 0547)  iopamidol (ISOVUE-300) 61 % injection 100 mL (100 mLs Intravenous Contrast Given 06/18/16 0525)     Initial Impression / Assessment and Plan / ED Course  I have reviewed the triage vital signs and the nursing notes.  Pertinent labs & imaging results that were available during my care of the patient were reviewed by me and considered in my medical decision making (see chart for details).  Clinical Course  Abdominal pain of uncertain cause. Old records are reviewed confirming ED visit 3 days ago at which time urinalysis showed many bacteria but no significant pyuria. Has had CT scans in the past which showed diverticulosis and I suspect she may have diverticulitis. She will be sent for CT of abdomen and pelvis.  CT shows abdominal mass which has increased in size from 1 year ago. Laboratory workup shows mild anemia and mild hyponatremia which are probably not clinically significant. Urinalysis is still pending. Patient now states that she has been having right facial pain for the last 3 days. She describes it as a shooting pain. Will treat for trigeminal neuralgia with carbamazepine. Case is signed out to Dr. Patria Maneampos.   Final Clinical Impressions(s) / ED Diagnoses   Final diagnoses:  Lower abdominal pain  Trigeminal neuralgia of right side of face  Renal  insufficiency  Normochromic normocytic anemia  Pancreatic mass    New Prescriptions New Prescriptions   No medications on file     Dione Boozeavid Haru Shaff, MD 06/18/16 [phone]

## 2016-06-18 NOTE — ED Notes (Signed)
Patient transported to CT 

## 2016-06-18 NOTE — ED Provider Notes (Signed)
Awaiting on urinalysis at this time.  Vital signs are stable.  CT scan without acute pathology.  Patient and family are aware of ongoing pancreatic mass and do not seek further workup at this time.  Likely disposition for home based on urinalysis.  Close primary care follow-up.  Overall the patient is well-appearing and nontoxic.   Azalia BilisKevin Logen Heintzelman, MD 06/18/16 [phone]

## 2016-06-18 NOTE — ED Triage Notes (Signed)
Pt c/o "burning" generalized abd pain since last tues. States she was seen Friday for same, but has not improved. Also reports R ear pain.

## 2016-06-19 LAB — URINE CULTURE: Culture: <10000 — AB

## 2016-06-20 ENCOUNTER — Emergency Department (HOSPITAL_COMMUNITY)
Admission: EM | Admit: 2016-06-20 | Discharge: 2016-06-20 | Disposition: A | Discharge status: Home / Self Care | Payer: Medicare Other | Attending: Emergency Medicine | Admitting: Emergency Medicine

## 2016-06-20 ENCOUNTER — Encounter (HOSPITAL_COMMUNITY): Payer: Self-pay | Admitting: *Deleted

## 2016-06-20 DIAGNOSIS — K8689 Other specified diseases of pancreas: Secondary | ICD-10-CM

## 2016-06-20 DIAGNOSIS — R109 Unspecified abdominal pain: Secondary | ICD-10-CM | POA: Diagnosis present

## 2016-06-20 DIAGNOSIS — E871 Hypo-osmolality and hyponatremia: Secondary | ICD-10-CM

## 2016-06-20 DIAGNOSIS — K869 Disease of pancreas, unspecified: Secondary | ICD-10-CM | POA: Diagnosis not present

## 2016-06-20 DIAGNOSIS — R269 Unspecified abnormalities of gait and mobility: Secondary | ICD-10-CM

## 2016-06-20 DIAGNOSIS — R52 Pain, unspecified: Secondary | ICD-10-CM

## 2016-06-20 LAB — CBC
HCT: 36.2 % (ref 36.0–46.0)
HEMOGLOBIN: 12.2 g/dL (ref 12.0–15.0)
MCH: 29.8 pg (ref 26.0–34.0)
MCHC: 33.7 g/dL (ref 30.0–36.0)
MCV: 88.5 fL (ref 78.0–100.0)
Platelets: 285 10*3/uL (ref 150–400)
RBC: 4.09 MIL/uL (ref 3.87–5.11)
RDW: 14.9 % (ref 11.5–15.5)
WBC: 10.6 10*3/uL — ABNORMAL HIGH (ref 4.0–10.5)

## 2016-06-20 LAB — COMPREHENSIVE METABOLIC PANEL
ALT: 16 U/L (ref 14–54)
ANION GAP: 9 (ref 5–15)
AST: 28 U/L (ref 15–41)
Albumin: 4 g/dL (ref 3.5–5.0)
Alkaline Phosphatase: 57 U/L (ref 38–126)
BUN: 22 mg/dL — ABNORMAL HIGH (ref 6–20)
CHLORIDE: 99 mmol/L — AB (ref 101–111)
CO2: 23 mmol/L (ref 22–32)
Calcium: 9.5 mg/dL (ref 8.9–10.3)
Creatinine, Ser: 1.53 mg/dL — ABNORMAL HIGH (ref 0.44–1.00)
GFR calc non Af Amer: 28 mL/min — ABNORMAL LOW (ref >60)
GFR, EST AFRICAN AMERICAN: 33 mL/min — AB (ref >60)
Glucose, Bld: 142 mg/dL — ABNORMAL HIGH (ref 65–99)
POTASSIUM: 4.8 mmol/L (ref 3.5–5.1)
SODIUM: 131 mmol/L — AB (ref 135–145)
Total Bilirubin: 0.5 mg/dL (ref 0.3–1.2)
Total Protein: 6.6 g/dL (ref 6.5–8.1)

## 2016-06-20 LAB — MAGNESIUM: MAGNESIUM: 2.2 mg/dL (ref 1.7–2.4)

## 2016-06-20 LAB — PHOSPHORUS: Phosphorus: 3.9 mg/dL (ref 2.5–4.6)

## 2016-06-20 LAB — LIPASE, BLOOD: LIPASE: 28 U/L (ref 11–51)

## 2016-06-20 MED ORDER — LORAZEPAM 1 MG PO TABS: 1.0000 mg | ORAL_TABLET | Freq: Every evening | ORAL | 0 refills | Status: DC | PRN

## 2016-06-20 MED ORDER — DOCUSATE SODIUM 100 MG PO CAPS: 100.0000 mg | ORAL_CAPSULE | Freq: Two times a day (BID) | ORAL | 0 refills | Status: DC

## 2016-06-20 NOTE — ED Notes (Signed)
Case manager came by. Stated: " do not DC until Home Health came and see patient face to face", also order a walker before she leaves"

## 2016-06-20 NOTE — ED Triage Notes (Signed)
Pt reports not feeling well. Wakes up with burnings pain to entire body, including abd. Was treated for possible UTI.

## 2016-06-20 NOTE — ED Notes (Signed)
Patient bladder scan 200

## 2016-06-20 NOTE — ED Notes (Signed)
Still waiting for a walker

## 2016-06-20 NOTE — ED Notes (Signed)
Waiting for a walker

## 2016-06-20 NOTE — ED Notes (Signed)
Swelling noted in the LE

## 2016-06-20 NOTE — ED Notes (Signed)
[name] stated"We waited for hours". We do not want to wait for walker any longer. If Home Health will come to us, let them bring it to our home. Pt and family left

## 2016-06-20 NOTE — ED Provider Notes (Signed)
[facility] Provider Note   CSN: [id number] Arrival date & time: 06/20/16  [id number]     History   Chief Complaint Chief Complaint  Patient presents with  . Abdominal Pain    HPI [name] is a 82 y.o. female.  HPI Pain that has a burning and hot quality that radiates up and down the patient's body. She has a hard time localizing it. Family members seem to think it does start in her abdomen or trunk area. Patient describes having this occur when she gets up. It does seem to abate sometimes when she is sitting up. Members report that last night she was up and down all night and sitting in her recliner. This has been ongoing and intermittent problem. No vomiting or diarrhea. She had been treated for UTI. She continues to complain of burning but she describes this as encompassing most of her body.  The patient does take Ativan every night to sleep. She has run out of Ativan. She reports she can't get it refilled as she sees her doctor again. She does not recall when that next prescription is due. Patient's son-in-law does report she seems to have a low pain tolerance and takes more of her Vicodin then she is prescribed. Sometimes she'll take 8 pills within 2 days and in the prescription starts running out and she has to cut them in half.  Notably, the patient does have a pancreatic mass. The etiology of this is uncertain. It has been present and has grown slightly. This is not a new finding.  Family members are concerned because the patient is less steady with her gait than she used to be. She has not fallen but they report she has been close to falling at times. The patient lives alone independently. Past Medical History:  Diagnosis Date  . Acid reflux   . Bladder disease   . Cardiomyopathy   . Diverticulosis     There are no active problems to display for this patient.   History reviewed. No pertinent surgical history.  OB History    No data available        Home Medications    Prior to Admission medications   Medication Sig Start Date End Date Taking? Authorizing Provider  amLODipine (NORVASC) 5 MG tablet Take 5 mg by mouth daily.   Yes Historical Provider, MD  CIPRODEX otic suspension Take 4 drops by mouth 2 (two) times daily. For seven days 06/19/16  Yes Historical Provider, MD  ciprofloxacin (CIPRO) 500 MG tablet Take 1 tablet (500 mg total) by mouth 2 (two) times daily. 06/18/16  Yes Azalia BilisKevin Campos, MD  dorzolamide-timolol (COSOPT) 22.3-6.8 MG/ML ophthalmic solution Place 1 drop into both eyes 2 (two) times daily.   Yes Historical Provider, MD  furosemide (LASIX) 20 MG tablet Take 20 mg by mouth daily.   Yes Historical Provider, MD  HYDROcodone-acetaminophen (NORCO) 7.5-325 MG tablet Take 1 tablet by mouth every 6 (six) hours as needed for moderate pain. 07/13/15  Yes Ambrose Finlandachel Morgan Little, MD  LORazepam (ATIVAN) 1 MG tablet Take 1 mg by mouth at bedtime.   Yes Historical Provider, MD  predniSONE (DELTASONE) 10 MG tablet Take 10-60 mg by mouth as directed. Taper daily by one 06/19/16  Yes Historical Provider, MD  cephALEXin (KEFLEX) 500 MG capsule Take 1 capsule (500 mg total) by mouth 3 (three) times daily. Patient not taking: Reported on 06/20/2016 06/15/16   Jerelyn ScottMartha Linker, MD  docusate sodium (COLACE) 100 MG capsule Take  1 capsule (100 mg total) by mouth every 12 (twelve) hours. 06/20/16   Arby BarretteMarcy Phong Isenberg, MD  HYDROcodone-acetaminophen (NORCO) 7.5-325 MG tablet Take 1 tablet by mouth every 6 (six) hours as needed for moderate pain. Patient not taking: Reported on 06/20/2016 09/09/15   Hillard DankerShawn C Joy, PA-C  loperamide (IMODIUM) 2 MG capsule Take 1 capsule (2 mg total) by mouth 4 (four) times daily as needed for diarrhea or loose stools. Patient not taking: Reported on 06/20/2016 02/28/15   Elpidio AnisShari Upstill, PA-C  LORazepam (ATIVAN) 1 MG tablet Take 1 tablet (1 mg total) by mouth at bedtime as needed for anxiety. 06/20/16   Arby BarretteMarcy Melyna Huron, MD     Family History History reviewed. No pertinent family history.  Social History Social History  Substance Use Topics  . Smoking status: Never Smoker  . Smokeless tobacco: Never Used  . Alcohol use No     Allergies   Patient has no known allergies.   Review of Systems Review of Systems 10 Systems reviewed and are negative for acute change except as noted in the HPI.   Physical Exam Updated Vital Signs BP 132/65   Pulse 74   Temp 97.7 F (36.5 C) (Oral)   Resp 22   SpO2 99%   Physical Exam  Constitutional: She is oriented to person, place, and time. She appears well-developed and well-nourished. No distress.  HENT:  Head: Normocephalic and atraumatic.  Mouth/Throat: Oropharynx is clear and moist.  Eyes: Conjunctivae and EOM are normal.  Neck: Neck supple.  Cardiovascular: Normal rate and regular rhythm.   No murmur heard. Pulmonary/Chest: Effort normal and breath sounds normal. No respiratory distress.  Abdominal: Soft. She exhibits no distension. There is no tenderness. There is no guarding.  Musculoskeletal: She exhibits no edema, tenderness or deformity.  Neurological: She is alert and oriented to person, place, and time. No cranial nerve deficit. She exhibits normal muscle tone. Coordination normal.  Skin: Skin is warm and dry.  Psychiatric: She has a normal mood and affect.  Nursing note and vitals reviewed.    ED Treatments / Results  Labs (all labs ordered are listed, but only abnormal results are displayed) Labs Reviewed  COMPREHENSIVE METABOLIC PANEL - Abnormal; Notable for the following:       Result Value   Sodium 131 (*)    Chloride 99 (*)    Glucose, Bld 142 (*)    BUN 22 (*)    Creatinine, Ser 1.53 (*)    GFR calc non Af Amer 28 (*)    GFR calc Af Amer 33 (*)    All other components within normal limits  CBC - Abnormal; Notable for the following:    WBC 10.6 (*)    All other components within normal limits  LIPASE, BLOOD  MAGNESIUM   PHOSPHORUS  URINALYSIS, ROUTINE W REFLEX MICROSCOPIC (NOT AT [facility])    EKG  EKG Interpretation None       Radiology No results found.  Procedures Procedures (including critical care time)  Medications Ordered in ED Medications - No data to display   Initial Impression / Assessment and Plan / ED Course  I have reviewed the triage vital signs and the nursing notes.  Pertinent labs & imaging results that were available during my care of the patient were reviewed by me and considered in my medical decision making (see chart for details).  Clinical Course   Consult: Case management for in-home assistance.  Final Clinical Impressions(s) / ED Diagnoses  Final diagnoses:  Gait difficulty  Generalized pain  Hyponatremia  Pancreatic mass   Clinically the patient has well appearance. She has several underlying conditions such as the pancreatic mass which is not clearly malignant. This has increased somewhat in size since last year. It's unclear if this is actually a source of pain. The patient's pain is very difficult to localize and has some type of a burning or paresthesia quality that seems to encompass much of her body. Patient does take pain pills at home. Her son-in-law thinks that she takes them for minor pains or prevent pain. This may be reasonable however they need to address this carefully with the patient's family physician. Also have ordered home health for some in-home assessment of the patient's function and medication management as well as gait stability. Patient does have hyponatremia although I doubt this is the etiology of her symptoms. She is not otherwise symptomatic. Patient and family members have been counseled on free water restriction and slight increase in dietary sodium with close follow-up with PCP. Patient has run out of her Ativan. At this point, I think that the risk of withdrawal is outweighed by benefit of withholding prescription. Patient will be given  Ativan to take when necessary at nighttime. New Prescriptions New Prescriptions   DOCUSATE SODIUM (COLACE) 100 MG CAPSULE    Take 1 capsule (100 mg total) by mouth every 12 (twelve) hours.   LORAZEPAM (ATIVAN) 1 MG TABLET    Take 1 tablet (1 mg total) by mouth at bedtime as needed for anxiety.     Arby Barrette, MD 06/20/16 1335

## 2016-06-20 NOTE — ED Notes (Signed)
Called lab added Phosphate and Mag to blood already sent.

## 2016-06-20 NOTE — Care Management Note (Signed)
Case Management Note  Patient Details  Name: [name] MRN: [id number] Date of Birth: Aug 31, 1919  Subjective/Objective:                   82 y.o. female in ER with pain that has a burning and hot quality that radiates up and down the patient's body. / From home alone.  Action/Plan: Follow for disposition needs. /Home with home health vs SNF.  If home with home health, DME rolling walker.   Expected Discharge Date:      06/20/2016            Expected Discharge Plan:  Home w Home Health Services  In-House Referral:  Clinical Social Work  Discharge planning Services  CM Consult  Post Acute Care Choice:  Durable Medical Equipment Choice offered to:  Patient  DME Arranged:  Walker rolling DME Agency:  [facility]  [facility]  [facility]  Status of Service:  In process, will continue to follow  If discussed at Long Length of Stay Meetings, dates discussed:    Additional Comments: Davan Nawabi J. Lucretia RoersWood, RN, BSN, [employer] [phone] Spoke with pt at bedside regarding discharge planning [facility]. Offered pt list of home health agencies to choose from.  [facility]. [facility].  DME needs identified at this time include rolling walker.  [facility] will deliver to pt room prior to discharge home today.    Oletta CohnWood, Burdette Gergely, RN 06/20/2016, 1:49 PM

## 2016-06-20 NOTE — ED Notes (Signed)
MD at bedside. 

## 2017-01-29 ENCOUNTER — Ambulatory Visit (HOSPITAL_COMMUNITY)
Admission: EM | Admit: 2017-01-29 | Discharge: 2017-01-29 | Disposition: A | Discharge status: Home / Self Care | Payer: Medicare Other | Attending: Family Medicine | Admitting: Family Medicine

## 2017-01-29 ENCOUNTER — Encounter (HOSPITAL_COMMUNITY): Payer: Self-pay | Admitting: Emergency Medicine

## 2017-01-29 DIAGNOSIS — R300 Dysuria: Secondary | ICD-10-CM | POA: Diagnosis not present

## 2017-01-29 DIAGNOSIS — N390 Urinary tract infection, site not specified: Secondary | ICD-10-CM

## 2017-01-29 LAB — POCT URINALYSIS DIP (DEVICE)
Glucose, UA: NEGATIVE mg/dL
Hgb urine dipstick: NEGATIVE
KETONES UR: NEGATIVE mg/dL
NITRITE: NEGATIVE
Protein, ur: 100 mg/dL — AB
Specific Gravity, Urine: 1.02 (ref 1.005–1.030)
Urobilinogen, UA: 1 mg/dL (ref 0.0–1.0)
pH: 6.5 (ref 5.0–8.0)

## 2017-01-29 MED ORDER — PHENAZOPYRIDINE HCL 200 MG PO TABS: 200.0000 mg | ORAL_TABLET | Freq: Three times a day (TID) | ORAL | 0 refills | Status: DC | PRN

## 2017-01-29 MED ORDER — CEPHALEXIN 500 MG PO CAPS: 500.0000 mg | ORAL_CAPSULE | Freq: Two times a day (BID) | ORAL | 0 refills | Status: DC

## 2017-01-29 NOTE — ED Triage Notes (Signed)
[facility] a [facility] lower abdominal pain and dysuria x 1 week.

## 2017-01-29 NOTE — ED Notes (Signed)
Patient in bathroom attempting to obtain a urine specimen.  Family member with patient.

## 2017-01-29 NOTE — ED Provider Notes (Signed)
[facility]    CSN: [id number] Arrival date & time: 01/29/17  1034     History   Chief Complaint Chief Complaint  Patient presents with  . Abdominal Pain  . Dysuria   HPI [name] is a 83 y.o. female presenting with urinary frequency, urgency, and burning  She complains of dysuria, nocturia, urinary frequency, urinary urgency that are increasing in frequency over the past week. She also notes mild lower abdominal tenderness. No hematuria. Symptoms are consistent with prior UTIs. No medications tried. No nausea, vomiting, diarrhea, suspected dehydration, flank pain, vaginal bleeding or discharge, or fever.   Past Medical History:  Diagnosis Date  . Acid reflux   . Bladder disease   . Cardiomyopathy   . Diverticulosis     There are no active problems to display for this patient.   History reviewed. No pertinent surgical history.  OB History    No data available       Home Medications    Prior to Admission medications   Medication Sig Start Date End Date Taking? Authorizing Provider  amLODipine (NORVASC) 5 MG tablet Take 5 mg by mouth daily.    [provider]  cephALEXin (KEFLEX) 500 MG capsule Take 1 capsule (500 mg total) by mouth 2 (two) times daily. 01/29/17   Tyrone NineGrunz, Harman Langhans B, MD  docusate sodium (COLACE) 100 MG capsule Take 1 capsule (100 mg total) by mouth every 12 (twelve) hours. 06/20/16   Arby BarrettePfeiffer, Marcy, MD  dorzolamide-timolol (COSOPT) 22.3-6.8 MG/ML ophthalmic solution Place 1 drop into both eyes 2 (two) times daily.    [provider]  furosemide (LASIX) 20 MG tablet Take 20 mg by mouth daily.    [provider]  HYDROcodone-acetaminophen (NORCO) 7.5-325 MG tablet Take 1 tablet by mouth every 6 (six) hours as needed for moderate pain. 07/13/15   Little, Ambrose Finlandachel Morgan, MD  LORazepam (ATIVAN) 1 MG tablet Take 1 mg by mouth at bedtime.    [provider]  LORazepam (ATIVAN) 1 MG tablet Take 1 tablet (1 mg  total) by mouth at bedtime as needed for anxiety. 06/20/16   Arby BarrettePfeiffer, Marcy, MD  phenazopyridine (PYRIDIUM) 200 MG tablet Take 1 tablet (200 mg total) by mouth 3 (three) times daily as needed for pain. 01/29/17   Tyrone NineGrunz, Marquelle Musgrave B, MD    Family History History reviewed. No pertinent family history.  Social History Social History  Substance Use Topics  . Smoking status: Never Smoker  . Smokeless tobacco: Never Used  . Alcohol use No     Allergies   Patient has no known allergies.   Review of Systems Review of Systems As above  Physical Exam Triage Vital Signs ED Triage Vitals  Enc Vitals Group     BP 01/29/17 1108 (!) 178/64     Pulse Rate 01/29/17 1108 75     Resp 01/29/17 1108 16     Temp 01/29/17 1108 98.2 F (36.8 C)     Temp Source 01/29/17 1108 Oral     SpO2 01/29/17 1108 100 %     Weight --      Height --      Head Circumference --      Peak Flow --      Pain Score 01/29/17 1107 8     Pain Loc --      Pain Edu? --      Excl. in GC? --    No data found.   Updated Vital Signs  BP (!) 178/64 (BP Location: Right Arm)   Pulse 75   Temp 98.2 F (36.8 C) (Oral)   Resp 16   SpO2 100%   Visual Acuity Right Eye Distance:   Left Eye Distance:   Bilateral Distance:    Right Eye Near:   Left Eye Near:    Bilateral Near:     Physical Exam Gen: Elderly female in no distress Abd: Soft, NTND, BS present, Mild suprapubic tenderness. No CVA tenderness. Skin: No rashes noted MSK: No signs of joint effusions. No edema  UC Treatments / Results  Labs (all labs ordered are listed, but only abnormal results are displayed) Labs Reviewed  POCT URINALYSIS DIP (DEVICE) - Abnormal; Notable for the following:       Result Value   Bilirubin Urine SMALL (*)    Protein, ur 100 (*)    Leukocytes, UA TRACE (*)    All other components within normal limits  URINE CULTURE    EKG  EKG Interpretation None       Radiology No results found.  Procedures Procedures  (including critical care time)  Medications Ordered in UC Medications - No data to display   Initial Impression / Assessment and Plan / UC Course  I have reviewed the triage vital signs and the nursing notes.  Pertinent labs & imaging results that were available during my care of the patient were reviewed by me and considered in my medical decision making (see chart for details).  Final Clinical Impressions(s) / UC Diagnoses   Final diagnoses:  Lower urinary tract infectious disease   83yo F with urinary symptoms and pyuria consistent with recurrent UTI. Based on prior culture results, prior CrCl (between 30-45), and no allergies, will prescribe keflex 500mg  BID and pyridium. Urine sent to culture. Return precautions advised.   New Prescriptions New Prescriptions   CEPHALEXIN (KEFLEX) 500 MG CAPSULE    Take 1 capsule (500 mg total) by mouth 2 (two) times daily.   PHENAZOPYRIDINE (PYRIDIUM) 200 MG TABLET    Take 1 tablet (200 mg total) by mouth 3 (three) times daily as needed for pain.     Tyrone Nine, MD 01/29/17 [phone]

## 2017-11-04 ENCOUNTER — Encounter (HOSPITAL_COMMUNITY): Payer: Self-pay

## 2017-11-04 ENCOUNTER — Emergency Department (HOSPITAL_COMMUNITY): Payer: Medicare Other

## 2017-11-04 ENCOUNTER — Emergency Department (HOSPITAL_COMMUNITY)
Admission: EM | Admit: 2017-11-04 | Discharge: 2017-11-04 | Disposition: A | Discharge status: Home / Self Care | Payer: Medicare Other | Attending: Emergency Medicine | Admitting: Emergency Medicine

## 2017-11-04 DIAGNOSIS — R109 Unspecified abdominal pain: Secondary | ICD-10-CM | POA: Diagnosis present

## 2017-11-04 DIAGNOSIS — K5900 Constipation, unspecified: Secondary | ICD-10-CM | POA: Insufficient documentation

## 2017-11-04 DIAGNOSIS — R1030 Lower abdominal pain, unspecified: Secondary | ICD-10-CM | POA: Insufficient documentation

## 2017-11-04 DIAGNOSIS — Z79899 Other long term (current) drug therapy: Secondary | ICD-10-CM | POA: Diagnosis not present

## 2017-11-04 DIAGNOSIS — N390 Urinary tract infection, site not specified: Secondary | ICD-10-CM | POA: Diagnosis not present

## 2017-11-04 HISTORY — DX: Constipation, unspecified: K59.00

## 2017-11-04 HISTORY — DX: Disorder of kidney and ureter, unspecified: N28.9

## 2017-11-04 HISTORY — DX: Interstitial cystitis (chronic) without hematuria: N30.10

## 2017-11-04 LAB — URINALYSIS, ROUTINE W REFLEX MICROSCOPIC
Bilirubin Urine: NEGATIVE
Glucose, UA: NEGATIVE mg/dL
HGB URINE DIPSTICK: NEGATIVE
Ketones, ur: NEGATIVE mg/dL
Nitrite: NEGATIVE
Protein, ur: NEGATIVE mg/dL
SPECIFIC GRAVITY, URINE: 1.013 (ref 1.005–1.030)
pH: 5 (ref 5.0–8.0)

## 2017-11-04 LAB — COMPREHENSIVE METABOLIC PANEL
ALK PHOS: 58 U/L (ref 38–126)
ALT: 13 U/L — AB (ref 14–54)
AST: 21 U/L (ref 15–41)
Albumin: 4.2 g/dL (ref 3.5–5.0)
Anion gap: 9 (ref 5–15)
BILIRUBIN TOTAL: 0.6 mg/dL (ref 0.3–1.2)
BUN: 29 mg/dL — ABNORMAL HIGH (ref 6–20)
CALCIUM: 9.5 mg/dL (ref 8.9–10.3)
CO2: 25 mmol/L (ref 22–32)
CREATININE: 1.5 mg/dL — AB (ref 0.44–1.00)
Chloride: 102 mmol/L (ref 101–111)
GFR, EST AFRICAN AMERICAN: 33 mL/min — AB (ref >60)
GFR, EST NON AFRICAN AMERICAN: 28 mL/min — AB (ref >60)
Glucose, Bld: 140 mg/dL — ABNORMAL HIGH (ref 65–99)
Potassium: 4.6 mmol/L (ref 3.5–5.1)
Sodium: 136 mmol/L (ref 135–145)
Total Protein: 7.5 g/dL (ref 6.5–8.1)

## 2017-11-04 LAB — CBC
HCT: 40.1 % (ref 36.0–46.0)
Hemoglobin: 12.9 g/dL (ref 12.0–15.0)
MCH: 29.7 pg (ref 26.0–34.0)
MCHC: 32.2 g/dL (ref 30.0–36.0)
MCV: 92.4 fL (ref 78.0–100.0)
PLATELETS: 280 10*3/uL (ref 150–400)
RBC: 4.34 MIL/uL (ref 3.87–5.11)
RDW: 15.2 % (ref 11.5–15.5)
WBC: 5.8 10*3/uL (ref 4.0–10.5)

## 2017-11-04 LAB — LIPASE, BLOOD: Lipase: 22 U/L (ref 11–51)

## 2017-11-04 MED ORDER — CEPHALEXIN 250 MG PO CAPS
250.0000 mg | ORAL_CAPSULE | Freq: Once | ORAL | Status: AC
  Administered 2017-11-04: 250 mg via ORAL
  Filled 2017-11-04: qty 1

## 2017-11-04 MED ORDER — IOPAMIDOL (ISOVUE-300) INJECTION 61%
30.0000 mL | Freq: Once | INTRAVENOUS | Status: AC | PRN
  Administered 2017-11-04: 30 mL via ORAL

## 2017-11-04 MED ORDER — SODIUM CHLORIDE 0.9 % IV BOLUS
500.0000 mL | Freq: Once | INTRAVENOUS | Status: AC
Start: 2017-11-04 — End: 2017-11-04
  Administered 2017-11-04: 500 mL via INTRAVENOUS

## 2017-11-04 MED ORDER — CEPHALEXIN 250 MG PO CAPS: 250.0000 mg | ORAL_CAPSULE | Freq: Two times a day (BID) | ORAL | 0 refills | Status: AC

## 2017-11-04 MED ORDER — IOPAMIDOL (ISOVUE-300) INJECTION 61%
INTRAVENOUS | Status: AC
  Filled 2017-11-04: qty 30

## 2017-11-04 MED ORDER — POLYETHYLENE GLYCOL 3350 17 G PO PACK: 17.0000 g | PACK | Freq: Every day | ORAL | 0 refills | Status: DC

## 2017-11-04 NOTE — ED Notes (Signed)
Patient states she used the restroom in the lobby before coming back and did not know she was supposed to provide a specimen.

## 2017-11-04 NOTE — ED Provider Notes (Signed)
[facility] Provider Note   CSN: [id number] Arrival date & time: 11/04/17  1302     History   Chief Complaint Chief Complaint  Patient presents with  . Abdominal Pain  . Constipation    HPI [name] is a 84 y.o. female.  HPI  84 year old female with a history of interstitial cystitis who is on chronic hydrocodone and lorazepam presents with abdominal pain.  History is taken from the family as the patient is a poor historian.  She has been having on and off "excruciating" abdominal pain for for 5 days.  Last bowel movement was about 3 days ago.  She has had on and off issues with constipation but until 3 days ago the patient states she was regular.  She has not had a fever or vomiting.  She does have chronic lower abdominal pain from interstitial cystitis but this seems more severe.  She is not sure if she is having difficulty urinating.  Denies back pain.  She has had a history of diverticulosis.  Pain is diffusely lower abdomen.  At this point she describes is more moderate or mild.  Past Medical History:  Diagnosis Date  . Acid reflux   . Bladder disease   . Cardiomyopathy   . Constipation   . Diverticulosis   . Interstitial cystitis   . Renal disorder     There are no active problems to display for this patient.   Past Surgical History:  Procedure Laterality Date  . HERNIA REPAIR       OB History   None      Home Medications    Prior to Admission medications   Medication Sig Start Date End Date Taking? Authorizing Provider  amLODipine (NORVASC) 5 MG tablet Take 5 mg by mouth daily.   Yes [provider]  busPIRone (BUSPAR) 5 MG tablet Take 5 mg by mouth 2 (two) times daily. 10/01/17  Yes [provider]  dorzolamide-timolol (COSOPT) 22.3-6.8 MG/ML ophthalmic solution Place 1 drop into both eyes 2 (two) times daily.   Yes [provider]  furosemide (LASIX) 20 MG tablet Take 20 mg by mouth  daily.   Yes [provider]  HYDROcodone-acetaminophen (NORCO) 7.5-325 MG tablet Take 1 tablet by mouth every 6 (six) hours as needed for moderate pain. 07/13/15  Yes Little, Ambrose Finland, MD  LORazepam (ATIVAN) 1 MG tablet Take 1 tablet (1 mg total) by mouth at bedtime as needed for anxiety. 06/20/16  Yes Arby Barrette, MD  cephALEXin (KEFLEX) 250 MG capsule Take 1 capsule (250 mg total) by mouth 2 (two) times daily for 5 days. 11/04/17 11/09/17  Pricilla Loveless, MD  docusate sodium (COLACE) 100 MG capsule Take 1 capsule (100 mg total) by mouth every 12 (twelve) hours. Patient not taking: Reported on 11/04/2017 06/20/16   Arby Barrette, MD  phenazopyridine (PYRIDIUM) 200 MG tablet Take 1 tablet (200 mg total) by mouth 3 (three) times daily as needed for pain. Patient not taking: Reported on 11/04/2017 01/29/17   Tyrone Nine, MD  polyethylene glycol [facility]) packet Take 17 g by mouth daily. 11/04/17   Pricilla Loveless, MD    Family History History reviewed. No pertinent family history.  Social History Social History   Tobacco Use  . Smoking status: Never Smoker  . Smokeless tobacco: Never Used  Substance Use Topics  . Alcohol use: No  . Drug use: No     Allergies   Patient has  no known allergies.   Review of Systems Review of Systems  Constitutional: Negative for fever.  Gastrointestinal: Positive for abdominal distention and abdominal pain. Negative for vomiting.  All other systems reviewed and are negative.    Physical Exam Updated Vital Signs BP 128/60   Pulse 70   Temp 97.9 F (36.6 C) (Oral)   Resp 17   Ht 5\' 3"  (1.6 m)   Wt 62.6 kg (138 lb)   SpO2 99%   BMI 24.45 kg/m   Physical Exam  Constitutional: She is oriented to person, place, and time. She appears well-developed and well-nourished.  Non-toxic appearance. She does not appear ill. No distress.  HENT:  Head: Normocephalic and atraumatic.  Right Ear: External ear normal.  Left  Ear: External ear normal.  Nose: Nose normal.  Eyes: Right eye exhibits no discharge. Left eye exhibits no discharge.  Cardiovascular: Normal rate, regular rhythm and normal heart sounds.  Pulmonary/Chest: Effort normal and breath sounds normal.  Abdominal: Soft. She exhibits no distension. There is tenderness (LLQ>RLQ) in the right lower quadrant, suprapubic area and left lower quadrant.  Neurological: She is alert and oriented to person, place, and time.  Skin: Skin is warm and dry.  Nursing note and vitals reviewed.    ED Treatments / Results  Labs (all labs ordered are listed, but only abnormal results are displayed) Labs Reviewed  COMPREHENSIVE METABOLIC PANEL - Abnormal; Notable for the following components:      Result Value   Glucose, Bld 140 (*)    BUN 29 (*)    Creatinine, Ser 1.50 (*)    ALT 13 (*)    GFR calc non Af Amer 28 (*)    GFR calc Af Amer 33 (*)    All other components within normal limits  URINALYSIS, ROUTINE W REFLEX MICROSCOPIC - Abnormal; Notable for the following components:   APPearance HAZY (*)    Leukocytes, UA LARGE (*)    Bacteria, UA RARE (*)    Squamous Epithelial / LPF 6-30 (*)    Non Squamous Epithelial 0-5 (*)    All other components within normal limits  URINE CULTURE  LIPASE, BLOOD  CBC    EKG None  Radiology Ct Abdomen Pelvis Wo Contrast  Result Date: 11/04/2017 CLINICAL DATA:  Acute onset of generalized abdominal pain. EXAM: CT ABDOMEN AND PELVIS WITHOUT CONTRAST TECHNIQUE: Multidetector CT imaging of the abdomen and pelvis was performed following the standard protocol without IV contrast. COMPARISON:  CT of the abdomen and pelvis from 06/18/2016 FINDINGS: Lower chest: Calcification is seen along the descending thoracic aorta. The visualized lung bases are grossly clear. There is slight eventration of the right hemidiaphragm. Hepatobiliary: The liver is unremarkable in appearance. The gallbladder is unremarkable in appearance. The  common bile duct remains normal in caliber. Pancreas: The cystic lesions in the pancreas have increased somewhat in size, measuring approximately 9.3 x 5.8 cm at the pancreatic head, and 2.1 cm at the pancreatic tail. The smaller cystic lesion adjacent to the head of the pancreas measures 2.4 cm. The remainder of the pancreas is grossly unremarkable. Spleen: The spleen is unremarkable in appearance. Adrenals/Urinary Tract: The adrenal glands are grossly unremarkable in appearance. Minimal bilateral renal atrophy is noted. There is no evidence of hydronephrosis. Bilateral renal parapelvic cysts are noted. No renal or ureteral stones are identified. No perinephric stranding is seen. Stomach/Bowel: A tiny hiatal hernia is noted. The stomach is otherwise unremarkable. The small bowel is within normal limits. The  appendix is normal in caliber, without evidence of appendicitis. Scattered diverticulosis is noted along the descending and sigmoid colon, without evidence of diverticulitis. Vascular/Lymphatic: Diffuse calcification is seen along the abdominal aorta and its branches. The abdominal aorta is otherwise grossly unremarkable. The inferior vena cava is grossly unremarkable. No retroperitoneal lymphadenopathy is seen. No pelvic sidewall lymphadenopathy is identified. Reproductive: The bladder is mildly distended and grossly unremarkable. The uterus is unremarkable in appearance. The ovaries are grossly symmetric. No suspicious adnexal masses are seen. Other: Large calcifications at the right hemipelvis are grossly stable in appearance. Musculoskeletal: No acute osseous abnormalities are identified. Mild facet disease is noted at the lower lumbar spine. The visualized musculature is unremarkable in appearance. IMPRESSION: 1. Scattered diverticulosis along the descending and sigmoid colon, without evidence of diverticulitis. 2. Cystic lesions within the pancreas have increased somewhat in size, the largest of which  measures up to 9.3 cm. 3. Minimal bilateral renal atrophy. Bilateral renal parapelvic cysts noted. 4. Tiny hiatal hernia noted. Aortic Atherosclerosis (ICD10-I70.0). Electronically Signed   By: Roanna Raider M.D.   On: 11/04/2017 21:19    Procedures Procedures (including critical care time)  Medications Ordered in ED Medications  sodium chloride 0.9 % bolus 500 mL (0 mLs Intravenous Stopped 11/04/17 2245)  iopamidol (ISOVUE-300) 61 % injection 30 mL (30 mLs Oral Contrast Given 11/04/17 2046)  cephALEXin (KEFLEX) capsule 250 mg (250 mg Oral Given 11/04/17 2239)     Initial Impression / Assessment and Plan / ED Course  I have reviewed the triage vital signs and the nursing notes.  Pertinent labs & imaging results that were available during my care of the patient were reviewed by me and considered in my medical decision making (see chart for details).     Patient's abdominal pain is probably coming from an acute urinary tract infection.  Her CT scan does not show any acute pathology such as diverticulitis.  She does have an enlarging pancreatic cyst but family is well aware of this.  At this point, she has no current complaints of abdominal pain.  I discussed using MiraLAX for her acute on chronic constipation.  She will be treated with Keflex that will be dosed to her creatinine clearance.  Otherwise she appears stable for outpatient management with follow-up with PCP given benign vital signs and no fever or vomiting.  Discussed return precautions.  Final Clinical Impressions(s) / ED Diagnoses   Final diagnoses:  Acute UTI  Lower abdominal pain    ED Discharge Orders        Ordered    cephALEXin (KEFLEX) 250 MG capsule  2 times daily     11/04/17 2244    polyethylene glycol (MIRALAX / GLYCOLAX) packet  Daily     11/04/17 2244       Pricilla Loveless, MD 11/05/17 [phone]

## 2017-11-04 NOTE — ED Notes (Signed)
2 unsuccessful attempts at IV. Another RN to attempt.

## 2017-11-04 NOTE — ED Triage Notes (Signed)
Patient c/o bilateral lowe abdominal pain x 2 weeks.  Patient has a history of Interstitial cystitis and constipation.

## 2017-11-04 NOTE — ED Notes (Signed)
Patient transported to CT 

## 2017-11-06 LAB — URINE CULTURE

## 2017-11-07 ENCOUNTER — Encounter (HOSPITAL_COMMUNITY): Payer: Self-pay | Admitting: Emergency Medicine

## 2017-11-07 ENCOUNTER — Emergency Department (HOSPITAL_COMMUNITY): Payer: Medicare Other

## 2017-11-07 ENCOUNTER — Emergency Department (HOSPITAL_COMMUNITY)
Admission: EM | Admit: 2017-11-07 | Discharge: 2017-11-07 | Disposition: A | Discharge status: Home / Self Care | Payer: Medicare Other | Attending: Emergency Medicine | Admitting: Emergency Medicine

## 2017-11-07 DIAGNOSIS — R1084 Generalized abdominal pain: Secondary | ICD-10-CM | POA: Diagnosis present

## 2017-11-07 DIAGNOSIS — K862 Cyst of pancreas: Secondary | ICD-10-CM | POA: Diagnosis not present

## 2017-11-07 DIAGNOSIS — R112 Nausea with vomiting, unspecified: Secondary | ICD-10-CM | POA: Diagnosis not present

## 2017-11-07 DIAGNOSIS — Z79899 Other long term (current) drug therapy: Secondary | ICD-10-CM | POA: Diagnosis not present

## 2017-11-07 LAB — URINALYSIS, ROUTINE W REFLEX MICROSCOPIC
Bilirubin Urine: NEGATIVE
Glucose, UA: NEGATIVE mg/dL
Hgb urine dipstick: NEGATIVE
KETONES UR: NEGATIVE mg/dL
LEUKOCYTES UA: NEGATIVE
Nitrite: NEGATIVE
PH: 6 (ref 5.0–8.0)
PROTEIN: NEGATIVE mg/dL
Specific Gravity, Urine: 1.014 (ref 1.005–1.030)

## 2017-11-07 LAB — COMPREHENSIVE METABOLIC PANEL
ALBUMIN: 4.1 g/dL (ref 3.5–5.0)
ALK PHOS: 59 U/L (ref 38–126)
ALT: 13 U/L — AB (ref 14–54)
ANION GAP: 10 (ref 5–15)
AST: 20 U/L (ref 15–41)
BUN: 28 mg/dL — ABNORMAL HIGH (ref 6–20)
CHLORIDE: 101 mmol/L (ref 101–111)
CO2: 24 mmol/L (ref 22–32)
Calcium: 9.1 mg/dL (ref 8.9–10.3)
Creatinine, Ser: 1.26 mg/dL — ABNORMAL HIGH (ref 0.44–1.00)
GFR calc non Af Amer: 35 mL/min — ABNORMAL LOW (ref >60)
GFR, EST AFRICAN AMERICAN: 41 mL/min — AB (ref >60)
GLUCOSE: 132 mg/dL — AB (ref 65–99)
Potassium: 4.7 mmol/L (ref 3.5–5.1)
Sodium: 135 mmol/L (ref 135–145)
Total Bilirubin: 0.8 mg/dL (ref 0.3–1.2)
Total Protein: 7.2 g/dL (ref 6.5–8.1)

## 2017-11-07 LAB — CBC WITH DIFFERENTIAL/PLATELET
Basophils Absolute: 0 10*3/uL (ref 0.0–0.1)
Basophils Relative: 1 %
Eosinophils Absolute: 0.1 10*3/uL (ref 0.0–0.7)
Eosinophils Relative: 1 %
HEMATOCRIT: 37.4 % (ref 36.0–46.0)
HEMOGLOBIN: 12.5 g/dL (ref 12.0–15.0)
LYMPHS ABS: 1.4 10*3/uL (ref 0.7–4.0)
Lymphocytes Relative: 33 %
MCH: 30.3 pg (ref 26.0–34.0)
MCHC: 33.4 g/dL (ref 30.0–36.0)
MCV: 90.6 fL (ref 78.0–100.0)
MONO ABS: 0.3 10*3/uL (ref 0.1–1.0)
MONOS PCT: 7 %
NEUTROS ABS: 2.5 10*3/uL (ref 1.7–7.7)
NEUTROS PCT: 58 %
Platelets: 250 10*3/uL (ref 150–400)
RBC: 4.13 MIL/uL (ref 3.87–5.11)
RDW: 15.2 % (ref 11.5–15.5)
WBC: 4.2 10*3/uL (ref 4.0–10.5)

## 2017-11-07 MED ORDER — DOCUSATE SODIUM 100 MG PO CAPS: 100.0000 mg | ORAL_CAPSULE | Freq: Two times a day (BID) | ORAL | 0 refills | Status: DC

## 2017-11-07 MED ORDER — ONDANSETRON 4 MG PO TBDP: 4.0000 mg | ORAL_TABLET | Freq: Three times a day (TID) | ORAL | 0 refills | Status: DC | PRN

## 2017-11-07 MED ORDER — HYDROCODONE-ACETAMINOPHEN 7.5-325 MG PO TABS: 1.0000 | ORAL_TABLET | Freq: Four times a day (QID) | ORAL | 0 refills | Status: DC | PRN

## 2017-11-07 NOTE — ED Provider Notes (Signed)
[facility] Provider Note   CSN: [id number] Arrival date & time: 11/07/17  1044     History   Chief Complaint Chief Complaint  Patient presents with  . Abdominal Pain    HPI [name] is a 84 y.o. female.  Pt presents to the ED today with abdominal pain.  The pt was here on Monday 4/15 for the same.  The pt was diagnosed with UTI on the 15th.  The micro grew out likely contaminated specimen.  The pt also had a ct scan of her abd/pelvis:  IMPRESSION: 1. Scattered diverticulosis along the descending and sigmoid colon, without evidence of diverticulitis. 2. Cystic lesions within the pancreas have increased somewhat in size, the largest of which measures up to 9.3 cm. 3. Minimal bilateral renal atrophy. Bilateral renal parapelvic cysts noted. 4. Tiny hiatal hernia noted.  Aortic Atherosclerosis (ICD10-I70.0).  The pancreatic lesion was d/w the family.  They were aware of this cyst.  The pt said she has n/v, but care giver said she has been eating and drinking.  She also said she's not had a bm for 3 days.     Past Medical History:  Diagnosis Date  . Acid reflux   . Bladder disease   . Cardiomyopathy   . Constipation   . Diverticulosis   . Interstitial cystitis   . Renal disorder     There are no active problems to display for this patient.   Past Surgical History:  Procedure Laterality Date  . HERNIA REPAIR       OB History   None      Home Medications    Prior to Admission medications   Medication Sig Start Date End Date Taking? Authorizing Provider  amLODipine (NORVASC) 5 MG tablet Take 5 mg by mouth daily.   Yes [provider]  busPIRone (BUSPAR) 5 MG tablet Take 5 mg by mouth 2 (two) times daily. 10/01/17  Yes [provider]  cephALEXin (KEFLEX) 250 MG capsule Take 1 capsule (250 mg total) by mouth 2 (two) times daily for 5 days. 11/04/17 11/09/17 Yes Pricilla Loveless, MD    dorzolamide-timolol (COSOPT) 22.3-6.8 MG/ML ophthalmic solution Place 1 drop into both eyes 2 (two) times daily.   Yes [provider]  furosemide (LASIX) 20 MG tablet Take 20 mg by mouth daily.   Yes [provider]  LORazepam (ATIVAN) 1 MG tablet Take 1 tablet (1 mg total) by mouth at bedtime as needed for anxiety. 06/20/16  Yes Pfeiffer, Lebron Conners, MD  polyethylene glycol (MIRALAX / GLYCOLAX) packet Take 17 g by mouth daily. 11/04/17  Yes Pricilla Loveless, MD  docusate sodium (COLACE) 100 MG capsule Take 1 capsule (100 mg total) by mouth every 12 (twelve) hours. 11/07/17   Jacalyn Lefevre, MD  HYDROcodone-acetaminophen (NORCO) 7.5-325 MG tablet Take 1 tablet by mouth every 6 (six) hours as needed for moderate pain. 11/07/17   Jacalyn Lefevre, MD  ondansetron (ZOFRAN ODT) 4 MG disintegrating tablet Take 1 tablet (4 mg total) by mouth every 8 (eight) hours as needed. 11/07/17   Jacalyn Lefevre, MD  phenazopyridine (PYRIDIUM) 200 MG tablet Take 1 tablet (200 mg total) by mouth 3 (three) times daily as needed for pain. Patient not taking: Reported on 11/04/2017 01/29/17   Tyrone Nine, MD    Family History No family history on file.  Social History Social History   Tobacco Use  . Smoking status: Never Smoker  . Smokeless tobacco: Never Used  Substance Use Topics  . Alcohol use: No  . Drug use: No     Allergies   Patient has no known allergies.   Review of Systems Review of Systems  Gastrointestinal: Positive for abdominal pain, constipation, nausea and vomiting.  All other systems reviewed and are negative.    Physical Exam Updated Vital Signs BP (!) 185/65 (BP Location: Right Arm)   Pulse 72   Temp 97.6 F (36.4 C) (Oral)   Resp 18   SpO2 100%   Physical Exam  Constitutional: She is oriented to person, place, and time. She appears well-developed and well-nourished.  HENT:  Head: Normocephalic and atraumatic.  Mouth/Throat: Oropharynx is clear and moist.   Eyes: Pupils are equal, round, and reactive to light. EOM are normal.  Cardiovascular: Normal rate, regular rhythm, normal heart sounds and intact distal pulses.  Pulmonary/Chest: Effort normal and breath sounds normal.  Abdominal: Normal appearance and normal aorta. Bowel sounds are decreased. There is tenderness in the suprapubic area.  Neurological: She is alert and oriented to person, place, and time.  Skin: Skin is warm and dry. Capillary refill takes less than 2 seconds.  Psychiatric: She has a normal mood and affect. Her behavior is normal.  Nursing note and vitals reviewed.    ED Treatments / Results  Labs (all labs ordered are listed, but only abnormal results are displayed) Labs Reviewed  COMPREHENSIVE METABOLIC PANEL - Abnormal; Notable for the following components:      Result Value   Glucose, Bld 132 (*)    BUN 28 (*)    Creatinine, Ser 1.26 (*)    ALT 13 (*)    GFR calc non Af Amer 35 (*)    GFR calc Af Amer 41 (*)    All other components within normal limits  URINE CULTURE  CBC WITH DIFFERENTIAL/PLATELET  URINALYSIS, ROUTINE W REFLEX MICROSCOPIC    EKG None  Radiology Dg Abdomen Acute W/chest  Result Date: 11/07/2017 CLINICAL DATA:  Constipation. EXAM: DG ABDOMEN ACUTE W/ 1V CHEST COMPARISON:  CT abdomen pelvis dated November 04, 2017. Chest x-ray dated October 03, 2013. FINDINGS: There is no evidence of dilated bowel loops or free intraperitoneal air. No abnormal colonic stool burden. Unchanged calcifications in the right upper quadrant and right pelvis. No acute osseous abnormality. Heart size and mediastinal contours are within normal limits. Both lungs are clear. IMPRESSION: Negative abdominal radiographs.  No acute cardiopulmonary disease. Electronically Signed   By: Obie Dredge M.D.   On: 11/07/2017 15:07    Procedures Procedures (including critical care time)  Medications Ordered in ED Medications - No data to display   Initial Impression /  Assessment and Plan / ED Course  I have reviewed the triage vital signs and the nursing notes.  Pertinent labs & imaging results that were available during my care of the patient were reviewed by me and considered in my medical decision making (see chart for details).    Pt is feeling better.  UA today looks good.  It was sent for culture.  I spoke with her daughter who said the pancreatic cyst was evaluated years ago.  At the time, the doctors did not want to do anything about it due to her age.  This could be contributing to her sx.  Her daughter said she has been on pain medications for over 30 years, the same dose.  The pt may be tolerant of them now, so I told daughter to increase dose to 4  times a day up from 3.  Pt encouraged to f/u with GI and with pcp.    Final Clinical Impressions(s) / ED Diagnoses   Final diagnoses:  Generalized abdominal pain  Pancreatic cyst    ED Discharge Orders        Ordered    HYDROcodone-acetaminophen (NORCO) 7.5-325 MG tablet  Every 6 hours PRN     11/07/17 1543    docusate sodium (COLACE) 100 MG capsule  Every 12 hours     11/07/17 1543    ondansetron (ZOFRAN ODT) 4 MG disintegrating tablet  Every 8 hours PRN     11/07/17 1543       Jacalyn LefevreHaviland, Deronda Christian, MD 11/07/17 1547

## 2017-11-07 NOTE — ED Triage Notes (Signed)
Patient was seen and treated on Monday and is having same c/o.

## 2017-11-07 NOTE — ED Notes (Signed)
ED Provider at bedside. 

## 2017-11-09 LAB — URINE CULTURE: CULTURE: NO GROWTH

## 2018-01-28 ENCOUNTER — Emergency Department (HOSPITAL_COMMUNITY): Payer: Medicare Other

## 2018-01-28 ENCOUNTER — Encounter (HOSPITAL_COMMUNITY): Payer: Self-pay | Admitting: Emergency Medicine

## 2018-01-28 ENCOUNTER — Observation Stay (HOSPITAL_COMMUNITY)
Admission: EM | Admit: 2018-01-28 | Discharge: 2018-01-29 | Disposition: A | Discharge status: Home / Self Care | Payer: Medicare Other | Attending: Family Medicine | Admitting: Family Medicine

## 2018-01-28 DIAGNOSIS — I129 Hypertensive chronic kidney disease with stage 1 through stage 4 chronic kidney disease, or unspecified chronic kidney disease: Secondary | ICD-10-CM | POA: Insufficient documentation

## 2018-01-28 DIAGNOSIS — E860 Dehydration: Secondary | ICD-10-CM | POA: Diagnosis present

## 2018-01-28 DIAGNOSIS — Z79899 Other long term (current) drug therapy: Secondary | ICD-10-CM | POA: Insufficient documentation

## 2018-01-28 DIAGNOSIS — Z9889 Other specified postprocedural states: Secondary | ICD-10-CM | POA: Insufficient documentation

## 2018-01-28 DIAGNOSIS — I429 Cardiomyopathy, unspecified: Secondary | ICD-10-CM | POA: Diagnosis not present

## 2018-01-28 DIAGNOSIS — K5901 Slow transit constipation: Secondary | ICD-10-CM | POA: Diagnosis present

## 2018-01-28 DIAGNOSIS — N183 Chronic kidney disease, stage 3 (moderate): Secondary | ICD-10-CM | POA: Diagnosis not present

## 2018-01-28 DIAGNOSIS — K5900 Constipation, unspecified: Secondary | ICD-10-CM | POA: Diagnosis present

## 2018-01-28 DIAGNOSIS — I10 Essential (primary) hypertension: Secondary | ICD-10-CM | POA: Diagnosis present

## 2018-01-28 DIAGNOSIS — K219 Gastro-esophageal reflux disease without esophagitis: Secondary | ICD-10-CM | POA: Diagnosis not present

## 2018-01-28 MED ORDER — ONDANSETRON 4 MG PO TBDP
4.0000 mg | ORAL_TABLET | Freq: Once | ORAL | Status: AC
  Administered 2018-01-29: 4 mg via ORAL
  Filled 2018-01-28: qty 1

## 2018-01-28 NOTE — ED Triage Notes (Signed)
Patient BIB caregiver, c/o constipation and generalized abdominal pain. Reports last BM x3 days ago. Denies N/V.

## 2018-01-28 NOTE — ED Provider Notes (Signed)
[facility]-EMERGENCY DEPT Provider Note   CSN: [id number] Arrival date & time: 01/28/18  2149     History   Chief Complaint Chief Complaint  Patient presents with  . Constipation    HPI [name] is a 84 y.o. female.  The history is provided by the patient and a caregiver.  Constipation   This is a new problem. The current episode started more than 2 days ago. Associated symptoms include abdominal pain. Pertinent negatives include no dysuria. Treatments tried: enema. The treatment provided no relief.  pt has had constipation for 4 days She reports nausea and abd pain It is not improved with enema She has had decreased PO fluids No fever/vomiting No other complaints  Past Medical History:  Diagnosis Date  . Acid reflux   . Bladder disease   . Cardiomyopathy   . Constipation   . Diverticulosis   . Interstitial cystitis   . Renal disorder     There are no active problems to display for this patient.   Past Surgical History:  Procedure Laterality Date  . HERNIA REPAIR       OB History   None      Home Medications    Prior to Admission medications   Medication Sig Start Date End Date Taking? Authorizing Provider  amLODipine (NORVASC) 5 MG tablet Take 5 mg by mouth daily.    [provider]  busPIRone (BUSPAR) 5 MG tablet Take 5 mg by mouth 2 (two) times daily. 10/01/17   [provider]  docusate sodium (COLACE) 100 MG capsule Take 1 capsule (100 mg total) by mouth every 12 (twelve) hours. 11/07/17   Jacalyn Lefevre, MD  dorzolamide-timolol (COSOPT) 22.3-6.8 MG/ML ophthalmic solution Place 1 drop into both eyes 2 (two) times daily.    [provider]  furosemide (LASIX) 20 MG tablet Take 20 mg by mouth daily.    [provider]  HYDROcodone-acetaminophen (NORCO) 7.5-325 MG tablet Take 1 tablet by mouth every 6 (six) hours as needed for moderate pain. 11/07/17   Jacalyn Lefevre, MD  LORazepam  (ATIVAN) 1 MG tablet Take 1 tablet (1 mg total) by mouth at bedtime as needed for anxiety. 06/20/16   Arby Barrette, MD  ondansetron (ZOFRAN ODT) 4 MG disintegrating tablet Take 1 tablet (4 mg total) by mouth every 8 (eight) hours as needed. 11/07/17   Jacalyn Lefevre, MD  phenazopyridine (PYRIDIUM) 200 MG tablet Take 1 tablet (200 mg total) by mouth 3 (three) times daily as needed for pain. Patient not taking: Reported on 11/04/2017 01/29/17   Tyrone Nine, MD  polyethylene glycol [facility] / Ethelene Hal) packet Take 17 g by mouth daily. 11/04/17   Pricilla Loveless, MD    Family History No family history on file.  Social History Social History   Tobacco Use  . Smoking status: Never Smoker  . Smokeless tobacco: Never Used  Substance Use Topics  . Alcohol use: No  . Drug use: No     Allergies   Patient has no known allergies.   Review of Systems Review of Systems  Constitutional: Negative for fever.  Cardiovascular: Negative for chest pain.  Gastrointestinal: Positive for abdominal pain and constipation. Negative for vomiting.  Genitourinary: Negative for dysuria.  All other systems reviewed and are negative.    Physical Exam Updated Vital Signs BP (!) 128/53 (BP Location: Left Arm)   Pulse 96   Temp 98.1 F (36.7 C) (Oral)   Resp 16  Ht 1.6 m (5\' 3" )   Wt 63.5 kg (140 lb)   SpO2 100%   BMI 24.80 kg/m   Physical Exam CONSTITUTIONAL: elderly/frail HEAD: Normocephalic/atraumatic EYES: EOMI/PERRL, no icterus ENMT: Mucous membranes dry NECK: supple no meningeal signs SPINE/BACK:no cspine tenderness   CV: S1/S2 noted, murmur LUNGS: Lungs are clear to auscultation bilaterally, no apparent distress ABDOMEN: soft, protuberant mild diffuse tenderness , no rebound or guarding, bowel sounds noted throughout abdomen Rectal - fecal impaction, no blood/melena noted.  No masses.  Female chaperone present NEURO: Pt is awake/alert/appropriate, moves all extremitiesx4.      EXTREMITIES: pulses normal/equal, full ROM SKIN: warm, color normal PSYCH: flat affect   ED Treatments / Results  Labs (all labs ordered are listed, but only abnormal results are displayed) Labs Reviewed  COMPREHENSIVE METABOLIC PANEL - Abnormal; Notable for the following components:      Result Value   Potassium 5.2 (*)    Glucose, Bld 126 (*)    BUN 42 (*)    Creatinine, Ser 1.78 (*)    GFR calc non Af Amer 23 (*)    GFR calc Af Amer 27 (*)    All other components within normal limits  LIPASE, BLOOD  CBC WITH DIFFERENTIAL/PLATELET  CBC WITH DIFFERENTIAL/PLATELET  URINALYSIS, ROUTINE W REFLEX MICROSCOPIC    EKG None  Radiology Dg Abdomen Acute W/chest  Result Date: 01/29/2018 CLINICAL DATA:  Generalized abdominal pain. Constipation for 3 days. EXAM: DG ABDOMEN ACUTE W/ 1V CHEST COMPARISON:  Radiograph 11/07/2017.  CT 11/04/2017 FINDINGS: The cardiomediastinal contours are normal. The lungs are clear. Unchanged heart size and mediastinal contours with aortic atherosclerosis. There is no free intra-abdominal air. No dilated bowel loops to suggest obstruction. Stools 10 the rectum spanning 7 cm. Small volume of colonic stool elsewhere. Right pelvic calcification likely correspond uterine calcifications, are unchanged dating back to 2016 and benign. No acute osseous abnormalities are seen. IMPRESSION: 1. Stool distended rectum raises concern for fecal impaction. Small volume of colonic stool elsewhere. No bowel obstruction. 2.  No acute pulmonary process. Electronically Signed   By: Rubye Oaks M.D.   On: 01/29/2018 00:02    Procedures Fecal disimpaction Date/Time: 01/28/2018 11:37 PM Performed by: Zadie Rhine, MD Authorized by: Zadie Rhine, MD  Consent: Verbal consent obtained. Consent given by: patient Patient tolerance: Patient tolerated the procedure well with no immediate complications Comments: Patient with significant stool impaction requiring fecal  disimpaction.  Patient tolerated well      Medications Ordered in ED Medications  sodium chloride 0.9 % bolus 500 mL (has no administration in time range)  ondansetron (ZOFRAN-ODT) disintegrating tablet 4 mg (4 mg Oral Given 01/29/18 0042)     Initial Impression / Assessment and Plan / ED Course  I have reviewed the triage vital signs and the nursing notes.  Pertinent labs & imaging results that were available during my care of the patient were reviewed by me and considered in my medical decision making (see chart for details).     1:35 AM X-ray shows significant constipation.  She has mild lower abdominal tenderness.  She is also dehydrated.  I feel she would benefit from admission, treatment for her constipation as well as dehydration.  My suspicion for other acute abdominal emergency is low at this time. Caregiver updated on plan 3:00 AM Discussed with Dr. Toniann Fail for admission.  She will need rehydration, as well as treatment of her constipation.  Final Clinical Impressions(s) / ED Diagnoses   Final diagnoses:  Slow transit constipation  Dehydration    ED Discharge Orders    None       Zadie RhineWickline, Eliazar Olivar, MD 01/29/18 0300

## 2018-01-29 ENCOUNTER — Encounter (HOSPITAL_COMMUNITY): Payer: Self-pay | Admitting: Internal Medicine

## 2018-01-29 ENCOUNTER — Other Ambulatory Visit: Payer: Self-pay

## 2018-01-29 DIAGNOSIS — E860 Dehydration: Secondary | ICD-10-CM

## 2018-01-29 DIAGNOSIS — K5901 Slow transit constipation: Secondary | ICD-10-CM

## 2018-01-29 DIAGNOSIS — I10 Essential (primary) hypertension: Secondary | ICD-10-CM | POA: Diagnosis not present

## 2018-01-29 DIAGNOSIS — K5900 Constipation, unspecified: Secondary | ICD-10-CM | POA: Diagnosis present

## 2018-01-29 LAB — CBC WITH DIFFERENTIAL/PLATELET
BASOS ABS: 0 10*3/uL (ref 0.0–0.1)
Basophils Relative: 0 %
EOS PCT: 1 %
Eosinophils Absolute: 0.1 10*3/uL (ref 0.0–0.7)
HCT: 39.5 % (ref 36.0–46.0)
HEMOGLOBIN: 13 g/dL (ref 12.0–15.0)
LYMPHS PCT: 15 %
Lymphs Abs: 1.2 10*3/uL (ref 0.7–4.0)
MCH: 30.6 pg (ref 26.0–34.0)
MCHC: 32.9 g/dL (ref 30.0–36.0)
MCV: 92.9 fL (ref 78.0–100.0)
Monocytes Absolute: 0.6 10*3/uL (ref 0.1–1.0)
Monocytes Relative: 8 %
NEUTROS PCT: 76 %
Neutro Abs: 6.5 10*3/uL (ref 1.7–7.7)
PLATELETS: 283 10*3/uL (ref 150–400)
RBC: 4.25 MIL/uL (ref 3.87–5.11)
RDW: 14.5 % (ref 11.5–15.5)
WBC: 8.4 10*3/uL (ref 4.0–10.5)

## 2018-01-29 LAB — COMPREHENSIVE METABOLIC PANEL
ALBUMIN: 4.1 g/dL (ref 3.5–5.0)
ALK PHOS: 59 U/L (ref 38–126)
ALT: 17 U/L (ref 0–44)
ANION GAP: 13 (ref 5–15)
AST: 32 U/L (ref 15–41)
BUN: 42 mg/dL — ABNORMAL HIGH (ref 8–23)
CHLORIDE: 101 mmol/L (ref 98–111)
CO2: 22 mmol/L (ref 22–32)
CREATININE: 1.78 mg/dL — AB (ref 0.44–1.00)
Calcium: 9.3 mg/dL (ref 8.9–10.3)
GFR calc non Af Amer: 23 mL/min — ABNORMAL LOW (ref >60)
GFR, EST AFRICAN AMERICAN: 27 mL/min — AB (ref >60)
Glucose, Bld: 126 mg/dL — ABNORMAL HIGH (ref 70–99)
POTASSIUM: 5.2 mmol/L — AB (ref 3.5–5.1)
SODIUM: 136 mmol/L (ref 135–145)
Total Bilirubin: 0.9 mg/dL (ref 0.3–1.2)
Total Protein: 7.3 g/dL (ref 6.5–8.1)

## 2018-01-29 LAB — LIPASE, BLOOD: LIPASE: 24 U/L (ref 11–51)

## 2018-01-29 MED ORDER — LORAZEPAM 0.5 MG PO TABS: 0.5000 mg | ORAL_TABLET | Freq: Every evening | ORAL | Status: DC | PRN

## 2018-01-29 MED ORDER — POLYETHYLENE GLYCOL 3350 17 G PO PACK
17.0000 g | PACK | Freq: Every day | ORAL | Status: DC
  Administered 2018-01-29: 17 g via ORAL
  Filled 2018-01-29: qty 1

## 2018-01-29 MED ORDER — DORZOLAMIDE HCL-TIMOLOL MAL 2-0.5 % OP SOLN
1.0000 [drp] | Freq: Two times a day (BID) | OPHTHALMIC | Status: DC
  Administered 2018-01-29: 1 [drp] via OPHTHALMIC
  Filled 2018-01-29: qty 10

## 2018-01-29 MED ORDER — SODIUM CHLORIDE 0.9 % IV SOLN
INTRAVENOUS | Status: AC
  Administered 2018-01-29: 05:00:00 via INTRAVENOUS

## 2018-01-29 MED ORDER — DOCUSATE SODIUM 100 MG PO CAPS
100.0000 mg | ORAL_CAPSULE | Freq: Two times a day (BID) | ORAL | Status: DC
  Administered 2018-01-29: 100 mg via ORAL
  Filled 2018-01-29: qty 1

## 2018-01-29 MED ORDER — ENOXAPARIN SODIUM 30 MG/0.3ML ~~LOC~~ SOLN
30.0000 mg | Freq: Every day | SUBCUTANEOUS | Status: DC
  Administered 2018-01-29: 30 mg via SUBCUTANEOUS
  Filled 2018-01-29: qty 0.3

## 2018-01-29 MED ORDER — ACETAMINOPHEN 650 MG RE SUPP: 650.0000 mg | Freq: Four times a day (QID) | RECTAL | Status: DC | PRN

## 2018-01-29 MED ORDER — DOCUSATE SODIUM 100 MG PO CAPS: 100.0000 mg | ORAL_CAPSULE | Freq: Two times a day (BID) | ORAL | 0 refills | Status: DC

## 2018-01-29 MED ORDER — ONDANSETRON HCL 4 MG PO TABS: 4.0000 mg | ORAL_TABLET | Freq: Four times a day (QID) | ORAL | Status: DC | PRN

## 2018-01-29 MED ORDER — ACETAMINOPHEN 325 MG PO TABS: 650.0000 mg | ORAL_TABLET | Freq: Four times a day (QID) | ORAL | Status: DC | PRN

## 2018-01-29 MED ORDER — AMLODIPINE BESYLATE 5 MG PO TABS
5.0000 mg | ORAL_TABLET | Freq: Every day | ORAL | Status: DC
  Administered 2018-01-29: 5 mg via ORAL
  Filled 2018-01-29: qty 1

## 2018-01-29 MED ORDER — ONDANSETRON HCL 4 MG/2ML IJ SOLN: 4.0000 mg | Freq: Four times a day (QID) | INTRAMUSCULAR | Status: DC | PRN

## 2018-01-29 MED ORDER — SODIUM CHLORIDE 0.9 % IV BOLUS (SEPSIS)
500.0000 mL | Freq: Once | INTRAVENOUS | Status: AC
  Administered 2018-01-29: 500 mL via INTRAVENOUS

## 2018-01-29 MED ORDER — SENNA 8.6 MG PO TABS
1.0000 | ORAL_TABLET | Freq: Every day | ORAL | Status: DC
  Administered 2018-01-29: 8.6 mg via ORAL
  Filled 2018-01-29: qty 1

## 2018-01-29 NOTE — Care Management Note (Signed)
Case Management Note  Patient Details  Name: [name] MRN: [id number] Date of Birth: 04/15/1921  Subjective/Objective: Spoke to dtr [name] on phone-[phone]-d/c plans return back home. Has custodial level private duty care already in place-24hr/dy. Family will provide own transp home-aware of d/c today. No CM needs.                  Action/Plan:d/c home.   Expected Discharge Date:                  Expected Discharge Plan:  Home/Self Care  In-House Referral:     Discharge planning Services  CM Consult  Post Acute Care Choice:  Resumption of Svcs/PTA Provider(Has custodial level care 24hr/dy) Choice offered to:     DME Arranged:    DME Agency:     HH Arranged:    HH Agency:     Status of Service:  Completed, signed off  If discussed at [employer] Length of Stay Meetings, dates discussed:    Additional Comments:  Lanier ClamMahabir, Jerrie Schussler, RN 01/29/2018, 11:12 AM

## 2018-01-29 NOTE — Progress Notes (Signed)
Patient has discharged to home on 01/29/18. Discharge instruction including medication and appointment was given to patient and family member. Daughter will take patient home by car.

## 2018-01-29 NOTE — ED Notes (Signed)
ED TO INPATIENT HANDOFF REPORT  Name/Age/Gender [name] 84 y.o. female  Code Status   Home/SNF/Other Home  Chief Complaint constipation  Level of Care/Admitting Diagnosis ED Disposition    ED Disposition Condition [facility] Area: [place] [100102]  Level of Care: Med-Surg [16]  Diagnosis: Dehydration [276.51.ICD-9-CM]  Admitting Physician: Rise Patience [phone]  Attending Physician: Rise Patience [phone]  PT Class (Do Not Modify): Observation [104]  PT Acc Code (Do Not Modify): Observation [10022]       Medical History Past Medical History:  Diagnosis Date  . Acid reflux   . Bladder disease   . Cardiomyopathy   . Constipation   . Diverticulosis   . Interstitial cystitis   . Renal disorder     Allergies No Known Allergies  IV Location/Drains/Wounds Patient Lines/Drains/Airways Status   Active Line/Drains/Airways    Name:   Placement date:   Placement time:   Site:   Days:   Peripheral IV 01/29/18 Right;Upper Arm   01/29/18    0215    Arm   less than 1          Labs/Imaging Results for orders placed or performed during the hospital encounter of 01/28/18 (from the past 48 hour(s))  Comprehensive metabolic panel     Status: Abnormal   Collection Time: 01/29/18 12:06 AM  Result Value Ref Range   Sodium 136 135 - 145 mmol/L   Potassium 5.2 (H) 3.5 - 5.1 mmol/L   Chloride 101 98 - 111 mmol/L    Comment: Please note change in reference range.   CO2 22 22 - 32 mmol/L   Glucose, Bld 126 (H) 70 - 99 mg/dL    Comment: Please note change in reference range.   BUN 42 (H) 8 - 23 mg/dL    Comment: Please note change in reference range.   Creatinine, Ser 1.78 (H) 0.44 - 1.00 mg/dL   Calcium 9.3 8.9 - 10.3 mg/dL   Total Protein 7.3 6.5 - 8.1 g/dL   Albumin 4.1 3.5 - 5.0 g/dL   AST 32 15 - 41 U/L   ALT 17 0 - 44 U/L    Comment: Please note change in reference range.   Alkaline Phosphatase 59 38 - 126  U/L   Total Bilirubin 0.9 0.3 - 1.2 mg/dL   GFR calc non Af Amer 23 (L) >60 mL/min   GFR calc Af Amer 27 (L) >60 mL/min    Comment: (NOTE) The eGFR has been calculated using the CKD EPI equation. This calculation has not been validated in all clinical situations. eGFR's persistently <60 mL/min signify possible Chronic Kidney Disease.    Anion gap 13 5 - 15    Comment: Performed at [facility], [place] [address] [place], [place] [address]  Lipase, blood     Status: None   Collection Time: 01/29/18 12:06 AM  Result Value Ref Range   Lipase 24 11 - 51 U/L    Comment: Performed at [facility], [place] [address] [place], [place] [address]  CBC with Differential/Platelet     Status: None   Collection Time: 01/29/18  2:18 AM  Result Value Ref Range   WBC 8.4 4.0 - 10.5 K/uL   RBC 4.25 3.87 - 5.11 MIL/uL   Hemoglobin 13.0 12.0 - 15.0 g/dL   HCT 39.5 36.0 - 46.0 %   MCV 92.9 78.0 - 100.0 fL   MCH 30.6 26.0 -  34.0 pg   MCHC 32.9 30.0 - 36.0 g/dL   RDW 14.5 11.5 - 15.5 %   Platelets 283 150 - 400 K/uL   Neutrophils Relative % 76 %   Neutro Abs 6.5 1.7 - 7.7 K/uL   Lymphocytes Relative 15 %   Lymphs Abs 1.2 0.7 - 4.0 K/uL   Monocytes Relative 8 %   Monocytes Absolute 0.6 0.1 - 1.0 K/uL   Eosinophils Relative 1 %   Eosinophils Absolute 0.1 0.0 - 0.7 K/uL   Basophils Relative 0 %   Basophils Absolute 0.0 0.0 - 0.1 K/uL    Comment: Performed at [facility], [place] [address], [place], [place] [address]   Dg Abdomen Acute W/chest  Result Date: 01/29/2018 CLINICAL DATA:  Generalized abdominal pain. Constipation for 3 days. EXAM: DG ABDOMEN ACUTE W/ 1V CHEST COMPARISON:  Radiograph 11/07/2017.  CT 11/04/2017 FINDINGS: The cardiomediastinal contours are normal. The lungs are clear. Unchanged heart size and mediastinal contours with aortic atherosclerosis. There is no free intra-abdominal air. No dilated bowel loops to suggest obstruction.  Stools 10 the rectum spanning 7 cm. Small volume of colonic stool elsewhere. Right pelvic calcification likely correspond uterine calcifications, are unchanged dating back to 2016 and benign. No acute osseous abnormalities are seen. IMPRESSION: 1. Stool distended rectum raises concern for fecal impaction. Small volume of colonic stool elsewhere. No bowel obstruction. 2.  No acute pulmonary process. Electronically Signed   By: Jeb Levering M.D.   On: 01/29/2018 00:02    Pending Labs Unresulted Labs (From admission, onward)   Start     Ordered   01/29/18 0120  Urinalysis, Routine w reflex microscopic  Once,   R     01/29/18 0119   01/28/18 2309  CBC with Differential/Platelet  Once,   R     01/28/18 2309      Vitals/Pain Today's Vitals   01/29/18 0000 01/29/18 0100 01/29/18 0130 01/29/18 0200  BP: (!) 131/55 139/61 (!) 133/53 (!) 142/49  Pulse: 75 81 81 84  Resp:      Temp:      TempSrc:      SpO2: 100% 100% 100% 100%  Weight:      Height:        Isolation Precautions No active isolations  Medications Medications  ondansetron (ZOFRAN-ODT) disintegrating tablet 4 mg (4 mg Oral Given 01/29/18 0042)  sodium chloride 0.9 % bolus 500 mL (0 mLs Intravenous Stopped 01/29/18 0257)    Mobility Nonambulatory

## 2018-01-29 NOTE — H&P (Signed)
History and Physical    Leslie HalsBernice F Axelson ZOX:[id number] DOB: [birth year] DOA: 01/28/2018  PCP: Corine ShelterKilpatrick, George, MD  Patient coming from: Home.  Chief Complaint: Difficulty to move bowels.  HPI: [name] is a 84 y.o. female with history of hypertension, chronic kidney disease cardiomyopathy presents to the ER with complaints of having difficulty to move bowels over the last 4 days.  Patient has been trying over-the-counter medications and has tried Fleet enema despite which has not had good results.  Complains of pain around the rectum.  Denies any vomiting.  ED Course: In the ER x-rays showed large stool burden.  ER physician tried to disimpact had minimal results and has been admitted for further management.  Review of Systems: As per HPI, rest all negative.   Past Medical History:  Diagnosis Date  . Acid reflux   . Bladder disease   . Cardiomyopathy   . Constipation   . Diverticulosis   . Interstitial cystitis   . Renal disorder     Past Surgical History:  Procedure Laterality Date  . HERNIA REPAIR       reports that she has never smoked. She has never used smokeless tobacco. She reports that she does not drink alcohol or use drugs.  No Known Allergies  Family History  Family history unknown: Yes    Prior to Admission medications   Medication Sig Start Date End Date Taking? Authorizing Provider  amLODipine (NORVASC) 5 MG tablet Take 5 mg by mouth daily.   Yes [provider]  docusate sodium (COLACE) 100 MG capsule Take 1 capsule (100 mg total) by mouth every 12 (twelve) hours. Patient taking differently: Take 100 mg by mouth every 12 (twelve) hours as needed for mild constipation.  11/07/17  Yes Jacalyn LefevreHaviland, Julie, MD  dorzolamide-timolol (COSOPT) 22.3-6.8 MG/ML ophthalmic solution Place 1 drop into both eyes 2 (two) times daily.   Yes [provider]  furosemide (LASIX) 20 MG tablet Take 20 mg by mouth daily.   Yes [provider]   HYDROcodone-acetaminophen (NORCO/VICODIN) 5-325 MG tablet Take 1 tablet by mouth every 4 (four) hours as needed for moderate pain. for pain 12/19/17  Yes [provider]  LORazepam (ATIVAN) 0.5 MG tablet Take 0.5 mg by mouth at bedtime as needed for anxiety.  12/19/17  Yes [provider]  LORazepam (ATIVAN) 1 MG tablet Take 1 tablet (1 mg total) by mouth at bedtime as needed for anxiety. 06/20/16  Yes Pfeiffer, Lebron ConnersMarcy, MD  polyethylene glycol (MIRALAX / GLYCOLAX) packet Take 17 g by mouth daily. 11/04/17  Yes Pricilla LovelessGoldston, Scott, MD  Sodium Phosphates (FLEET ENEMA RE) Place 1 application rectally 2 (two) times daily as needed (constipation).   Yes [provider]  ondansetron (ZOFRAN ODT) 4 MG disintegrating tablet Take 1 tablet (4 mg total) by mouth every 8 (eight) hours as needed. Patient not taking: Reported on 01/28/2018 11/07/17   Jacalyn LefevreHaviland, Julie, MD    Physical Exam: Vitals:   01/29/18 0230 01/29/18 0300 01/29/18 0330 01/29/18 0415  BP: (!) 142/48 (!) 158/39 (!) 151/53 (!) 132/42  Pulse: 77 90 80 78  Resp:    15  Temp:    97.8 F (36.6 C)  TempSrc:    Oral  SpO2: 98% 97% 100% 100%  Weight:    62.7 kg (138 lb 3.7 oz)  Height:    5\' 3"  (1.6 m)      Constitutional: Moderately built and nourished. Vitals:   01/29/18 0230 01/29/18 0300  01/29/18 0330 01/29/18 0415  BP: (!) 142/48 (!) 158/39 (!) 151/53 (!) 132/42  Pulse: 77 90 80 78  Resp:    15  Temp:    97.8 F (36.6 C)  TempSrc:    Oral  SpO2: 98% 97% 100% 100%  Weight:    62.7 kg (138 lb 3.7 oz)  Height:    5\' 3"  (1.6 m)   Eyes: Anicteric no pallor. ENMT: No discharge from the ears eyes nose or mouth. Neck: No mass felt.  No neck rigidity. Respiratory: No rhonchi or crepitations. Cardiovascular: S1-S2 heard no murmurs appreciated. Abdomen: Soft nontender bowel sounds present.  No rigidity or no rebound tenderness. Musculoskeletal: No edema. Skin: No rash. Neurologic: Alert awake oriented to time  place and person.  Moves all extremities. Psychiatric: Appears normal.  Normal affect.   Labs on Admission: I have personally reviewed following labs and imaging studies  CBC: Recent Labs  Lab 01/29/18 0218  WBC 8.4  NEUTROABS 6.5  HGB 13.0  HCT 39.5  MCV 92.9  PLT 283   Basic Metabolic Panel: Recent Labs  Lab 01/29/18 0006  NA 136  K 5.2*  CL 101  CO2 22  GLUCOSE 126*  BUN 42*  CREATININE 1.78*  CALCIUM 9.3   GFR: Estimated Creatinine Clearance: 16 mL/min (A) (by C-G formula based on SCr of 1.78 mg/dL (H)). Liver Function Tests: Recent Labs  Lab 01/29/18 0006  AST 32  ALT 17  ALKPHOS 59  BILITOT 0.9  PROT 7.3  ALBUMIN 4.1   Recent Labs  Lab 01/29/18 0006  LIPASE 24   No results for input(s): AMMONIA in the last 168 hours. Coagulation Profile: No results for input(s): INR, PROTIME in the last 168 hours. Cardiac Enzymes: No results for input(s): CKTOTAL, CKMB, CKMBINDEX, TROPONINI in the last 168 hours. BNP (last 3 results) No results for input(s): PROBNP in the last 8760 hours. HbA1C: No results for input(s): HGBA1C in the last 72 hours. CBG: No results for input(s): GLUCAP in the last 168 hours. Lipid Profile: No results for input(s): CHOL, HDL, LDLCALC, TRIG, CHOLHDL, LDLDIRECT in the last 72 hours. Thyroid Function Tests: No results for input(s): TSH, T4TOTAL, FREET4, T3FREE, THYROIDAB in the last 72 hours. Anemia Panel: No results for input(s): VITAMINB12, FOLATE, FERRITIN, TIBC, IRON, RETICCTPCT in the last 72 hours. Urine analysis:    Component Value Date/Time   COLORURINE YELLOW 11/07/2017 1423   APPEARANCEUR CLEAR 11/07/2017 1423   LABSPEC 1.014 11/07/2017 1423   PHURINE 6.0 11/07/2017 1423   GLUCOSEU NEGATIVE 11/07/2017 1423   HGBUR NEGATIVE 11/07/2017 1423   BILIRUBINUR NEGATIVE 11/07/2017 1423   KETONESUR NEGATIVE 11/07/2017 1423   PROTEINUR NEGATIVE 11/07/2017 1423   UROBILINOGEN 1.0 01/29/2017 1142   NITRITE NEGATIVE  11/07/2017 1423   LEUKOCYTESUR NEGATIVE 11/07/2017 1423   Sepsis Labs: @LABRCNTIP (procalcitonin:4,lacticidven:4) )No results found for this or any previous visit (from the past 240 hour(s)).   Radiological Exams on Admission: Dg Abdomen Acute W/chest  Result Date: 01/29/2018 CLINICAL DATA:  Generalized abdominal pain. Constipation for 3 days. EXAM: DG ABDOMEN ACUTE W/ 1V CHEST COMPARISON:  Radiograph 11/07/2017.  CT 11/04/2017 FINDINGS: The cardiomediastinal contours are normal. The lungs are clear. Unchanged heart size and mediastinal contours with aortic atherosclerosis. There is no free intra-abdominal air. No dilated bowel loops to suggest obstruction. Stools 10 the rectum spanning 7 cm. Small volume of colonic stool elsewhere. Right pelvic calcification likely correspond uterine calcifications, are unchanged dating back to 2016 and benign. No acute  osseous abnormalities are seen. IMPRESSION: 1. Stool distended rectum raises concern for fecal impaction. Small volume of colonic stool elsewhere. No bowel obstruction. 2.  No acute pulmonary process. Electronically Signed   By: Rubye Oaks M.D.   On: 01/29/2018 00:02     Assessment/Plan Principal Problem:   Slow transit constipation Active Problems:   Dehydration   Essential hypertension   Constipation    1. Severe constipation -patient has chronic constipation and has acutely worsened.  Stool disimpaction was tried in the ER with minimal results.  I have ordered soapsuds enema and added senna.  If there is no good results may need GoLYTELY.  Check TSH with next blood draw. 2. Acute on chronic kidney disease stage III -gently hydrate.  Closely monitor respiratory status since chart mentions cardiomyopathy. 3. Hypertension on Norvasc.   DVT prophylaxis: Lovenox. Code Status: Full code. Family Communication: Discussed with patient. Disposition Plan: Home. Consults called: None. Admission status: Observation.   Eduard Clos MD [facility] Pager [phone].  If 7PM-7AM, please contact night-coverage [email or web address] Password [email or web address]  01/29/2018, 5:03 AM

## 2018-01-29 NOTE — Care Management Obs Status (Signed)
MEDICARE OBSERVATION STATUS NOTIFICATION   Patient Details  Name: [name] MRN: [id number] Date of Birth: 11/30/1920   Medicare Observation Status Notification Given:  Yes    MahabirOlegario Messier, Arsh Feutz, RN 01/29/2018, 11:16 AM

## 2018-01-29 NOTE — Discharge Summary (Addendum)
Physician Discharge Summary  Leslie HalsBernice F Ose  WUJ:[id number]  DOB: 04/01/1921  DOA: 01/28/2018 PCP: Corine ShelterKilpatrick, George, MD  Admit date: 01/28/2018 Discharge date: 01/29/2018  Admitted From: Home  Disposition: Home   Recommendations for Outpatient Follow-up:  1. Follow up with PCP in 1 weeks 2. Please obtain BMP/CBC in one week to monitor renal function and electrolytes   Discharge Condition: Stable  CODE STATUS: Full Code  Diet recommendation: Heart Healthy  Brief/Interim Summary: For full details see H&P/Progress note, but in brief, [name] is a 84 year old female with history of hypertension, chronic kidney disease, cardiomyopathy who presented to the emergency department complaining of constipation for 4 days.  Patient tried over-the-counter medication and Fleet enema but had no results.  Patient was also complained with pain around the rectum.  Upon ED evaluation x-ray showed large stool burden.  ER physician tried to disimpact with minimal results and patient was placed in observation for further management.  Patient was given MiraLAX, Senokot and the Colace.  He was added a tap water enema with successful large valve movement x2.  Abdominal pain has resolved and patient was deemed stable  for discharge and follow-up as an outpatient.  Subjective: Patient seen and examined she has no complaints.  Reports no nausea or vomiting denies abdominal pain.  No acute events overnight.  Patient tolerating diet well.  Discharge Diagnoses/Hospital Course:  Severe constipation Patient with chronic constipation which acutely worsened due to dehydration.  Failed stool disimpaction in the ED.  Patient was treated with soapsuds enema, MiraLAX, Senokot and Colace as well tap water enema.  Patient had a large bowel movement type II and symptoms has resolved.  Acute on chronic CKD III Due to dehydration, patient was gently hydrated.  Patient tolerated oral diet well.  Encouraged to continue  p.o. hydration and follow-up with primary care physician.  Check BMP in 1 week  Hypertension BP stable continue Norvasc with no changes.  All other chronic medical condition were stable during the hospitalization.  On the day of the discharge the patient's vitals were stable, and no other acute medical condition were reported by patient. the patient was felt safe to be discharge to home  Discharge Instructions  You were cared for by a hospitalist during your hospital stay. If you have any questions about your discharge medications or the care you received while you were in the hospital after you are discharged, you can call the unit and asked to speak with the hospitalist on call if the hospitalist that took care of you is not available. Once you are discharged, your primary care physician will handle any further medical issues. Please note that NO REFILLS for any discharge medications will be authorized once you are discharged, as it is imperative that you return to your primary care physician (or establish a relationship with a primary care physician if you do not have one) for your aftercare needs so that they can reassess your need for medications and monitor your lab values.  Discharge Instructions    Call MD for:  difficulty breathing, headache or visual disturbances   Complete by:  As directed    Call MD for:  extreme fatigue   Complete by:  As directed    Call MD for:  hives   Complete by:  As directed    Call MD for:  persistant dizziness or light-headedness   Complete by:  As directed    Call MD for:  persistant nausea and vomiting  Complete by:  As directed    Call MD for:  redness, tenderness, or signs of infection (pain, swelling, redness, odor or green/yellow discharge around incision site)   Complete by:  As directed    Call MD for:  severe uncontrolled pain   Complete by:  As directed    Call MD for:  temperature >100.4   Complete by:  As directed    Diet - low sodium  heart healthy   Complete by:  As directed    Increase activity slowly   Complete by:  As directed      Allergies as of 01/29/2018   No Known Allergies     Medication List    STOP taking these medications   ondansetron 4 MG disintegrating tablet Commonly known as:  ZOFRAN ODT     TAKE these medications   amLODipine 5 MG tablet Commonly known as:  NORVASC Take 5 mg by mouth daily.   docusate sodium 100 MG capsule Commonly known as:  COLACE Take 1 capsule (100 mg total) by mouth every 12 (twelve) hours. What changed:    when to take this  reasons to take this   dorzolamide-timolol 22.3-6.8 MG/ML ophthalmic solution Commonly known as:  COSOPT Place 1 drop into both eyes 2 (two) times daily.   FLEET ENEMA RE Place 1 application rectally 2 (two) times daily as needed (constipation).   furosemide 20 MG tablet Commonly known as:  LASIX Take 20 mg by mouth daily.   HYDROcodone-acetaminophen 5-325 MG tablet Commonly known as:  NORCO/VICODIN Take 1 tablet by mouth every 4 (four) hours as needed for moderate pain. for pain   LORazepam 1 MG tablet Commonly known as:  ATIVAN Take 1 tablet (1 mg total) by mouth at bedtime as needed for anxiety.   LORazepam 0.5 MG tablet Commonly known as:  ATIVAN Take 0.5 mg by mouth at bedtime as needed for anxiety.   polyethylene glycol packet Commonly known as:  MIRALAX / GLYCOLAX Take 17 g by mouth daily.      Follow-up Information    Corine Shelter, MD. Schedule an appointment as soon as possible for a visit in 1 week(s).   Specialty:  Pulmonary Disease Why:  Hospital follow up  Contact information: [address] [place] [address] [phone]          No Known Allergies  Consultations:  None    Procedures/Studies: Dg Abdomen Acute W/chest  Result Date: 01/29/2018 CLINICAL DATA:  Generalized abdominal pain. Constipation for 3 days. EXAM: DG ABDOMEN ACUTE W/ 1V CHEST COMPARISON:  Radiograph  11/07/2017.  CT 11/04/2017 FINDINGS: The cardiomediastinal contours are normal. The lungs are clear. Unchanged heart size and mediastinal contours with aortic atherosclerosis. There is no free intra-abdominal air. No dilated bowel loops to suggest obstruction. Stools 10 the rectum spanning 7 cm. Small volume of colonic stool elsewhere. Right pelvic calcification likely correspond uterine calcifications, are unchanged dating back to 2016 and benign. No acute osseous abnormalities are seen. IMPRESSION: 1. Stool distended rectum raises concern for fecal impaction. Small volume of colonic stool elsewhere. No bowel obstruction. 2.  No acute pulmonary process. Electronically Signed   By: Rubye Oaks M.D.   On: 01/29/2018 00:02    Discharge Exam: Vitals:   01/29/18 0330 01/29/18 0415  BP: (!) 151/53 (!) 132/42  Pulse: 80 78  Resp:  15  Temp:  97.8 F (36.6 C)  SpO2: 100% 100%   Vitals:   01/29/18 0230 01/29/18 0300 01/29/18  0330 01/29/18 0415  BP: (!) 142/48 (!) 158/39 (!) 151/53 (!) 132/42  Pulse: 77 90 80 78  Resp:    15  Temp:    97.8 F (36.6 C)  TempSrc:    Oral  SpO2: 98% 97% 100% 100%  Weight:    62.7 kg (138 lb 3.7 oz)  Height:    5\' 3"  (1.6 m)    General: Pt is alert, awake, not in acute distress Cardiovascular: RRR, S1/S2 +, no rubs, no gallops Respiratory: CTA bilaterally, no wheezing, no rhonchi Abdominal: Soft, NT, ND, bowel sounds + Extremities: no edema, no cyanosis  The results of significant diagnostics from this hospitalization (including imaging, microbiology, ancillary and laboratory) are listed below for reference.     Microbiology: No results found for this or any previous visit (from the past 240 hour(s)).   Labs: BNP (last 3 results) No results for input(s): BNP in the last 8760 hours. Basic Metabolic Panel: Recent Labs  Lab 01/29/18 0006  NA 136  K 5.2*  CL 101  CO2 22  GLUCOSE 126*  BUN 42*  CREATININE 1.78*  CALCIUM 9.3   Liver Function  Tests: Recent Labs  Lab 01/29/18 0006  AST 32  ALT 17  ALKPHOS 59  BILITOT 0.9  PROT 7.3  ALBUMIN 4.1   Recent Labs  Lab 01/29/18 0006  LIPASE 24   No results for input(s): AMMONIA in the last 168 hours. CBC: Recent Labs  Lab 01/29/18 0218  WBC 8.4  NEUTROABS 6.5  HGB 13.0  HCT 39.5  MCV 92.9  PLT 283   Cardiac Enzymes: No results for input(s): CKTOTAL, CKMB, CKMBINDEX, TROPONINI in the last 168 hours. BNP: Invalid input(s): POCBNP CBG: No results for input(s): GLUCAP in the last 168 hours. D-Dimer No results for input(s): DDIMER in the last 72 hours. Hgb A1c No results for input(s): HGBA1C in the last 72 hours. Lipid Profile No results for input(s): CHOL, HDL, LDLCALC, TRIG, CHOLHDL, LDLDIRECT in the last 72 hours. Thyroid function studies No results for input(s): TSH, T4TOTAL, T3FREE, THYROIDAB in the last 72 hours.  Invalid input(s): FREET3 Anemia work up No results for input(s): VITAMINB12, FOLATE, FERRITIN, TIBC, IRON, RETICCTPCT in the last 72 hours. Urinalysis    Component Value Date/Time   COLORURINE YELLOW 11/07/2017 1423   APPEARANCEUR CLEAR 11/07/2017 1423   LABSPEC 1.014 11/07/2017 1423   PHURINE 6.0 11/07/2017 1423   GLUCOSEU NEGATIVE 11/07/2017 1423   HGBUR NEGATIVE 11/07/2017 1423   BILIRUBINUR NEGATIVE 11/07/2017 1423   KETONESUR NEGATIVE 11/07/2017 1423   PROTEINUR NEGATIVE 11/07/2017 1423   UROBILINOGEN 1.0 01/29/2017 1142   NITRITE NEGATIVE 11/07/2017 1423   LEUKOCYTESUR NEGATIVE 11/07/2017 1423   Sepsis Labs Invalid input(s): PROCALCITONIN,  WBC,  LACTICIDVEN Microbiology No results found for this or any previous visit (from the past 240 hour(s)).   Time coordinating discharge: 50 minutes  SIGNED:  Latrelle Dodrill, MD  [facility] 01/29/2018, 3:11 PM  Pager please text page via  [email or web address]  Note - This record has been created using [employer]. Chart creation errors have been sought, but may not always have  been located. Such creation errors do not reflect on the standard of medical care.

## 2018-01-29 NOTE — ED Notes (Signed)
Attempt x 2 unsuccessful sticks PIV-U/S IV placed

## 2018-01-29 NOTE — Progress Notes (Signed)
Rx Brief note: Lovenox  Wt=62 kg, CrCl ~16 ml/min  Rx adjusted Lovenox to 30 mg daily in pt with CrCl<30 ml/min  Thanks Lorenza EvangelistGreen, Thetis Schwimmer R 01/29/2018 5:06 AM

## 2018-09-02 ENCOUNTER — Inpatient Hospital Stay (HOSPITAL_COMMUNITY)
Admission: EM | Admit: 2018-09-02 | Discharge: 2018-09-05 | DRG: 024 | Disposition: A | Discharge status: Home Health | Payer: Medicare Other | Attending: Neurology | Admitting: Neurology

## 2018-09-02 ENCOUNTER — Emergency Department (HOSPITAL_COMMUNITY): Payer: Medicare Other

## 2018-09-02 ENCOUNTER — Encounter (HOSPITAL_COMMUNITY): Payer: Self-pay | Admitting: Anesthesiology

## 2018-09-02 ENCOUNTER — Emergency Department (HOSPITAL_COMMUNITY): Payer: Medicare Other | Admitting: Anesthesiology

## 2018-09-02 ENCOUNTER — Encounter (HOSPITAL_COMMUNITY)
Admission: EM | Disposition: A | Discharge status: Home Health | Payer: Self-pay | Source: Home / Self Care | Attending: Psychiatry

## 2018-09-02 DIAGNOSIS — I63312 Cerebral infarction due to thrombosis of left middle cerebral artery: Secondary | ICD-10-CM | POA: Diagnosis not present

## 2018-09-02 DIAGNOSIS — I429 Cardiomyopathy, unspecified: Secondary | ICD-10-CM | POA: Diagnosis present

## 2018-09-02 DIAGNOSIS — R531 Weakness: Secondary | ICD-10-CM | POA: Diagnosis present

## 2018-09-02 DIAGNOSIS — R29810 Facial weakness: Secondary | ICD-10-CM | POA: Diagnosis present

## 2018-09-02 DIAGNOSIS — R29729 NIHSS score 29: Secondary | ICD-10-CM | POA: Diagnosis present

## 2018-09-02 DIAGNOSIS — I700 Atherosclerosis of aorta: Secondary | ICD-10-CM | POA: Diagnosis present

## 2018-09-02 DIAGNOSIS — I6602 Occlusion and stenosis of left middle cerebral artery: Secondary | ICD-10-CM | POA: Insufficient documentation

## 2018-09-02 DIAGNOSIS — R4701 Aphasia: Secondary | ICD-10-CM | POA: Diagnosis present

## 2018-09-02 DIAGNOSIS — I672 Cerebral atherosclerosis: Secondary | ICD-10-CM | POA: Diagnosis present

## 2018-09-02 DIAGNOSIS — E785 Hyperlipidemia, unspecified: Secondary | ICD-10-CM | POA: Diagnosis present

## 2018-09-02 DIAGNOSIS — I10 Essential (primary) hypertension: Secondary | ICD-10-CM | POA: Diagnosis present

## 2018-09-02 DIAGNOSIS — G8191 Hemiplegia, unspecified affecting right dominant side: Secondary | ICD-10-CM | POA: Diagnosis present

## 2018-09-02 DIAGNOSIS — I63412 Cerebral infarction due to embolism of left middle cerebral artery: Secondary | ICD-10-CM | POA: Diagnosis present

## 2018-09-02 DIAGNOSIS — I639 Cerebral infarction, unspecified: Secondary | ICD-10-CM

## 2018-09-02 DIAGNOSIS — N183 Chronic kidney disease, stage 3 unspecified: Secondary | ICD-10-CM | POA: Diagnosis present

## 2018-09-02 DIAGNOSIS — K219 Gastro-esophageal reflux disease without esophagitis: Secondary | ICD-10-CM | POA: Diagnosis present

## 2018-09-02 DIAGNOSIS — Z79891 Long term (current) use of opiate analgesic: Secondary | ICD-10-CM | POA: Diagnosis not present

## 2018-09-02 DIAGNOSIS — H518 Other specified disorders of binocular movement: Secondary | ICD-10-CM | POA: Diagnosis present

## 2018-09-02 DIAGNOSIS — K115 Sialolithiasis: Secondary | ICD-10-CM | POA: Diagnosis present

## 2018-09-02 DIAGNOSIS — I129 Hypertensive chronic kidney disease with stage 1 through stage 4 chronic kidney disease, or unspecified chronic kidney disease: Secondary | ICD-10-CM | POA: Diagnosis present

## 2018-09-02 DIAGNOSIS — D62 Acute posthemorrhagic anemia: Secondary | ICD-10-CM | POA: Diagnosis present

## 2018-09-02 DIAGNOSIS — I739 Peripheral vascular disease, unspecified: Secondary | ICD-10-CM | POA: Diagnosis present

## 2018-09-02 DIAGNOSIS — Z79899 Other long term (current) drug therapy: Secondary | ICD-10-CM

## 2018-09-02 DIAGNOSIS — I6349 Cerebral infarction due to embolism of other cerebral artery: Secondary | ICD-10-CM | POA: Diagnosis not present

## 2018-09-02 HISTORY — PX: IR PERCUTANEOUS ART THROMBECTOMY/INFUSION INTRACRANIAL INC DIAG ANGIO: IMG6087

## 2018-09-02 HISTORY — PX: IR CT HEAD LTD: IMG2386

## 2018-09-02 HISTORY — PX: RADIOLOGY WITH ANESTHESIA: SHX6223

## 2018-09-02 LAB — DIFFERENTIAL
Abs Immature Granulocytes: 0.03 10*3/uL (ref 0.00–0.07)
Basophils Absolute: 0 10*3/uL (ref 0.0–0.1)
Basophils Relative: 1 %
Eosinophils Absolute: 0.1 10*3/uL (ref 0.0–0.5)
Eosinophils Relative: 3 %
Immature Granulocytes: 1 %
Lymphocytes Relative: 27 %
Lymphs Abs: 1.1 10*3/uL (ref 0.7–4.0)
Monocytes Absolute: 0.4 10*3/uL (ref 0.1–1.0)
Monocytes Relative: 11 %
Neutro Abs: 2.3 10*3/uL (ref 1.7–7.7)
Neutrophils Relative %: 57 %

## 2018-09-02 LAB — COMPREHENSIVE METABOLIC PANEL
ALT: 14 U/L (ref 0–44)
AST: 20 U/L (ref 15–41)
Albumin: 2.6 g/dL — ABNORMAL LOW (ref 3.5–5.0)
Alkaline Phosphatase: 38 U/L (ref 38–126)
Anion gap: 8 (ref 5–15)
BUN: 32 mg/dL — ABNORMAL HIGH (ref 8–23)
CO2: 20 mmol/L — ABNORMAL LOW (ref 22–32)
Calcium: 7.5 mg/dL — ABNORMAL LOW (ref 8.9–10.3)
Chloride: 107 mmol/L (ref 98–111)
Creatinine, Ser: 1.61 mg/dL — ABNORMAL HIGH (ref 0.44–1.00)
GFR calc Af Amer: 31 mL/min — ABNORMAL LOW (ref >60)
GFR calc non Af Amer: 27 mL/min — ABNORMAL LOW (ref >60)
Glucose, Bld: 113 mg/dL — ABNORMAL HIGH (ref 70–99)
Potassium: 4.4 mmol/L (ref 3.5–5.1)
Sodium: 135 mmol/L (ref 135–145)
Total Bilirubin: 0.5 mg/dL (ref 0.3–1.2)
Total Protein: 4.7 g/dL — ABNORMAL LOW (ref 6.5–8.1)

## 2018-09-02 LAB — PREPARE RBC (CROSSMATCH)

## 2018-09-02 LAB — CBC
HCT: 27.2 % — ABNORMAL LOW (ref 36.0–46.0)
Hemoglobin: 8.5 g/dL — ABNORMAL LOW (ref 12.0–15.0)
MCH: 29.7 pg (ref 26.0–34.0)
MCHC: 31.3 g/dL (ref 30.0–36.0)
MCV: 95.1 fL (ref 80.0–100.0)
Platelets: 230 10*3/uL (ref 150–400)
RBC: 2.86 MIL/uL — ABNORMAL LOW (ref 3.87–5.11)
RDW: 16.4 % — ABNORMAL HIGH (ref 11.5–15.5)
WBC: 4 10*3/uL (ref 4.0–10.5)
nRBC: 0 % (ref 0.0–0.2)

## 2018-09-02 LAB — APTT: aPTT: 24 seconds (ref 24–36)

## 2018-09-02 LAB — PROTIME-INR
INR: 1
Prothrombin Time: 13.1 seconds (ref 11.4–15.2)

## 2018-09-02 LAB — MRSA PCR SCREENING: MRSA BY PCR: NEGATIVE

## 2018-09-02 LAB — ABO/RH: ABO/RH(D): O POS

## 2018-09-02 SURGERY — IR WITH ANESTHESIA
Anesthesia: General

## 2018-09-02 MED ORDER — PANTOPRAZOLE SODIUM 40 MG IV SOLR
40.0000 mg | Freq: Every day | INTRAVENOUS | Status: DC
  Administered 2018-09-02 – 2018-09-03 (×2): 40 mg via INTRAVENOUS
  Filled 2018-09-02 (×2): qty 40

## 2018-09-02 MED ORDER — STROKE: EARLY STAGES OF RECOVERY BOOK
Freq: Once | Status: DC
  Filled 2018-09-02: qty 1

## 2018-09-02 MED ORDER — ROCURONIUM BROMIDE 10 MG/ML (PF) SYRINGE
PREFILLED_SYRINGE | INTRAVENOUS | Status: DC | PRN
  Administered 2018-09-02: 40 mg via INTRAVENOUS
  Administered 2018-09-02: 10 mg via INTRAVENOUS

## 2018-09-02 MED ORDER — CEFAZOLIN SODIUM-DEXTROSE 1-4 GM/50ML-% IV SOLN
INTRAVENOUS | Status: DC | PRN
  Administered 2018-09-02: 2 g via INTRAVENOUS

## 2018-09-02 MED ORDER — SODIUM CHLORIDE 0.9 % IV SOLN
INTRAVENOUS | Status: DC | PRN
  Administered 2018-09-02: 16:00:00 via INTRAVENOUS

## 2018-09-02 MED ORDER — ASPIRIN 325 MG PO TABS
ORAL_TABLET | ORAL | Status: AC
  Filled 2018-09-02: qty 1

## 2018-09-02 MED ORDER — ONDANSETRON HCL 4 MG/2ML IJ SOLN
INTRAMUSCULAR | Status: DC | PRN
  Administered 2018-09-02: 4 mg via INTRAVENOUS

## 2018-09-02 MED ORDER — SODIUM CHLORIDE 0.9 % IV SOLN
INTRAVENOUS | Status: DC
  Administered 2018-09-03 – 2018-09-04 (×2): via INTRAVENOUS

## 2018-09-02 MED ORDER — CLEVIDIPINE BUTYRATE 0.5 MG/ML IV EMUL
INTRAVENOUS | Status: AC
  Filled 2018-09-02: qty 50

## 2018-09-02 MED ORDER — CLEVIDIPINE BUTYRATE 0.5 MG/ML IV EMUL
0.0000 mg/h | INTRAVENOUS | Status: AC
  Administered 2018-09-02: 6 mg/h via INTRAVENOUS
  Administered 2018-09-02: 1 mg/h via INTRAVENOUS
  Administered 2018-09-03 (×3): 15 mg/h via INTRAVENOUS
  Filled 2018-09-02: qty 50
  Filled 2018-09-02: qty 100
  Filled 2018-09-02: qty 50

## 2018-09-02 MED ORDER — SODIUM CHLORIDE 0.9 % IV SOLN
INTRAVENOUS | Status: DC | PRN
  Administered 2018-09-02: 40 ug/min via INTRAVENOUS

## 2018-09-02 MED ORDER — IOPAMIDOL (ISOVUE-300) INJECTION 61%
INTRAVENOUS | Status: AC
  Filled 2018-09-02: qty 200

## 2018-09-02 MED ORDER — ACETAMINOPHEN 160 MG/5ML PO SOLN: 650.0000 mg | ORAL | Status: DC | PRN

## 2018-09-02 MED ORDER — LIDOCAINE HCL 1 % IJ SOLN
INTRAMUSCULAR | Status: AC
  Filled 2018-09-02: qty 20

## 2018-09-02 MED ORDER — TICAGRELOR 90 MG PO TABS
ORAL_TABLET | ORAL | Status: AC
  Filled 2018-09-02: qty 2

## 2018-09-02 MED ORDER — SODIUM CHLORIDE 0.9% FLUSH: 3.0000 mL | Freq: Once | INTRAVENOUS | Status: DC

## 2018-09-02 MED ORDER — GLYCOPYRROLATE PF 0.2 MG/ML IJ SOSY
PREFILLED_SYRINGE | INTRAMUSCULAR | Status: DC | PRN
  Administered 2018-09-02: .2 mg via INTRAVENOUS

## 2018-09-02 MED ORDER — SUGAMMADEX SODIUM 200 MG/2ML IV SOLN
INTRAVENOUS | Status: DC | PRN
  Administered 2018-09-02: 400 mg via INTRAVENOUS

## 2018-09-02 MED ORDER — IOPAMIDOL (ISOVUE-370) INJECTION 76%
75.0000 mL | Freq: Once | INTRAVENOUS | Status: AC | PRN
  Administered 2018-09-02: 75 mL via INTRAVENOUS

## 2018-09-02 MED ORDER — SODIUM CHLORIDE 0.9 % IV SOLN
INTRAVENOUS | Status: AC
  Administered 2018-09-02 – 2018-09-03 (×2): via INTRAVENOUS

## 2018-09-02 MED ORDER — ACETAMINOPHEN 650 MG RE SUPP: 650.0000 mg | RECTAL | Status: DC | PRN

## 2018-09-02 MED ORDER — ETOMIDATE 2 MG/ML IV SOLN
INTRAVENOUS | Status: DC | PRN
  Administered 2018-09-02: 9 mg via INTRAVENOUS

## 2018-09-02 MED ORDER — ACETAMINOPHEN 325 MG PO TABS: 650.0000 mg | ORAL_TABLET | ORAL | Status: DC | PRN

## 2018-09-02 MED ORDER — IOHEXOL 300 MG/ML  SOLN
150.0000 mL | Freq: Once | INTRAMUSCULAR | Status: AC | PRN
  Administered 2018-09-02: 60 mL via INTRA_ARTERIAL

## 2018-09-02 MED ORDER — NITROGLYCERIN 1 MG/10 ML FOR IR/CATH LAB
INTRA_ARTERIAL | Status: AC
  Filled 2018-09-02: qty 10

## 2018-09-02 MED ORDER — SODIUM CHLORIDE 0.9% IV SOLUTION: Freq: Once | INTRAVENOUS | Status: DC

## 2018-09-02 MED ORDER — SODIUM CHLORIDE (PF) 0.9 % IJ SOLN
INTRAVENOUS | Status: AC | PRN
  Administered 2018-09-02 (×3): 25 ug via INTRA_ARTERIAL

## 2018-09-02 MED ORDER — EPTIFIBATIDE 20 MG/10ML IV SOLN
INTRAVENOUS | Status: AC
  Filled 2018-09-02: qty 10

## 2018-09-02 MED ORDER — TIROFIBAN HCL IN NACL 5-0.9 MG/100ML-% IV SOLN
INTRAVENOUS | Status: AC
  Filled 2018-09-02: qty 100

## 2018-09-02 MED ORDER — FENTANYL CITRATE (PF) 100 MCG/2ML IJ SOLN
25.0000 ug | Freq: Once | INTRAMUSCULAR | Status: AC
  Administered 2018-09-02: 25 ug via INTRAVENOUS
  Filled 2018-09-02: qty 2

## 2018-09-02 MED ORDER — ALTEPLASE (STROKE) FULL DOSE INFUSION
0.9000 mg/kg | Freq: Once | INTRAVENOUS | Status: AC
  Administered 2018-09-02: 50.9 mg via INTRAVENOUS
  Filled 2018-09-02: qty 100

## 2018-09-02 MED ORDER — CEFAZOLIN SODIUM-DEXTROSE 2-4 GM/100ML-% IV SOLN
INTRAVENOUS | Status: AC
  Filled 2018-09-02: qty 100

## 2018-09-02 MED ORDER — DEXAMETHASONE SODIUM PHOSPHATE 10 MG/ML IJ SOLN
INTRAMUSCULAR | Status: DC | PRN
  Administered 2018-09-02: 4 mg via INTRAVENOUS

## 2018-09-02 NOTE — Progress Notes (Signed)
Patient ID: [name], female   DOB: 11-28-1920, 85 y.o.   MRN: [id number] INR. 89 [facility]  Modified RSS 1. LSW 14.55hrs. Sudden onset of lt gaze deviation,RT sided weakness and aphasia. CT brain No ICH ASPECTS 10 CTA occluded distal LT MCA M1 seg. IVTPA given. Given patients modified rankin score of 1,and Ct findings with an NIHSS of 29 Option of endovascular revascularization of occluded Lt MCA D/W  Patients caregiver who is also her POA. Procedure,reasons,risks,esp of ICH  Of 10 % or more given her age,fully discussed with her POA. Potential complications of vascular injury and inability to revascularize also reviewed. Questions answered to POAs satisfaction who provided witnessed consent for the procedure.Marland Kitchen S.Laurey Salser MD

## 2018-09-02 NOTE — Code Documentation (Signed)
85 yo Female coming from home with complaints of sudden onset of right sided weakness, left gaze, and inability to speak that started at 1455 when patient was with her caregiver. Pt lives alone and has someone check on her daily. She completes her ADLs on her own and walks with cane per family. Initial NIHSS 29 due to patient being stupor with left gaze, right field cut, right facial droop, right arm flaccid, right leg flaccid, decreased sensation to the right, inability to follow commands or answer questions. CT Head completed and no hemorrhage noted. Pt is a tPA candidate. Two PIV started before patient could receive tPA. tPA started at 1549 with 5.1 mg bolus given over one minute followed by 45.8 mg over 60 minutes. CTA completed and showed distal M1 occlusion. Family at the bedside and consented for IR. Agreed for patient to be taken to IR. IR aware and patient arrived In IR suite at 1607. Pt transferred to IR care. Care Handoff given to CRNA and IR RN.

## 2018-09-02 NOTE — Progress Notes (Signed)
Patient ID: [name], female   DOB: 1921-04-16, 85 y.o.   MRN: [id number] INR. Post procedure CT no ICH or mass effect seen. RT groin sheath in situ with bilaterally dopplerable pulses. Patient extubated . Maintaining  )2 sats. Moving all 4s, L.R with no facial asymmetry. Has difficulty with expressing. S.Mechille Varghese MD

## 2018-09-02 NOTE — ED Triage Notes (Signed)
Pt arrives with [facility]; CODE STROKE initiated. LKN 1455.

## 2018-09-02 NOTE — Transfer of Care (Signed)
Immediate Anesthesia Transfer of Care Note  Patient: [name]  Procedure(s) Performed: IR WITH ANESTHESIA (N/A )  Patient Location: PACU  Anesthesia Type:General  Level of Consciousness: awake, patient cooperative and responds to stimulation  Airway & Oxygen Therapy: Patient Spontanous Breathing and Patient connected to nasal cannula oxygen  Post-op Assessment: Report given to RN, Post -op Vital signs reviewed and stable and Patient moving all extremities  Post vital signs: Reviewed and stable  Last Vitals:  Vitals Value Taken Time  BP 114/47 09/02/2018  6:11 PM  Temp    Pulse 67 09/02/2018  6:16 PM  Resp 17 09/02/2018  6:16 PM  SpO2 100 % 09/02/2018  6:16 PM  Vitals shown include unvalidated device data.  Last Pain: There were no vitals filed for this visit.       Complications: No apparent anesthesia complications

## 2018-09-02 NOTE — Progress Notes (Signed)
2330 IR tech and Dr. Corliss Skains at bedside.  They will exoseal the sheath and hold pressure

## 2018-09-02 NOTE — Procedures (Signed)
S/P Lt common carotid arteriogram followed by complete revascularization of occluded dominant inferior division with x 1 pass with embotrap x 33 mm retriever  device  Achieving a TICI 3 revascularization.

## 2018-09-02 NOTE — Progress Notes (Signed)
2149 pt dressing is not saturated, RN began to hold pressure. Deveshwar informed, instruction to maintain pressure. Check sheath to determine if it is all the way in.  After 15 min of presure the dressing was removed and the sheath has aprox 1-1.5 in exposed. Deveshwar aware and this RN was then told to either advance the sheath or call a 2H RN  To either advance the sheath or remove it.  2H RN are unwilling to d/c sheath due to pt receiving tPA. I was told to call cath lab, they are unable to pull this as well.   Deveshwar informed and I was informed to call the IR tech. IR tech is also against pulling the sheath. She said she would contact Deveshwar and speak with him personally. In the meantime pressure continues to be held on the groin site. Pulses are still dopplerable.

## 2018-09-02 NOTE — Anesthesia Postprocedure Evaluation (Signed)
Anesthesia Post Note  Patient: Leslie HalsBernice F Trickel  Procedure(s) Performed: IR WITH ANESTHESIA (N/A )     Patient location during evaluation: PACU Anesthesia Type: General Level of consciousness: lethargic, confused and patient cooperative Pain management: pain level controlled Vital Signs Assessment: post-procedure vital signs reviewed and stable Cardiovascular status: blood pressure returned to baseline Anesthetic complications: no    Last Vitals:  Vitals:   09/02/18 2030 09/02/18 2045  BP: (!) 101/48 (!) 112/38  Pulse: 73 84  Resp: 15 15  Temp:    SpO2: 96% 99%    Last Pain:  Vitals:   09/02/18 2011  TempSrc: Oral  PainSc:                  Jontrell Bushong COKER

## 2018-09-02 NOTE — Anesthesia Procedure Notes (Signed)
Procedure Name: Intubation Date/Time: 09/02/2018 4:37 PM Performed by: Myna Bright, CRNA Pre-anesthesia Checklist: Patient identified, Emergency Drugs available, Suction available and Patient being monitored Patient Re-evaluated:Patient Re-evaluated prior to induction Oxygen Delivery Method: Circle system utilized Preoxygenation: Pre-oxygenation with 100% oxygen Induction Type: IV induction Laryngoscope Size: Mac and 3 Grade View: Grade II Tube type: Oral Tube size: 7.0 mm Number of attempts: 1 Airway Equipment and Method: Stylet Placement Confirmation: ETT inserted through vocal cords under direct vision,  positive ETCO2 and breath sounds checked- equal and bilateral Secured at: 21 cm Tube secured with: Tape Dental Injury: Teeth and Oropharynx as per pre-operative assessment

## 2018-09-02 NOTE — Progress Notes (Signed)
Pharmacist Code Stroke Response  Notified to mix tPA at 1540 by Dr. Laurence Slate Delivered tPA to RN at 1543  tPA dose = 5.1 mg bolus over 1 minute followed by 45.8 mg for a total dose of 50.9 mg over 1 hour  Issues/delays encountered (if applicable): Trying to get IV access before initiating tPA  Enzo Bi, PharmD, BCPS, [facility] Clinical Pharmacist Phone number [phone] 09/02/2018 3:53 PM

## 2018-09-02 NOTE — H&P (Signed)
Chief Complaint: Aphasia   History obtained from: Patient and Chart     HPI:                                                                                                                                       [name] is an 85 y.o. female.  With past medical history of cardiomyopathy presents to the emergency room as a code stroke by EMS after home health nurse noticed patient suddenly stopped speaking and slumped over.   Last seen normal was 12:55 PM, when patient was with her home health nurse.  She was talking and suddenly became nonverbal, developed right facial droop.  When EMS arrived patient was noted to have left gaze deviation and right-sided hemiplegia.  Blood pressure was 120's systolic.  [facility], she was taken for stat CT head which showed no hemorrhage.  Patient received IV TPA.  CT angiogram was performed and showed a left M2 occlusion and after discussion with power of attorney regarding risk versus benefit of procedure patient was taken to IR for mechanical thrombectomy.  At baseline, patient is independent and lives by herself at home with home health nurse visiting daily.  She has no history of dementia, walks with a cane.  Date last known well: 2.11.20 Time last known well: 12.55 pm tPA Given: yes NIHSS: 29 Baseline MRS 1    Past Medical History:  Diagnosis Date  . Acid reflux   . Bladder disease   . Cardiomyopathy   . Constipation   . Diverticulosis   . Interstitial cystitis   . Renal disorder     Past Surgical History:  Procedure Laterality Date  . HERNIA REPAIR      Family History  Family history unknown: Yes   Social History:  reports that she has never smoked. She has never used smokeless tobacco. She reports that she does not drink alcohol or use drugs.  Allergies: No Known Allergies  Medications:                                                                                                                         I reviewed home medications   ROS:  Unable to obtain due to aphasia    Examination:                                                                                                      General: Appears well-developed  Psych: Affect appropriate to situation Eyes: No scleral injection HENT: No OP obstrucion Head: Normocephalic.  Cardiovascular: Normal rate and regular rhythm.  Respiratory: Effort normal and breath sounds normal to anterior ascultation GI: Soft.  No distension. There is no tenderness.  Skin: WDI    Neurological Examination Mental Status: Stuporous, globally aphasic and not following any commands Cranial Nerves: II: Visual fields : Right homonymous hemianopsia III,IV, VI: ptosis not present, forced gaze deviation to the left, pupils equal, round, reactive to light and accommodation VII: Right facial droop XII: midline tongue extension Motor: Right : Upper extremity   0/5    Left:     Upper extremity   4+/5  Lower extremity   0/5     Lower extremity   4/5 Tone and bulk:normal tone throughout; no atrophy noted Sensory: No grimace to noxious stimulus on the right leg or arm, likely has neglect Deep Tendon Reflexes: 2+ and symmetric throughout Plantars: Right: downgoing   Left: downgoing Cerebellar: Unable to assess Gait: Unable to assess     Lab Results: Basic Metabolic Panel: No results for input(s): NA, K, CL, CO2, GLUCOSE, BUN, CREATININE, CALCIUM, MG, PHOS in the last 168 hours.  CBC: No results for input(s): WBC, NEUTROABS, HGB, HCT, MCV, PLT in the last 168 hours.  Coagulation Studies: No results for input(s): LABPROT, INR in the last 72 hours.  Imaging: Ct Head Code Stroke Wo Contrast  Result Date: 09/02/2018 CLINICAL DATA:  Code stroke. Last seen normal 1455 hours. Acute speech disturbance and  right-sided weakness. EXAM: CT HEAD WITHOUT CONTRAST TECHNIQUE: Contiguous axial images were obtained from the base of the skull through the vertex without intravenous contrast. COMPARISON:  10/03/2013 FINDINGS: Brain: Chronic small-vessel ischemic changes affect the pons, with wall air in degeneration on the left. No focal cerebellar insult. Old infarction in the left PCA territory affecting the posteromedial temporal lobe and occipital lobe. Chronic small-vessel ischemic changes affect the thalami and basal ganglia regions. I do not see evidence of acute infarction, mass lesion, hemorrhage, hydrocephalus or extra-axial collection. Vascular: There is atherosclerotic calcification of the major vessels at the base of the brain. Skull: Negative Sinuses/Orbits: Clear/normal Other: None ASPECTS (Alberta Stroke Program Early CT Score) - Ganglionic level infarction (caudate, lentiform nuclei, internal capsule, insula, M1-M3 cortex): 7 - Supraganglionic infarction (M4-M6 cortex): 3 Total score (0-10 with 10 being normal): 10 IMPRESSION: 1. No acute finding by CT. Extensive chronic small-vessel ischemic changes throughout the brain. Old left PCA territory infarction. 2. ASPECTS is 10. 3. These results were communicated to Dr. Laurence Slate at 3:47 pmon 2/11/2020by text page via the [facility] messaging system. Electronically Signed   By: Paulina Fusi M.D.   On: 09/02/2018 15:51     ASSESSMENT AND PLAN   85 year old female presents with aphasia and right hemiplegia secondary to left  MCA stroke caused by M2 occlusion.  Patient received IV TPA and was taken to IR for emergent mechanical thrombectomy.  Acute left MCA stroke status post IV TPA and emergent mechanical thrombectomy with TICI 3 recanalization  Plan # MRI of the brain without contrast # CT head in 24 hours #Transthoracic Echo  #No aspirin/anticoagulation after 24 hours and after CT head negative for hemorrhage #Start Atorvastatin 40 mg when she passes swallow  evaluation # BP goal: 120-140 systolic BP # HBAIC and Lipid profile # Telemetry monitoring # Frequent neuro checks #  stroke swallow screen  Cardiomyopathy -Gentle IV fluids -Avoid volume overload  CODE STATUS: Limited DNR, ok to intubate DVT PPX; SCD  Please page stroke NP  Or  PA  Or MD from 8am -4 pm  as this patient from this time will be  followed by the stroke.   You can look them up on [email or web address]  Password [email or web address]    This patient is neurologically critically ill due to MCA stroke status post mechanical thrombectomy and IV TPA. She is at risk for significant risk of neurological worsening from cerebral edema,  death from brain herniation, heart failure, hemorrhagic conversion, infection, respiratory failure and seizure. This patient's care requires constant monitoring of vital signs, hemodynamics, respiratory and cardiac monitoring, review of multiple databases, neurological assessment, discussion with family, other specialists and medical decision making of high complexity.  I spent 60 minutes of neurocritical time in the care of this patient.     Sushanth Aroor [facility] Pager Number [phone]

## 2018-09-02 NOTE — Anesthesia Preprocedure Evaluation (Signed)
Anesthesia Evaluation  Patient identified by MRN, date of birth, ID band  Reviewed: NPO status , Unable to perform ROS - Chart review onlyPreop documentation limited or incomplete due to emergent nature of procedure.  Airway Mallampati: III  TM Distance: >3 FB Neck ROM: Full    Dental  (+) Poor Dentition, Chipped, Dental Advisory Given   Pulmonary    Pulmonary exam normal        Cardiovascular hypertension, Normal cardiovascular exam Rhythm:Regular Rate:Normal     Neuro/Psych CVA, Residual Symptoms    GI/Hepatic   Endo/Other    Renal/GU K+ 4.3     Musculoskeletal   Abdominal   Peds  Hematology Hgb 8.5   Anesthesia Other Findings   Reproductive/Obstetrics                             Anesthesia Physical Anesthesia Plan  ASA: IV and emergent  Anesthesia Plan: General   Post-op Pain Management:    Induction: Intravenous, Cricoid pressure planned and Rapid sequence  PONV Risk Score and Plan: 3 and Treatment may vary due to age or medical condition  Airway Management Planned: Oral ETT  Additional Equipment:   Intra-op Plan:   Post-operative Plan: Possible Post-op intubation/ventilation  Informed Consent:     Only emergency history available  Plan Discussed with: Surgeon, Anesthesiologist and CRNA  Anesthesia Plan Comments:         Anesthesia Quick Evaluation

## 2018-09-02 NOTE — Progress Notes (Signed)
Patient has noticeable bloody shadowing to right femoral site.  Bloody drainage has increased significantly since last assessment.  Dr. Corliss Skains was notified and gave orders to take down dressing, check sheath placement and hold pressure for 15 minutes.  This will be done prior to transfer to 4N31.  Patient has strong doppler signals to DP and PT sites, no hematoma present.

## 2018-09-02 NOTE — Progress Notes (Signed)
On arrival to PACU, patient's rhythm showed ST elevation.  Dr. Noreene Larsson was notified, no orders at this time. MD will review patient's prior EKG.  Also received orders from Dr. Noreene Larsson that patient did not need previously ordered 1 unit PRBC's.  Hemoglobin stable at 8.5.

## 2018-09-03 ENCOUNTER — Other Ambulatory Visit (HOSPITAL_COMMUNITY): Payer: Medicare Other

## 2018-09-03 ENCOUNTER — Encounter (HOSPITAL_COMMUNITY): Payer: Self-pay | Admitting: Interventional Radiology

## 2018-09-03 ENCOUNTER — Inpatient Hospital Stay (HOSPITAL_COMMUNITY): Payer: Medicare Other

## 2018-09-03 DIAGNOSIS — I63312 Cerebral infarction due to thrombosis of left middle cerebral artery: Secondary | ICD-10-CM

## 2018-09-03 LAB — CBC WITH DIFFERENTIAL/PLATELET
Abs Immature Granulocytes: 0.12 10*3/uL — ABNORMAL HIGH (ref 0.00–0.07)
Basophils Absolute: 0 10*3/uL (ref 0.0–0.1)
Basophils Relative: 0 %
Eosinophils Absolute: 0 10*3/uL (ref 0.0–0.5)
Eosinophils Relative: 0 %
HCT: 27.8 % — ABNORMAL LOW (ref 36.0–46.0)
Hemoglobin: 8.8 g/dL — ABNORMAL LOW (ref 12.0–15.0)
Immature Granulocytes: 1 %
Lymphocytes Relative: 6 %
Lymphs Abs: 0.8 10*3/uL (ref 0.7–4.0)
MCH: 30.7 pg (ref 26.0–34.0)
MCHC: 31.7 g/dL (ref 30.0–36.0)
MCV: 96.9 fL (ref 80.0–100.0)
Monocytes Absolute: 0.7 10*3/uL (ref 0.1–1.0)
Monocytes Relative: 6 %
Neutro Abs: 11.4 10*3/uL — ABNORMAL HIGH (ref 1.7–7.7)
Neutrophils Relative %: 87 %
Platelets: 278 10*3/uL (ref 150–400)
RBC: 2.87 MIL/uL — AB (ref 3.87–5.11)
RDW: 16.8 % — ABNORMAL HIGH (ref 11.5–15.5)
WBC: 13 10*3/uL — ABNORMAL HIGH (ref 4.0–10.5)
nRBC: 0 % (ref 0.0–0.2)

## 2018-09-03 LAB — HEMOGLOBIN A1C
Hgb A1c MFr Bld: 5.1 % (ref 4.8–5.6)
MEAN PLASMA GLUCOSE: 99.67 mg/dL

## 2018-09-03 LAB — LIPID PANEL
Cholesterol: 176 mg/dL (ref 0–200)
HDL: 38 mg/dL — ABNORMAL LOW (ref >40)
LDL Cholesterol: 95 mg/dL (ref 0–99)
Total CHOL/HDL Ratio: 4.6 RATIO
Triglycerides: 215 mg/dL — ABNORMAL HIGH (ref <150)
VLDL: 43 mg/dL — ABNORMAL HIGH (ref 0–40)

## 2018-09-03 LAB — BASIC METABOLIC PANEL
Anion gap: 9 (ref 5–15)
BUN: 33 mg/dL — ABNORMAL HIGH (ref 8–23)
CO2: 19 mmol/L — ABNORMAL LOW (ref 22–32)
CREATININE: 1.94 mg/dL — AB (ref 0.44–1.00)
Calcium: 7.9 mg/dL — ABNORMAL LOW (ref 8.9–10.3)
Chloride: 106 mmol/L (ref 98–111)
GFR calc Af Amer: 25 mL/min — ABNORMAL LOW (ref >60)
GFR calc non Af Amer: 21 mL/min — ABNORMAL LOW (ref >60)
Glucose, Bld: 126 mg/dL — ABNORMAL HIGH (ref 70–99)
Potassium: 4.4 mmol/L (ref 3.5–5.1)
Sodium: 134 mmol/L — ABNORMAL LOW (ref 135–145)

## 2018-09-03 MED ORDER — ASPIRIN 81 MG PO CHEW: 81.0000 mg | CHEWABLE_TABLET | Freq: Every day | ORAL | Status: DC

## 2018-09-03 MED ORDER — LORAZEPAM 0.5 MG PO TABS
0.5000 mg | ORAL_TABLET | Freq: Three times a day (TID) | ORAL | Status: DC | PRN
  Administered 2018-09-04: 0.5 mg via ORAL
  Filled 2018-09-03 (×2): qty 1

## 2018-09-03 MED ORDER — SODIUM CHLORIDE 0.9 % IV BOLUS
500.0000 mL | Freq: Once | INTRAVENOUS | Status: AC
  Administered 2018-09-03: 500 mL via INTRAVENOUS

## 2018-09-03 MED ORDER — ASPIRIN 325 MG PO TABS
325.0000 mg | ORAL_TABLET | Freq: Every day | ORAL | Status: DC
  Administered 2018-09-03 – 2018-09-04 (×2): 325 mg via ORAL
  Filled 2018-09-03 (×2): qty 1

## 2018-09-03 MED ORDER — ORAL CARE MOUTH RINSE
15.0000 mL | Freq: Two times a day (BID) | OROMUCOSAL | Status: DC
  Administered 2018-09-03 – 2018-09-05 (×4): 15 mL via OROMUCOSAL

## 2018-09-03 MED ORDER — ASPIRIN 81 MG PO CHEW: 81.0000 mg | CHEWABLE_TABLET | Freq: Once | ORAL | Status: DC

## 2018-09-03 MED ORDER — HYDROCODONE-ACETAMINOPHEN 5-325 MG PO TABS
1.0000 | ORAL_TABLET | ORAL | Status: DC | PRN
  Administered 2018-09-03 – 2018-09-05 (×7): 1 via ORAL
  Filled 2018-09-03 (×8): qty 1

## 2018-09-03 NOTE — Evaluation (Signed)
Physical Therapy Evaluation Patient Details Name: [name] MRN: [id number] DOB: 03/11/1921 Today's Date: 09/03/2018   History of Present Illness  Pt is a 85 year old female presents with aphasia and right hemiplegia secondary to left MCA stroke caused by M2 occlusion- per neurology.  Patient received IV TPA and was taken to IR for emergent mechanical thrombectomy on 09/02/18.  PMH: Cardiomyopathy, hernia repair.   Clinical Impression  Pt doing very well. Pt alert, oriented and able to follow all commands. OOB mobility limited by onset of lightheadedness when standing and her requesting to sit. Pt's BP 114/47, RN notified. Suspect pt to progress well and be able to go home with HHPT and 24/7 assist once medically stable. Acute PT to cont to follow.    Follow Up Recommendations Home health PT;Supervision/Assistance - 24 hour    Equipment Recommendations  None recommended by PT(has RW and shower seat)    Recommendations for Other Services       Precautions / Restrictions Precautions Precautions: Fall Precaution Comments: HOH Restrictions Weight Bearing Restrictions: No      Mobility  Bed Mobility Overal bed mobility: Needs Assistance Bed Mobility: Supine to Sit     Supine to sit: Min assist;HOB elevated     General bed mobility comments: min assist to scoot forward to EOB, increased time and effort   Transfers Overall transfer level: Needs assistance Equipment used: 2 person hand held assist Transfers: Sit to/from [employer] Sit to Stand: Min assist;+2 physical assistance;+2 safety/equipment Stand pivot transfers: Min assist;+2 physical assistance;+2 safety/equipment       General transfer comment: min assist to power up and steady in standing, limited tolerance due to onset of lightheadedness and desire to sit down. BP 114/47, RN aware  Ambulation/Gait             General Gait Details: limited today due to dizziness  Stairs             Wheelchair Mobility    Modified Rankin (Stroke Patients Only) Modified Rankin (Stroke Patients Only) Pre-Morbid Rankin Score: No symptoms Modified Rankin: Moderate disability     Balance Overall balance assessment: Needs assistance Sitting-balance support: No upper extremity supported;Feet supported Sitting balance-Leahy Scale: Fair Sitting balance - Comments: limited dynamic balance; supervision statically    Standing balance support: Bilateral upper extremity supported;During functional activity Standing balance-Leahy Scale: Poor Standing balance comment: reliant on B UE and external support                             Pertinent Vitals/Pain Pain Assessment: No/denies pain    Home Living Family/patient expects to be discharged to:: Private residence Living Arrangements: Alone(has assist 12 hrs a day ) Available Help at Discharge: Personal care attendant;Family;Available 24 hours/day Type of Home: House Home Access: Stairs to enter Entrance Stairs-Rails: Right Entrance Stairs-Number of Steps: 4 Home Layout: One level Home Equipment: Walker - 2 wheels;Cane - single point;Shower seat Additional Comments: reports aide assists with making food but she completes all other ADLs, aide also helps her get ready for church    Prior Function Level of Independence: Independent         Comments: uses both cane and RW but cane mostly, doesn't drive     Hand Dominance   Dominant Hand: Right    Extremity/Trunk Assessment   Upper Extremity Assessment Upper Extremity Assessment: Defer to OT evaluation    Lower Extremity Assessment Lower Extremity Assessment: Generalized  weakness(appropriate for age)    Cervical / Trunk Assessment Cervical / Trunk Assessment: Kyphotic  Communication   Communication: No difficulties  Cognition Arousal/Alertness: Awake/alert Behavior During Therapy: WFL for tasks assessed/performed Overall Cognitive Status: Within  Functional Limits for tasks assessed                                        General Comments General comments (skin integrity, edema, etc.): VSS, family present stating they can provide 24/7 assist    Exercises     Assessment/Plan    PT Assessment Patient needs continued PT services  PT Problem List Decreased strength;Decreased activity tolerance;Decreased balance;Decreased mobility;Decreased knowledge of use of DME       PT Treatment Interventions DME instruction;Gait training;Stair training;Functional mobility training;Therapeutic activities;Therapeutic exercise;Balance training;Neuromuscular re-education;Cognitive remediation;Patient/family education    PT Goals (Current goals can be found in the Care Plan section)  Acute Rehab PT Goals Patient Stated Goal: to get home  PT Goal Formulation: With patient/family Time For Goal Achievement: 09/17/18 Potential to Achieve Goals: Good    Frequency Min 4X/week   Barriers to discharge Decreased caregiver support has 12 hours of support, family states they can figure out 24/7    Co-evaluation PT/OT/SLP Co-Evaluation/Treatment: Yes Reason for Co-Treatment: Complexity of the patient's impairments (multi-system involvement) PT goals addressed during session: Mobility/safety with mobility OT goals addressed during session: ADL's and self-care;Other (comment)(mobility)       AM-PAC PT "6 Clicks" Mobility  Outcome Measure Help needed turning from your back to your side while in a flat bed without using bedrails?: A Little Help needed moving from lying on your back to sitting on the side of a flat bed without using bedrails?: A Little Help needed moving to and from a bed to a chair (including a wheelchair)?: A Little Help needed standing up from a chair using your arms (e.g., wheelchair or bedside chair)?: A Little Help needed to walk in hospital room?: A Little Help needed climbing 3-5 steps with a railing? : A  Lot 6 Click Score: 17    End of Session Equipment Utilized During Treatment: Gait belt Activity Tolerance: (limited by onset of dizziness) Patient left: in chair;with chair alarm set;with family/visitor present Nurse Communication: Mobility status PT Visit Diagnosis: Difficulty in walking, not elsewhere classified (R26.2)    Time: 0737-1062 PT Time Calculation (min) (ACUTE ONLY): 24 min   Charges:   PT Evaluation $PT Eval Moderate Complexity: 1 Mod          Lewis Shock, PT, DPT Acute Rehabilitation Services Pager #: [phone] Office #: [phone]   Iona Hansen 09/03/2018, 2:24 PM

## 2018-09-03 NOTE — Progress Notes (Signed)
Pt with MAP <65, but per documentation has been running MAP 50-low 60s since admit.  Dr. Wilford Corner notified, small bolus ordered.  Home ativan ordered.  Will continue to monitor.

## 2018-09-03 NOTE — Evaluation (Signed)
Clinical/Bedside Swallow Evaluation Patient Details  Name: [name] MRN: [id number] Date of Birth: Mar 17, 1921  Today's Date: 09/03/2018 Time: SLP Start Time (ACUTE ONLY): 1355 SLP Stop Time (ACUTE ONLY): 1420 SLP Time Calculation (min) (ACUTE ONLY): 25 min  Past Medical History:  Past Medical History:  Diagnosis Date  . Acid reflux   . Bladder disease   . Cardiomyopathy   . Constipation   . Diverticulosis   . Interstitial cystitis   . Renal disorder    Past Surgical History:  Past Surgical History:  Procedure Laterality Date  . HERNIA REPAIR    . RADIOLOGY WITH ANESTHESIA N/A 09/02/2018   Procedure: IR WITH ANESTHESIA;  Surgeon: Julieanne Cottoneveshwar, Sanjeev, MD;  Location: [facility] OR;  Service: Radiology;  Laterality: N/A;   HPI:  Pt is an 85 y.o. female with past medical history of cardiomyopathy presents to the emergency room as a code stroke by EMS after home health nurse noticed patient suddenly stopped speaking and slumped over. CT of the head was negative for hemorrhage but showed old left PCA territory infarction. Patient received IV TPA and IR was completed on 09/02/18   Assessment / Plan / Recommendation Clinical Impression  [name] and caregiver were present and denied observance of any aigns/symptoms of aspiration with with p.o. intake prior to admission. [name] reported that the pt avoids very hard foods due to limited dentition and sometimes has her meats cut up. However, the pt expressed that she does not like her meats to be cut up and does not prefer meats. Oral mechanism exam was within functional limits. She tolerated all solids and liquids without overt s/sx of aspiration. Mastication time was mildly increased due to dentition but functional. She denied the sensation of pharyngeal residue with any consistencies and no symptoms were noted thereof. SLP will follow to assess tolerance of the recommended diet.  SLP Visit Diagnosis: Dysphagia, unspecified (R13.10)   Aspiration Risk  No limitations    Diet Recommendation Dysphagia 3 (Mech soft);Thin liquid   Liquid Administration via: Cup;Straw Medication Administration: Whole meds with puree Supervision: Staff to assist with self feeding Compensations: Small sips/bites Postural Changes: Seated upright at 90 degrees;Remain upright for at least 30 minutes after po intake    Other  Recommendations Oral Care Recommendations: Oral care BID   Follow up Recommendations Home health SLP;24 hour supervision/assistance      Frequency and Duration min 2x/week  2 weeks       Prognosis Prognosis for Safe Diet Advancement: Good      Swallow Study   General Date of Onset: 09/02/18 HPI: Pt is an 85 y.o. female with past medical history of cardiomyopathy presents to the emergency room as a code stroke by EMS after home health nurse noticed patient suddenly stopped speaking and slumped over. CT of the head was negative for hemorrhage but showed old left PCA territory infarction. Patient received IV TPA and IR was completed on 09/02/18 Type of Study: Bedside Swallow Evaluation Previous Swallow Assessment: None Diet Prior to this Study: NPO Temperature Spikes Noted: No Respiratory Status: Room air History of Recent Intubation: Yes Length of Intubations (days): (1- intubated for procedure then extubated post ) Date extubated: 09/02/18 Behavior/Cognition: Cooperative;Alert;Pleasant mood Oral Cavity Assessment: Within Functional Limits Oral Care Completed by SLP: No Oral Cavity - Dentition: Poor condition;Missing dentition Vision: Functional for self-feeding Self-Feeding Abilities: Needs assist Patient Positioning: Upright in chair;Postural control adequate for testing Baseline Vocal Quality: Normal Volitional Swallow: Able to elicit  Oral/Motor/Sensory Function Overall Oral Motor/Sensory Function: Within functional limits   Ice Chips Ice chips: Within functional limits Presentation: Spoon   Thin  Liquid Thin Liquid: Within functional limits Presentation: Spoon;Straw;Cup    Nectar Thick Nectar Thick Liquid: Not tested   Honey Thick Honey Thick Liquid: Not tested   Puree Puree: Within functional limits Presentation: Spoon   Solid     Solid: Impaired Presentation: Spoon Oral Phase Impairments: Impaired mastication(Mildly increased d/t reduced dentition)     Richell Corker I. Vear Clock, MS, CCC-SLP Acute Rehabilitation Services Office number [phone] Pager [phone]  Scheryl Marten 09/03/2018,3:37 PM

## 2018-09-03 NOTE — Progress Notes (Signed)
Referring Physician(s): Aroor, Georgiana Spinner R  Supervising Physician: Julieanne Cotton  Patient Status:  [facility] - In-pt  Chief Complaint: None  Subjective:  Left MCA M1 segment occlusion s/p emergent mechanical thrombectomy achieving a TICI 3 revascularization 09/02/2018 by Dr. Corliss Skains. Patient awake and alert laying in bed with no complaints at this time. Can spontaneously move all extremities. Right groin incision c/d/i.   Allergies: Patient has no known allergies.  Medications: Prior to Admission medications   Medication Sig Start Date End Date Taking? Authorizing Provider  amLODipine (NORVASC) 5 MG tablet Take 5 mg by mouth daily.    [provider]  docusate sodium (COLACE) 100 MG capsule Take 1 capsule (100 mg total) by mouth every 12 (twelve) hours. 01/29/18   Lenox Ponds, MD  dorzolamide-timolol (COSOPT) 22.3-6.8 MG/ML ophthalmic solution Place 1 drop into both eyes 2 (two) times daily.    [provider]  furosemide (LASIX) 20 MG tablet Take 20 mg by mouth daily.    [provider]  HYDROcodone-acetaminophen (NORCO/VICODIN) 5-325 MG tablet Take 1 tablet by mouth every 4 (four) hours as needed for moderate pain. for pain 12/19/17   [provider]  LORazepam (ATIVAN) 0.5 MG tablet Take 0.5 mg by mouth at bedtime as needed for anxiety.  12/19/17   [provider]  LORazepam (ATIVAN) 1 MG tablet Take 1 tablet (1 mg total) by mouth at bedtime as needed for anxiety. 06/20/16   Arby Barrette, MD  polyethylene glycol (MIRALAX / GLYCOLAX) packet Take 17 g by mouth daily. 11/04/17   Pricilla Loveless, MD  Sodium Phosphates (FLEET ENEMA RE) Place 1 application rectally 2 (two) times daily as needed (constipation).    [provider]     Vital Signs: BP (!) 113/38   Pulse 85   Temp 98.3 F (36.8 C) (Axillary)   Resp 20   Ht 5\' 5"  (1.651 m)   Wt 125 lb (56.7 kg)   SpO2 100%   BMI 20.80 kg/m   Physical Exam Vitals  signs and nursing note reviewed.  Constitutional:      General: She is not in acute distress.    Appearance: Normal appearance.  Pulmonary:     Effort: Pulmonary effort is normal. No respiratory distress.  Skin:    General: Skin is warm and dry.     Comments: Right groin incision soft without active bleeding or hematoma.  Neurological:     Mental Status: She is alert.     Comments: Alert, awake, and oriented x3. Speech and comprehension intact. PERRL bilaterally. EOMs not assessed. Visual fields not assessed. No facial asymmetry. Tongue midline. Can spontaneously move all extremities. No pronator drift. Fine motor and coordination not assessed. Gait not assessed. Romberg not assessed. Heel to toe not assessed. Distal pulses palpable bilaterally with Doppler.  Psychiatric:        Mood and Affect: Mood normal.        Behavior: Behavior normal.        Thought Content: Thought content normal.        Judgment: Judgment normal.     Imaging: Ct Angio Head W Or Wo Contrast  Result Date: 09/02/2018 CLINICAL DATA:  85 year old female code stroke with right side weakness and unable to talk. EXAM: CT ANGIOGRAPHY HEAD AND NECK TECHNIQUE: Multidetector CT imaging of the head and neck was performed using the standard protocol during bolus administration of intravenous contrast. Multiplanar CT image reconstructions and MIPs were obtained to evaluate the vascular  anatomy. Carotid stenosis measurements (when applicable) are obtained utilizing NASCET criteria, using the distal internal carotid diameter as the denominator. CONTRAST:  75mL ISOVUE-370 IOPAMIDOL (ISOVUE-370) INJECTION 76% COMPARISON:  CT head 1541 hours today. FINDINGS: CTA NECK Skeleton: No acute osseous abnormality identified. Upper chest: Negative visible lungs and superior mediastinum. Other neck: Thyroid goiter. Large 11 millimeter right submandibular gland sialolith. Aortic arch: Heavily calcified.  Three vessel arch  configuration. Right carotid system: No brachiocephalic or right CCA origin stenosis. Minimal plaque at the right carotid bifurcation and no cervical right ICA stenosis. Left carotid system: No left CCA origin stenosis. Heavily calcified left ECA origin, but the left ICA origin and bulb are relatively spared and there is no cervical left ICA stenosis. Vertebral arteries: Plaque in the proximal right subclavian artery appears mildly ulcerated but there is no significant stenosis. Likewise, calcified plaque at the left vertebral origin and V1 segment with up to moderate stenosis which is maximal on series 6, image 583 in the distal V1. The right vertebral is then patent to the skull base without additional stenosis. Up to 50% stenosis of the proximal left subclavian artery due to multifocal soft and calcified plaque. Calcification adjacent to the left vertebral artery origin, but no associated stenosis. Mild left V1 and V3 calcified plaque without stenosis. CTA HEAD Posterior circulation: Codominant distal vertebral arteries are patent without stenosis to the vertebrobasilar junction. Patent right PICA and dominant appearing left AICA origins. Patent basilar artery with mild irregularity and no significant stenosis. Patent SCA and PCA origins. Mild left P1 origin stenosis. Mild to moderate bilateral P2 segment stenosis. At least moderate bilateral P3 segment stenosis, up to severe on the left (series 12, image 25). Anterior circulation: Both ICA siphons are patent but calcified. On the right there is no significant siphon stenosis. On the left there is mild to moderate anterior genu and proximal supraclinoid segment stenosis. Patent carotid termini. Patent MCA and ACA origins. Anterior communicating artery and bilateral ACA branches are within normal limits. Right MCA M1 is patent. There is an early bifurcation or prominent right anterior temporal artery. Just distal to this there is moderate to severe stenosis  proximal to trifurcation on series 10, image 19 and series 11, image 21. Right MCA branches are otherwise mildly irregular. Left MCA M1 and bifurcation are patent. There is an occluded middle sylvian division M3 branch best seen on series 12, image 30 which is just beyond the bifurcation. There is superimposed moderate irregularity and stenosis of the posterior M2 or M3 (same image). Poor collaterals suggested on these images. Venous sinuses: Not well evaluated due to early contrast phase. Anatomic variants: None. Review of the MIP images confirms the above findings IMPRESSION: 1. Positive for ELVO: Occluded Left MCA M2 versus large M3. This is just beyond the left MCA bifurcation. This finding was communicated to Dr. Laurence Slate at 1616 hours by text page via the [facility] messaging system, and also discussed between Drs. Shogry and Aroor. 2. Intracranial atherosclerosis with multiple other stenosis: - left ICA anterior genu/supraclinoid segment (moderate). - right MCA distal M1 or proximal M2 (moderate to severe). - bilateral PCA P2 segments (mild-to-moderate) and distal branches (moderate to severe). 3. No significant carotid stenosis in the neck. Moderate proximal right vertebral artery stenosis in the neck due to plaque. 4.  Aortic Atherosclerosis (ICD10-I70.0). 5. Right submandibular gland 11 mm sialolith. Electronically Signed   By: Odessa Fleming M.D.   On: 09/02/2018 16:32   Ct Angio Neck W Or  Wo Contrast  Result Date: 09/02/2018 CLINICAL DATA:  85 year old female code stroke with right side weakness and unable to talk. EXAM: CT ANGIOGRAPHY HEAD AND NECK TECHNIQUE: Multidetector CT imaging of the head and neck was performed using the standard protocol during bolus administration of intravenous contrast. Multiplanar CT image reconstructions and MIPs were obtained to evaluate the vascular anatomy. Carotid stenosis measurements (when applicable) are obtained utilizing NASCET criteria, using the distal internal carotid  diameter as the denominator. CONTRAST:  75mL ISOVUE-370 IOPAMIDOL (ISOVUE-370) INJECTION 76% COMPARISON:  CT head 1541 hours today. FINDINGS: CTA NECK Skeleton: No acute osseous abnormality identified. Upper chest: Negative visible lungs and superior mediastinum. Other neck: Thyroid goiter. Large 11 millimeter right submandibular gland sialolith. Aortic arch: Heavily calcified.  Three vessel arch configuration. Right carotid system: No brachiocephalic or right CCA origin stenosis. Minimal plaque at the right carotid bifurcation and no cervical right ICA stenosis. Left carotid system: No left CCA origin stenosis. Heavily calcified left ECA origin, but the left ICA origin and bulb are relatively spared and there is no cervical left ICA stenosis. Vertebral arteries: Plaque in the proximal right subclavian artery appears mildly ulcerated but there is no significant stenosis. Likewise, calcified plaque at the left vertebral origin and V1 segment with up to moderate stenosis which is maximal on series 6, image 583 in the distal V1. The right vertebral is then patent to the skull base without additional stenosis. Up to 50% stenosis of the proximal left subclavian artery due to multifocal soft and calcified plaque. Calcification adjacent to the left vertebral artery origin, but no associated stenosis. Mild left V1 and V3 calcified plaque without stenosis. CTA HEAD Posterior circulation: Codominant distal vertebral arteries are patent without stenosis to the vertebrobasilar junction. Patent right PICA and dominant appearing left AICA origins. Patent basilar artery with mild irregularity and no significant stenosis. Patent SCA and PCA origins. Mild left P1 origin stenosis. Mild to moderate bilateral P2 segment stenosis. At least moderate bilateral P3 segment stenosis, up to severe on the left (series 12, image 25). Anterior circulation: Both ICA siphons are patent but calcified. On the right there is no significant siphon  stenosis. On the left there is mild to moderate anterior genu and proximal supraclinoid segment stenosis. Patent carotid termini. Patent MCA and ACA origins. Anterior communicating artery and bilateral ACA branches are within normal limits. Right MCA M1 is patent. There is an early bifurcation or prominent right anterior temporal artery. Just distal to this there is moderate to severe stenosis proximal to trifurcation on series 10, image 19 and series 11, image 21. Right MCA branches are otherwise mildly irregular. Left MCA M1 and bifurcation are patent. There is an occluded middle sylvian division M3 branch best seen on series 12, image 30 which is just beyond the bifurcation. There is superimposed moderate irregularity and stenosis of the posterior M2 or M3 (same image). Poor collaterals suggested on these images. Venous sinuses: Not well evaluated due to early contrast phase. Anatomic variants: None. Review of the MIP images confirms the above findings IMPRESSION: 1. Positive for ELVO: Occluded Left MCA M2 versus large M3. This is just beyond the left MCA bifurcation. This finding was communicated to Dr. Laurence SlateAroor at 1616 hours by text page via the [facility] messaging system, and also discussed between Drs. Shogry and Aroor. 2. Intracranial atherosclerosis with multiple other stenosis: - left ICA anterior genu/supraclinoid segment (moderate). - right MCA distal M1 or proximal M2 (moderate to severe). - bilateral PCA P2 segments (  mild-to-moderate) and distal branches (moderate to severe). 3. No significant carotid stenosis in the neck. Moderate proximal right vertebral artery stenosis in the neck due to plaque. 4.  Aortic Atherosclerosis (ICD10-I70.0). 5. Right submandibular gland 11 mm sialolith. Electronically Signed   By: Odessa FlemingH  Hall M.D.   On: 09/02/2018 16:32   Ct Head Code Stroke Wo Contrast  Result Date: 09/02/2018 CLINICAL DATA:  Code stroke. Last seen normal 1455 hours. Acute speech disturbance and right-sided  weakness. EXAM: CT HEAD WITHOUT CONTRAST TECHNIQUE: Contiguous axial images were obtained from the base of the skull through the vertex without intravenous contrast. COMPARISON:  10/03/2013 FINDINGS: Brain: Chronic small-vessel ischemic changes affect the pons, with wall air in degeneration on the left. No focal cerebellar insult. Old infarction in the left PCA territory affecting the posteromedial temporal lobe and occipital lobe. Chronic small-vessel ischemic changes affect the thalami and basal ganglia regions. I do not see evidence of acute infarction, mass lesion, hemorrhage, hydrocephalus or extra-axial collection. Vascular: There is atherosclerotic calcification of the major vessels at the base of the brain. Skull: Negative Sinuses/Orbits: Clear/normal Other: None ASPECTS (Alberta Stroke Program Early CT Score) - Ganglionic level infarction (caudate, lentiform nuclei, internal capsule, insula, M1-M3 cortex): 7 - Supraganglionic infarction (M4-M6 cortex): 3 Total score (0-10 with 10 being normal): 10 IMPRESSION: 1. No acute finding by CT. Extensive chronic small-vessel ischemic changes throughout the brain. Old left PCA territory infarction. 2. ASPECTS is 10. 3. These results were communicated to Dr. Laurence SlateAroor at 3:47 pmon 2/11/2020by text page via the [facility] messaging system. Electronically Signed   By: Paulina FusiMark  Shogry M.D.   On: 09/02/2018 15:51    Labs:  CBC: Recent Labs    11/07/17 1423 01/29/18 0218 09/02/18 1715 09/03/18 0603  WBC 4.2 8.4 4.0 13.0*  HGB 12.5 13.0 8.5* 8.8*  HCT 37.4 39.5 27.2* 27.8*  PLT 250 283 230 278    COAGS: Recent Labs    09/02/18 1715  INR 1.00  APTT 24    BMP: Recent Labs    11/07/17 1423 01/29/18 0006 09/02/18 1715 09/03/18 0603  NA 135 136 135 134*  K 4.7 5.2* 4.4 4.4  CL 101 101 107 106  CO2 24 22 20* 19*  GLUCOSE 132* 126* 113* 126*  BUN 28* 42* 32* 33*  CALCIUM 9.1 9.3 7.5* 7.9*  CREATININE 1.26* 1.78* 1.61* 1.94*  GFRNONAA 35* 23* 27* 21*    GFRAA 41* 27* 31* 25*    LIVER FUNCTION TESTS: Recent Labs    11/04/17 1434 11/07/17 1423 01/29/18 0006 09/02/18 1715  BILITOT 0.6 0.8 0.9 0.5  AST 21 20 32 20  ALT 13* 13* 17 14  ALKPHOS 58 59 59 38  PROT 7.5 7.2 7.3 4.7*  ALBUMIN 4.2 4.1 4.1 2.6*    Assessment and Plan:  Left MCA M1 segment occlusion s/p emergent mechanical thrombectomy achieving a TICI 3 revascularization 09/02/2018 by Dr. Corliss Skainseveshwar. Patient's condition improving- can spontaneously move all extremities. Right groin incision stable. Appreciate and agree with neurology management. IR to follow.   Electronically Signed: Elwin MochaAlexandra Jacquel Redditt, PA-C 09/03/2018, 9:11 AM   I spent a total of 25 Minutes at the the patient's bedside AND on the patient's hospital floor or unit, greater than 50% of which was counseling/coordinating care for left MCA M1 segment occlusion s/p revascularization.

## 2018-09-03 NOTE — Evaluation (Addendum)
Occupational Therapy Evaluation Patient Details Name: [name] MRN: [id number] DOB: 27-Dec-1920 Today's Date: 09/03/2018    History of Present Illness Pt is a 85 year old female presents with aphasia and right hemiplegia secondary to left MCA stroke caused by M2 occlusion- per neurology.  Patient received IV TPA and was taken to IR for emergent mechanical thrombectomy on 09/02/18.  PMH: Cardiomyopathy, hernia repair.    Clinical Impression   PTA patient independent with ADLs and mobility using cane/RW as needed, limited IADLs. Patient admitted for above and limited by problem list below, including generalized weakness and decreased activity tolerance.  Soft BP during session (114/47), reports dizziness upon standing but fades when sitting.  Completes UB ADls with min assist, grooming with setup assist, LB ADls with mod assist +2 safety, simulated toilet transfer to recliner with min assist+2.  Patients family reports availability of 24/7 support at discharge, as PT/OT recommend. Patient will benefit from continued OT services while admitted and after dc at [facility] level in order to optimize independence and safety with ADLs/mobility.    Follow Up Recommendations  Home health OT;Supervision/Assistance - 24 hour    Equipment Recommendations  3 in 1 bedside commode    Recommendations for Other Services       Precautions / Restrictions Precautions Precautions: Fall Precaution Comments: HOH Restrictions Weight Bearing Restrictions: No      Mobility Bed Mobility Overal bed mobility: Needs Assistance Bed Mobility: Supine to Sit     Supine to sit: Min assist;HOB elevated     General bed mobility comments: min assist to scoot forward to EOB, increased time and effort   Transfers Overall transfer level: Needs assistance Equipment used: 2 person hand held assist Transfers: Sit to/from [employer] Sit to Stand: Min assist;+2 physical assistance;+2  safety/equipment Stand pivot transfers: Min assist;+2 physical assistance;+2 safety/equipment       General transfer comment: min assist to power up and steady in standing, limited tolerance     Balance Overall balance assessment: Needs assistance Sitting-balance support: No upper extremity supported;Feet supported Sitting balance-Leahy Scale: Fair Sitting balance - Comments: limited dynamic balance; supervision statically    Standing balance support: Bilateral upper extremity supported;During functional activity Standing balance-Leahy Scale: Poor Standing balance comment: reliant on B UE and external support                           ADL either performed or assessed with clinical judgement   ADL Overall ADL's : Needs assistance/impaired     Grooming: Set up;Sitting;Wash/dry hands;Wash/dry face   Upper Body Bathing: Set up;Sitting   Lower Body Bathing: Moderate assistance;+2 for physical assistance;Sit to/from stand Lower Body Bathing Details (indicate cue type and reason): +2 min assist sit to stand, decreased reach to B feet Upper Body Dressing : Minimal assistance;Sitting   Lower Body Dressing: Moderate assistance;+2 for physical assistance;Sit to/from stand Lower Body Dressing Details (indicate cue type and reason): +2 min assist sit to stand, decreased reach to B feet- assist to don socks  Toilet Transfer: Minimal assistance;+2 for safety/equipment;Stand-pivot;+2 for physical assistance Toilet Transfer Details (indicate cue type and reason): simulated to recliner          Functional mobility during ADLs: Minimal assistance;+2 for safety/equipment;+2 for physical assistance       Vision Baseline Vision/History: No visual deficits Patient Visual Report: No change from baseline Additional Comments: functionally appears [facility]      Perception     Praxis  Pertinent Vitals/Pain Pain Assessment: No/denies pain     Hand Dominance Right    Extremity/Trunk Assessment Upper Extremity Assessment Upper Extremity Assessment: Generalized weakness(appears WFL for age)   Lower Extremity Assessment Lower Extremity Assessment: Defer to PT evaluation   Cervical / Trunk Assessment Cervical / Trunk Assessment: Kyphotic   Communication Communication Communication: No difficulties   Cognition Arousal/Alertness: Awake/alert Behavior During Therapy: WFL for tasks assessed/performed Overall Cognitive Status: Within Functional Limits for tasks assessed                                     General Comments  VSS, family present and supportive    Exercises     Shoulder Instructions      Home Living Family/patient expects to be discharged to:: Private residence Living Arrangements: Alone(has assist 12 hrs a day ) Available Help at Discharge: Personal care attendant;Family;Available 24 hours/day Type of Home: House Home Access: Stairs to enter [employer] CorporationEntrance Stairs-Number of Steps: 4 Entrance Stairs-Rails: Right Home Layout: One level     Bathroom Shower/Tub: Chief Strategy OfficerTub/shower unit   Bathroom Toilet: Standard Bathroom Accessibility: Yes   Home Equipment: Environmental consultantWalker - 2 wheels;Cane - single point;Shower seat   Additional Comments: reports aide assists with making food but she completes all other ADLs, aide also helps her get ready for church      Prior Functioning/Environment Level of Independence: Independent        Comments: uses both cane and RW but cane mostly, doesn't drive        OT Problem List: Decreased strength;Decreased activity tolerance;Impaired balance (sitting and/or standing);Decreased safety awareness;Decreased knowledge of use of DME or AE      OT Treatment/Interventions: Self-care/ADL training;DME and/or AE instruction;Balance training;Therapeutic activities;Patient/family education    OT Goals(Current goals can be found in the care plan section) Acute Rehab OT Goals Patient Stated Goal: to get  home  OT Goal Formulation: With patient/family Time For Goal Achievement: 09/17/18 Potential to Achieve Goals: Good  OT Frequency: Min 2X/week   Barriers to D/C:            Co-evaluation PT/OT/SLP Co-Evaluation/Treatment: Yes Reason for Co-Treatment: Complexity of the patient's impairments (multi-system involvement) PT goals addressed during session: Mobility/safety with mobility OT goals addressed during session: ADL's and self-care;Other (comment)(mobility)      AM-PAC OT "6 Clicks" Daily Activity     Outcome Measure Help from another person eating meals?: Total(NPO) Help from another person taking care of personal grooming?: A Little Help from another person toileting, which includes using toliet, bedpan, or urinal?: A Lot Help from another person bathing (including washing, rinsing, drying)?: A Lot Help from another person to put on and taking off regular upper body clothing?: A Little Help from another person to put on and taking off regular lower body clothing?: A Lot 6 Click Score: 13   End of Session Equipment Utilized During Treatment: Gait belt Nurse Communication: Mobility status  Activity Tolerance: Patient limited by fatigue Patient left: in chair;with call bell/phone within reach;with chair alarm set;with family/visitor present  OT Visit Diagnosis: Other abnormalities of gait and mobility (R26.89);Muscle weakness (generalized) (M62.81)                Time: [id number] OT Time Calculation (min): 23 min Charges:  OT General Charges $OT Visit: 1 Visit OT Evaluation $OT Eval Moderate Complexity: 1 Mod  Chancy Milroyhristie S Salim Forero, OT Acute Rehabilitation Services Pager [phone] Office  [phone]   Chancy MilroyChristie S Isayah Ignasiak 09/03/2018, 2:17 PM

## 2018-09-03 NOTE — Progress Notes (Signed)
STROKE TEAM PROGRESS NOTE   INTERVAL HISTORY Her RN is at the bedside.  Patient awake and talking this am. Had sheath site closed during the night d/t bleeding.   Vitals:   09/03/18 0645 09/03/18 0700 09/03/18 0800 09/03/18 0900  BP: (!) 106/32 (!) 108/35 (!) 113/38 (!) 111/35  Pulse: 76 78 85 78  Resp: 13 15 20 14   Temp:   98.3 F (36.8 C)   TempSrc:   Axillary   SpO2: 100% 100% 100% 94%  Weight:      Height:        CBC:  Recent Labs  Lab 09/02/18 1715 09/03/18 0603  WBC 4.0 13.0*  NEUTROABS 2.3 11.4*  HGB 8.5* 8.8*  HCT 27.2* 27.8*  MCV 95.1 96.9  PLT 230 278    Basic Metabolic Panel:  Recent Labs  Lab 09/02/18 1715 09/03/18 0603  NA 135 134*  K 4.4 4.4  CL 107 106  CO2 20* 19*  GLUCOSE 113* 126*  BUN 32* 33*  CREATININE 1.61* 1.94*  CALCIUM 7.5* 7.9*   Lipid Panel:     Component Value Date/Time   CHOL 176 09/03/2018 0603   TRIG 215 (H) 09/03/2018 0603   HDL 38 (L) 09/03/2018 0603   CHOLHDL 4.6 09/03/2018 0603   VLDL 43 (H) 09/03/2018 0603   LDLCALC 95 09/03/2018 0603   HgbA1c:  Lab Results  Component Value Date   HGBA1C 5.1 09/03/2018   Urine Drug Screen: No results found for: LABOPIA, COCAINSCRNUR, LABBENZ, AMPHETMU, THCU, LABBARB  Alcohol Level No results found for: ETH  IMAGING Ct Angio Head W Or Wo Contrast  Result Date: 09/02/2018 CLINICAL DATA:  85 year old female code stroke with right side weakness and unable to talk. EXAM: CT ANGIOGRAPHY HEAD AND NECK TECHNIQUE: Multidetector CT imaging of the head and neck was performed using the standard protocol during bolus administration of intravenous contrast. Multiplanar CT image reconstructions and MIPs were obtained to evaluate the vascular anatomy. Carotid stenosis measurements (when applicable) are obtained utilizing NASCET criteria, using the distal internal carotid diameter as the denominator. CONTRAST:  75mL ISOVUE-370 IOPAMIDOL (ISOVUE-370) INJECTION 76% COMPARISON:  CT head 1541 hours  today. FINDINGS: CTA NECK Skeleton: No acute osseous abnormality identified. Upper chest: Negative visible lungs and superior mediastinum. Other neck: Thyroid goiter. Large 11 millimeter right submandibular gland sialolith. Aortic arch: Heavily calcified.  Three vessel arch configuration. Right carotid system: No brachiocephalic or right CCA origin stenosis. Minimal plaque at the right carotid bifurcation and no cervical right ICA stenosis. Left carotid system: No left CCA origin stenosis. Heavily calcified left ECA origin, but the left ICA origin and bulb are relatively spared and there is no cervical left ICA stenosis. Vertebral arteries: Plaque in the proximal right subclavian artery appears mildly ulcerated but there is no significant stenosis. Likewise, calcified plaque at the left vertebral origin and V1 segment with up to moderate stenosis which is maximal on series 6, image 583 in the distal V1. The right vertebral is then patent to the skull base without additional stenosis. Up to 50% stenosis of the proximal left subclavian artery due to multifocal soft and calcified plaque. Calcification adjacent to the left vertebral artery origin, but no associated stenosis. Mild left V1 and V3 calcified plaque without stenosis. CTA HEAD Posterior circulation: Codominant distal vertebral arteries are patent without stenosis to the vertebrobasilar junction. Patent right PICA and dominant appearing left AICA origins. Patent basilar artery with mild irregularity and no significant stenosis. Patent SCA and PCA  origins. Mild left P1 origin stenosis. Mild to moderate bilateral P2 segment stenosis. At least moderate bilateral P3 segment stenosis, up to severe on the left (series 12, image 25). Anterior circulation: Both ICA siphons are patent but calcified. On the right there is no significant siphon stenosis. On the left there is mild to moderate anterior genu and proximal supraclinoid segment stenosis. Patent carotid  termini. Patent MCA and ACA origins. Anterior communicating artery and bilateral ACA branches are within normal limits. Right MCA M1 is patent. There is an early bifurcation or prominent right anterior temporal artery. Just distal to this there is moderate to severe stenosis proximal to trifurcation on series 10, image 19 and series 11, image 21. Right MCA branches are otherwise mildly irregular. Left MCA M1 and bifurcation are patent. There is an occluded middle sylvian division M3 branch best seen on series 12, image 30 which is just beyond the bifurcation. There is superimposed moderate irregularity and stenosis of the posterior M2 or M3 (same image). Poor collaterals suggested on these images. Venous sinuses: Not well evaluated due to early contrast phase. Anatomic variants: None. Review of the MIP images confirms the above findings IMPRESSION: 1. Positive for ELVO: Occluded Left MCA M2 versus large M3. This is just beyond the left MCA bifurcation. This finding was communicated to Dr. Laurence Slate at 1616 hours by text page via the [facility] messaging system, and also discussed between Drs. Shogry and Aroor. 2. Intracranial atherosclerosis with multiple other stenosis: - left ICA anterior genu/supraclinoid segment (moderate). - right MCA distal M1 or proximal M2 (moderate to severe). - bilateral PCA P2 segments (mild-to-moderate) and distal branches (moderate to severe). 3. No significant carotid stenosis in the neck. Moderate proximal right vertebral artery stenosis in the neck due to plaque. 4.  Aortic Atherosclerosis (ICD10-I70.0). 5. Right submandibular gland 11 mm sialolith. Electronically Signed   By: Odessa Fleming M.D.   On: 09/02/2018 16:32   Ct Angio Neck W Or Wo Contrast  Result Date: 09/02/2018 CLINICAL DATA:  85 year old female code stroke with right side weakness and unable to talk. EXAM: CT ANGIOGRAPHY HEAD AND NECK TECHNIQUE: Multidetector CT imaging of the head and neck was performed using the standard  protocol during bolus administration of intravenous contrast. Multiplanar CT image reconstructions and MIPs were obtained to evaluate the vascular anatomy. Carotid stenosis measurements (when applicable) are obtained utilizing NASCET criteria, using the distal internal carotid diameter as the denominator. CONTRAST:  56mL ISOVUE-370 IOPAMIDOL (ISOVUE-370) INJECTION 76% COMPARISON:  CT head 1541 hours today. FINDINGS: CTA NECK Skeleton: No acute osseous abnormality identified. Upper chest: Negative visible lungs and superior mediastinum. Other neck: Thyroid goiter. Large 11 millimeter right submandibular gland sialolith. Aortic arch: Heavily calcified.  Three vessel arch configuration. Right carotid system: No brachiocephalic or right CCA origin stenosis. Minimal plaque at the right carotid bifurcation and no cervical right ICA stenosis. Left carotid system: No left CCA origin stenosis. Heavily calcified left ECA origin, but the left ICA origin and bulb are relatively spared and there is no cervical left ICA stenosis. Vertebral arteries: Plaque in the proximal right subclavian artery appears mildly ulcerated but there is no significant stenosis. Likewise, calcified plaque at the left vertebral origin and V1 segment with up to moderate stenosis which is maximal on series 6, image 583 in the distal V1. The right vertebral is then patent to the skull base without additional stenosis. Up to 50% stenosis of the proximal left subclavian artery due to multifocal soft and calcified plaque.  Calcification adjacent to the left vertebral artery origin, but no associated stenosis. Mild left V1 and V3 calcified plaque without stenosis. CTA HEAD Posterior circulation: Codominant distal vertebral arteries are patent without stenosis to the vertebrobasilar junction. Patent right PICA and dominant appearing left AICA origins. Patent basilar artery with mild irregularity and no significant stenosis. Patent SCA and PCA origins. Mild left  P1 origin stenosis. Mild to moderate bilateral P2 segment stenosis. At least moderate bilateral P3 segment stenosis, up to severe on the left (series 12, image 25). Anterior circulation: Both ICA siphons are patent but calcified. On the right there is no significant siphon stenosis. On the left there is mild to moderate anterior genu and proximal supraclinoid segment stenosis. Patent carotid termini. Patent MCA and ACA origins. Anterior communicating artery and bilateral ACA branches are within normal limits. Right MCA M1 is patent. There is an early bifurcation or prominent right anterior temporal artery. Just distal to this there is moderate to severe stenosis proximal to trifurcation on series 10, image 19 and series 11, image 21. Right MCA branches are otherwise mildly irregular. Left MCA M1 and bifurcation are patent. There is an occluded middle sylvian division M3 branch best seen on series 12, image 30 which is just beyond the bifurcation. There is superimposed moderate irregularity and stenosis of the posterior M2 or M3 (same image). Poor collaterals suggested on these images. Venous sinuses: Not well evaluated due to early contrast phase. Anatomic variants: None. Review of the MIP images confirms the above findings IMPRESSION: 1. Positive for ELVO: Occluded Left MCA M2 versus large M3. This is just beyond the left MCA bifurcation. This finding was communicated to Dr. Laurence Slate at 1616 hours by text page via the [facility] messaging system, and also discussed between Drs. Shogry and Aroor. 2. Intracranial atherosclerosis with multiple other stenosis: - left ICA anterior genu/supraclinoid segment (moderate). - right MCA distal M1 or proximal M2 (moderate to severe). - bilateral PCA P2 segments (mild-to-moderate) and distal branches (moderate to severe). 3. No significant carotid stenosis in the neck. Moderate proximal right vertebral artery stenosis in the neck due to plaque. 4.  Aortic Atherosclerosis (ICD10-I70.0).  5. Right submandibular gland 11 mm sialolith. Electronically Signed   By: Odessa Fleming M.D.   On: 09/02/2018 16:32   Ct Head Code Stroke Wo Contrast  Result Date: 09/02/2018 CLINICAL DATA:  Code stroke. Last seen normal 1455 hours. Acute speech disturbance and right-sided weakness. EXAM: CT HEAD WITHOUT CONTRAST TECHNIQUE: Contiguous axial images were obtained from the base of the skull through the vertex without intravenous contrast. COMPARISON:  10/03/2013 FINDINGS: Brain: Chronic small-vessel ischemic changes affect the pons, with wall air in degeneration on the left. No focal cerebellar insult. Old infarction in the left PCA territory affecting the posteromedial temporal lobe and occipital lobe. Chronic small-vessel ischemic changes affect the thalami and basal ganglia regions. I do not see evidence of acute infarction, mass lesion, hemorrhage, hydrocephalus or extra-axial collection. Vascular: There is atherosclerotic calcification of the major vessels at the base of the brain. Skull: Negative Sinuses/Orbits: Clear/normal Other: None ASPECTS (Alberta Stroke Program Early CT Score) - Ganglionic level infarction (caudate, lentiform nuclei, internal capsule, insula, M1-M3 cortex): 7 - Supraganglionic infarction (M4-M6 cortex): 3 Total score (0-10 with 10 being normal): 10 IMPRESSION: 1. No acute finding by CT. Extensive chronic small-vessel ischemic changes throughout the brain. Old left PCA territory infarction. 2. ASPECTS is 10. 3. These results were communicated to Dr. Laurence Slate at 3:47 pmon 2/11/2020by text page  via the [employer] messaging system. Electronically Signed   By: Paulina FusiMark  Shogry M.D.   On: 09/02/2018 15:51   Cerebral Angio S/P Lt common carotid arteriogram followed by complete revascularization of occluded dominant inferior division with x 1 pass with embotrap 5mmm x 33 mm retriever  device  Achieving a TICI 3 revascularization.   PHYSICAL EXAM General: Appears well-developed  Psych: Affect appropriate  to situation Eyes: No scleral injection HENT: No OP obstrucion Head: Normocephalic.  Cardiovascular: Normal rate and regular rhythm.     Neurological Examination Mental Status: Awake, alert oriented x 3, following any commands Cranial Nerves: II: Visual fields : Right homonymous hemianopsia III,IV, VI: ptosis not present, deviation resolved, pupils equal, round, reactive to light and accommodation VII: Right facial droop XII: midline tongue extension Motor: Right :  Upper extremity   4/5                                      Left:     Upper extremity   4+/5             Lower extremity   4/5                                                  Lower extremity   4/5 Tone and bulk:normal tone throughout; no atrophy noted Sensory: Intact to light touch throughout  Deep Tendon Reflexes: 2+ and symmetric throughout Plantars: Right: downgoing                                Left: downgoing Cerebellar: Intact finger to nose Gait: Unable to assess  ASSESSMENT/PLAN Ms. [name] is a 85 y.o. female with history of cardiomyopathy presenting with speech arrest with R facial, L gaze, R HP. Received tPA 2/11 at 1549.  Stroke:  left MCA infarct s/p tPA and IR,  embolic secondary to unknown source  Code Stroke CT head No acute stroke. Old L PCA infarct. Small vessel disease. Atrophy. ASPECTS 10.     CTA head & neck ELVO L MCA M2. Intracranial atherosclerosis w/ mult stenosis (L ICA, R M1/M2, B PCA P2). Aortic atherosclerosis. R submandibular sialolith.  Post IR CT no hmg or mass effect  MRI  pending   2D Echo  pending   LDL 95  HgbA1c 5.1  SCDs for VTE prophylaxis Diet Order            Diet NPO time specified  Diet effective now             No antithrombotic prior to admission, now on No antithrombotic as within 24h of tPA. Add aspirin 81 mg if 24h imaging neg for hmg  Therapy recommendations:  pending   Disposition:  pending   D/c a line  Acute Blood Loss  Anemia  Bleeding from R groin incision, sheath  Mechanical closure by IR during the night  Hypertension  Home meds:  norvasc 5, lasic 20 . SBP goal 120-140 per IR x 24h . Long-term BP goal normotensive  Hyperlipidemia  Home meds:  No statin  LDL 95, goal < 70  Add statin once able to swallow  Continue statin at discharge  Other Stroke Risk  Factors  Advanced age  Cardiomyopathy  Other Active Problems  CKD stage III Cr 1.94  Hospital day # 1  Annie MainSharon Biby, MSN, APRN, ANVP-BC, AGPCNP-BC Advanced Practice Stroke Nurse [facility] See Amion for Schedule & Pager information 09/03/2018 11:34 AM   S/p TPA and IR Doing very well MRI pending  24hr CTH pending   To contact Stroke Continuity provider, please refer to [email or web address]. After hours, contact General Neurology

## 2018-09-03 NOTE — Progress Notes (Signed)
OT Cancellation Note  Patient Details Name: Lorra HalsBernice F Riel MRN: [id number] DOB: 01/23/1921   Cancelled Treatment:    Reason Eval/Treat Not Completed: Active bedrest order.  Will follow and initiate OT eval when appropriate and able.    Chancy Milroyhristie S Kofi Murrell, OT Acute Rehabilitation Services Pager [phone] Office [phone]    Chancy MilroyChristie S Judyth Demarais 09/03/2018, 7:50 AM

## 2018-09-03 NOTE — Progress Notes (Signed)
8Fr RFA sheath removed using 7Fr Exoseal and manual pressure at 2335.  This was performed earlier than usual when a patient has received tPa. The were reservations to doing this with concerns of compression time being lengthy. Hemostasis obtained at 0000. Site normal, no hematoma. Distal pulses obtained with doppler with RN present.  Pressure dressing applied.

## 2018-09-03 NOTE — CV Procedure (Signed)
Echocardiogram not completed, the patient is undergoing an MRI at this time.  Leta Jungling [facility] 2/12 14:47

## 2018-09-03 NOTE — Progress Notes (Signed)
Hemostasis achieved at 0000 [facility]

## 2018-09-04 ENCOUNTER — Encounter (HOSPITAL_COMMUNITY): Payer: Self-pay | Admitting: Interventional Radiology

## 2018-09-04 ENCOUNTER — Inpatient Hospital Stay (HOSPITAL_COMMUNITY): Payer: Medicare Other

## 2018-09-04 DIAGNOSIS — I63412 Cerebral infarction due to embolism of left middle cerebral artery: Secondary | ICD-10-CM

## 2018-09-04 DIAGNOSIS — I639 Cerebral infarction, unspecified: Secondary | ICD-10-CM

## 2018-09-04 DIAGNOSIS — D62 Acute posthemorrhagic anemia: Secondary | ICD-10-CM | POA: Diagnosis not present

## 2018-09-04 DIAGNOSIS — N183 Chronic kidney disease, stage 3 unspecified: Secondary | ICD-10-CM | POA: Diagnosis present

## 2018-09-04 DIAGNOSIS — E785 Hyperlipidemia, unspecified: Secondary | ICD-10-CM | POA: Diagnosis present

## 2018-09-04 DIAGNOSIS — I6349 Cerebral infarction due to embolism of other cerebral artery: Secondary | ICD-10-CM

## 2018-09-04 DIAGNOSIS — I429 Cardiomyopathy, unspecified: Secondary | ICD-10-CM

## 2018-09-04 LAB — BASIC METABOLIC PANEL
Anion gap: 10 (ref 5–15)
BUN: 26 mg/dL — ABNORMAL HIGH (ref 8–23)
CO2: 16 mmol/L — ABNORMAL LOW (ref 22–32)
Calcium: 8.2 mg/dL — ABNORMAL LOW (ref 8.9–10.3)
Chloride: 112 mmol/L — ABNORMAL HIGH (ref 98–111)
Creatinine, Ser: 1.68 mg/dL — ABNORMAL HIGH (ref 0.44–1.00)
GFR calc Af Amer: 30 mL/min — ABNORMAL LOW (ref >60)
GFR calc non Af Amer: 26 mL/min — ABNORMAL LOW (ref >60)
Glucose, Bld: 130 mg/dL — ABNORMAL HIGH (ref 70–99)
Potassium: 4.1 mmol/L (ref 3.5–5.1)
SODIUM: 138 mmol/L (ref 135–145)

## 2018-09-04 LAB — ECHOCARDIOGRAM COMPLETE
Height: 65 in
Weight: 2000.01 oz

## 2018-09-04 LAB — CBC
HEMATOCRIT: 25.7 % — AB (ref 36.0–46.0)
HEMOGLOBIN: 7.8 g/dL — AB (ref 12.0–15.0)
MCH: 29.8 pg (ref 26.0–34.0)
MCHC: 30.4 g/dL (ref 30.0–36.0)
MCV: 98.1 fL (ref 80.0–100.0)
Platelets: 236 10*3/uL (ref 150–400)
RBC: 2.62 MIL/uL — ABNORMAL LOW (ref 3.87–5.11)
RDW: 17 % — ABNORMAL HIGH (ref 11.5–15.5)
WBC: 8.9 10*3/uL (ref 4.0–10.5)
nRBC: 0 % (ref 0.0–0.2)

## 2018-09-04 MED ORDER — DOCUSATE SODIUM 100 MG PO CAPS
100.0000 mg | ORAL_CAPSULE | Freq: Every day | ORAL | Status: DC
  Administered 2018-09-04 – 2018-09-05 (×2): 100 mg via ORAL
  Filled 2018-09-04 (×2): qty 1

## 2018-09-04 MED ORDER — ASPIRIN EC 81 MG PO TBEC
81.0000 mg | DELAYED_RELEASE_TABLET | Freq: Every day | ORAL | Status: DC
  Administered 2018-09-05: 81 mg via ORAL
  Filled 2018-09-04: qty 1

## 2018-09-04 MED ORDER — ATORVASTATIN CALCIUM 10 MG PO TABS
10.0000 mg | ORAL_TABLET | Freq: Every day | ORAL | Status: DC
  Administered 2018-09-04: 10 mg via ORAL
  Filled 2018-09-04: qty 1

## 2018-09-04 MED ORDER — SODIUM CHLORIDE 0.9 % IV BOLUS
500.0000 mL | Freq: Once | INTRAVENOUS | Status: AC
  Administered 2018-09-04: 500 mL via INTRAVENOUS

## 2018-09-04 MED ORDER — CLOPIDOGREL BISULFATE 75 MG PO TABS
75.0000 mg | ORAL_TABLET | Freq: Every day | ORAL | Status: DC
  Administered 2018-09-04 – 2018-09-05 (×2): 75 mg via ORAL
  Filled 2018-09-04 (×2): qty 1

## 2018-09-04 MED ORDER — POLYETHYLENE GLYCOL 3350 17 G PO PACK
17.0000 g | PACK | Freq: Every day | ORAL | Status: DC
  Administered 2018-09-04 – 2018-09-05 (×2): 17 g via ORAL
  Filled 2018-09-04 (×2): qty 1

## 2018-09-04 MED ORDER — PERFLUTREN LIPID MICROSPHERE
1.0000 mL | INTRAVENOUS | Status: AC | PRN
  Administered 2018-09-04: 4 mL via INTRAVENOUS
  Filled 2018-09-04: qty 10

## 2018-09-04 NOTE — Progress Notes (Signed)
Occupational Therapy Treatment Patient Details Name: [name] MRN: [id number] DOB: 06/05/1921 Today's Date: 09/04/2018    History of present illness Pt is a 85 year old female presents with aphasia and right hemiplegia secondary to left MCA stroke caused by M2 occlusion- per neurology.  Patient received IV TPA and was taken to IR for emergent mechanical thrombectomy on 09/02/18.  PMH: Cardiomyopathy, hernia repair.    OT comments  Patient progressing slowly.  Limited by decreased activity tolerance and generalized weakness.  Patient encouraged to sit in chair for breakfast, agreeable. Bed mobility with supervision today, stand pivot to recliner with min guard.  BP monitored supine: 127/51, seated after activity 124/42-- RN aware. Pt asymptomatic. Continue to recommend 24/7 support at discharge and HHOT.  Will follow.     Follow Up Recommendations  Home health OT;Supervision/Assistance - 24 hour    Equipment Recommendations  3 in 1 bedside commode    Recommendations for Other Services      Precautions / Restrictions Precautions Precautions: Fall Precaution Comments: HOH Restrictions Weight Bearing Restrictions: No       Mobility Bed Mobility Overal bed mobility: Needs Assistance Bed Mobility: Supine to Sit     Supine to sit: Supervision;HOB elevated     General bed mobility comments: no assist required- increased time   Transfers Overall transfer level: Needs assistance Equipment used: 2 person hand held assist Transfers: Sit to/from Stand;Stand Pivot Transfers Sit to Stand: Min guard Stand pivot transfers: Min guard       General transfer comment: min guard for safety and balance, denies lightheadedness today    Balance Overall balance assessment: Needs assistance Sitting-balance support: No upper extremity supported;Feet supported Sitting balance-Leahy Scale: Fair     Standing balance support: Single extremity supported;During functional  activity Standing balance-Leahy Scale: Poor Standing balance comment: reliant on at least 1 UE support                           ADL either performed or assessed with clinical judgement   ADL Overall ADL's : Needs assistance/impaired Eating/Feeding: Set up;Sitting Eating/Feeding Details (indicate cue type and reason): requires assist to open containers                      Toilet Transfer: Min [employer] systems engineerguard;Stand-pivot Toilet Transfer Details (indicate cue type and reason): simulated to recliner          Functional mobility during ADLs: Min guard       Vision       Perception     Praxis      Cognition Arousal/Alertness: Awake/alert Behavior During Therapy: WFL for tasks assessed/performed Overall Cognitive Status: Within Functional Limits for tasks assessed                                          Exercises     Shoulder Instructions       General Comments VSS     Pertinent Vitals/ Pain       Pain Assessment: No/denies pain  Home Living                                          Prior Functioning/Environment  Frequency  Min 2X/week        Progress Toward Goals  OT Goals(current goals can now be found in the care plan section)  Progress towards OT goals: Progressing toward goals  Acute Rehab OT Goals Patient Stated Goal: to get home  OT Goal Formulation: With patient/family Time For Goal Achievement: 09/17/18 Potential to Achieve Goals: Good  Plan Discharge plan remains appropriate;Frequency remains appropriate    Co-evaluation                 AM-PAC OT "6 Clicks" Daily Activity     Outcome Measure   Help from another person eating meals?: None Help from another person taking care of personal grooming?: A Little Help from another person toileting, which includes using toliet, bedpan, or urinal?: A Lot Help from another person bathing (including washing, rinsing, drying)?:  A Lot Help from another person to put on and taking off regular upper body clothing?: A Little Help from another person to put on and taking off regular lower body clothing?: A Lot 6 Click Score: 16    End of Session Equipment Utilized During Treatment: Gait belt  OT Visit Diagnosis: Other abnormalities of gait and mobility (R26.89);Muscle weakness (generalized) (M62.81)   Activity Tolerance Patient limited by fatigue   Patient Left in chair;with call bell/phone within reach;with chair alarm set   Nurse Communication Mobility status        Time: [phone] OT Time Calculation (min): 21 min  Charges: OT General Charges $OT Visit: 1 Visit OT Treatments $Self Care/Home Management : 8-22 mins  Chancy Milroy, OT Acute Rehabilitation Services Pager [phone] Office [phone]    Chancy Milroy 09/04/2018, 9:16 AM

## 2018-09-04 NOTE — Procedures (Signed)
Echo attempted. Nurse requested that I return later. Will attempt again.

## 2018-09-04 NOTE — Plan of Care (Signed)
  Problem: Education: Goal: Knowledge of disease or condition will improve Outcome: Progressing Goal: Knowledge of secondary prevention will improve Outcome: Progressing Goal: Knowledge of patient specific risk factors addressed and post discharge goals established will improve Outcome: Progressing   Problem: Self-Care: Goal: Ability to participate in self-care as condition permits will improve Outcome: Progressing Goal: Verbalization of feelings and concerns over difficulty with self-care will improve Outcome: Progressing

## 2018-09-04 NOTE — Progress Notes (Signed)
  Speech Language Pathology Treatment: Dysphagia  Patient Details Name: [name] MRN: [id number] DOB: 08/12/1920 Today's Date: 09/04/2018 Time: 1020-1030 SLP Time Calculation (min) (ACUTE ONLY): 10 min  Assessment / Plan / Recommendation Clinical Impression  Pt was seen for dyspahgia treatment to assess tolerance of the recommended diet. [name], Claiborne Billings, reported that the pt has been tolerating the current diet and medications without overt s/sx if aspiration and was able to feed herself during breakfast. Pt denied any difficulty with meals but stated that she has not had an appetite. Pt tolerated dysphagia 3 solids and thin liquids via cup and straw without symptoms of oropharyngeal dysphagia. It is recommended this this diet be continued and further SLP services are not clinically indicated at this time. Pt, her daughter, and nursing have been educated regarding this and all parties have verbalized understanding as well as agreement.    HPI HPI: Pt is an 85 y.o. female with past medical history of cardiomyopathy presents to the emergency room as a code stroke by EMS after home health nurse noticed patient suddenly stopped speaking and slumped over. CT of the head was negative for hemorrhage but showed old left PCA territory infarction. Patient received IV TPA and IR was completed on 09/02/18      SLP Plan  All goals met;Discharge SLP treatment due to (comment)(all goals met)       Recommendations  Diet recommendations: Dysphagia 3 (mechanical soft);Thin liquid Liquids provided via: Straw;Cup Medication Administration: Whole meds with puree Supervision: Patient able to self feed Compensations: Small sips/bites Postural Changes and/or Swallow Maneuvers: Seated upright 90 degrees                Follow up Recommendations: 24 hour supervision/assistance;None SLP Visit Diagnosis: Dysphagia, unspecified (R13.10) Plan: All goals met;Discharge SLP treatment due to (comment)(all  goals met)       Lion Fernandez I. Hardin Negus, [place], [place] Office number [phone] Pager [place] 09/04/2018, 10:54 AM

## 2018-09-04 NOTE — Progress Notes (Signed)
Physical Therapy Treatment Patient Details Name: [name] MRN: [id number] DOB: April 13, 1921 Today's Date: 09/04/2018    History of Present Illness Pt is a 85 year old female presents with aphasia and right hemiplegia secondary to left MCA stroke caused by M2 occlusion- per neurology.  Patient received IV TPA and was taken to IR for emergent mechanical thrombectomy on 09/02/18.  PMH: Cardiomyopathy, hernia repair.     PT Comments    Patient progressing with hallway ambulation this session.  Generalized weakness and c/o LE fatigue with HR up to 138 with ambulation.  Feel if she has assistance can safely d/c home with HHPT and family assist.    Follow Up Recommendations  Home health PT;Supervision/Assistance - 24 hour     Equipment Recommendations  None recommended by PT    Recommendations for Other Services       Precautions / Restrictions Precautions Precautions: Fall Precaution Comments: HOH Restrictions Weight Bearing Restrictions: No    Mobility  Bed Mobility Overal bed mobility: Needs Assistance Bed Mobility: Supine to Sit     Supine to sit: Supervision;HOB elevated     General bed mobility comments: up in chair  Transfers Overall transfer level: Needs assistance Equipment used: Rolling walker (2 wheeled) Transfers: Sit to/from Stand Sit to Stand: Min guard Stand pivot transfers: Min assist       General transfer comment: cues for hand placement as wanted to put hands on walker to stand, and to sit  Ambulation/Gait Ambulation/Gait assistance: Min assist Gait Distance (Feet): 70 Feet Assistive device: Rolling walker (2 wheeled) Gait Pattern/deviations: Step-through pattern;Decreased stride length     General Gait Details: HR max 138, pt c/o weakness in LE's, but noted no dyspnea or c/o light headedness, BP After ambulation 114/47; assist for balance, safety and cues for proximity to walker   Stairs             Wheelchair Mobility     Modified Rankin (Stroke Patients Only) Modified Rankin (Stroke Patients Only) Pre-Morbid Rankin Score: No symptoms Modified Rankin: Moderately severe disability     Balance Overall balance assessment: Needs assistance Sitting-balance support: No upper extremity supported;Feet supported Sitting balance-Leahy Scale: Good     Standing balance support: Bilateral upper extremity supported Standing balance-Leahy Scale: Poor Standing balance comment: needs UE support                            Cognition Arousal/Alertness: Awake/alert Behavior During Therapy: WFL for tasks assessed/performed Overall Cognitive Status: Within Functional Limits for tasks assessed                                        Exercises      General Comments General comments (skin integrity, edema, etc.): toileted in bathroom, able to perform hygiene, cues needed for hand placement with all transfers      Pertinent Vitals/Pain Pain Assessment: No/denies pain    Home Living     Available Help at Discharge: Personal care attendant;Family;Other (Comment)(Family working on 24-hour care) Type of Home: House              Prior Function            PT Goals (current goals can now be found in the care plan section) Acute Rehab PT Goals Patient Stated Goal: to get home  Progress towards PT goals: Progressing toward  goals    Frequency    Min 4X/week      PT Plan Current plan remains appropriate    Co-evaluation              AM-PAC PT "6 Clicks" Mobility   Outcome Measure  Help needed turning from your back to your side while in a flat bed without using bedrails?: A Little Help needed moving from lying on your back to sitting on the side of a flat bed without using bedrails?: A Little Help needed moving to and from a bed to a chair (including a wheelchair)?: A Little Help needed standing up from a chair using your arms (e.g., wheelchair or bedside chair)?:  A Little Help needed to walk in hospital room?: A Little Help needed climbing 3-5 steps with a railing? : A Lot 6 Click Score: 17    End of Session Equipment Utilized During Treatment: Gait belt Activity Tolerance: Patient limited by fatigue Patient left: with call bell/phone within reach;in chair;with chair alarm set   PT Visit Diagnosis: Other abnormalities of gait and mobility (R26.89);Muscle weakness (generalized) (M62.81)     Time: [id number] PT Time Calculation (min) (ACUTE ONLY): 28 min  Charges:  $Gait Training: 8-22 mins $Therapeutic Activity: 8-22 mins                     Leslie LawlessCyndi [name], [place] CarolinaPT Acute Rehabilitation Services [phone] 09/04/2018    [name] 09/04/2018, 10:46 AM

## 2018-09-04 NOTE — Progress Notes (Signed)
  Echocardiogram 2D Echocardiogram has been performed.  Gerda Diss 09/04/2018, 4:54 PM

## 2018-09-04 NOTE — Discharge Summary (Addendum)
Stroke Discharge Summary  Patient ID: [name]   MRN: [id number]      DOB: 10/20/1920  Date of Admission: 09/02/2018 Date of Discharge: 09/05/2018  Attending Physician:  Garvin Fila, MD, Stroke MD Consultant(s):    None  Patient's PCP:  Leslie Liberty, MD  DISCHARGE DIAGNOSIS:  Principal Problem:   Stroke due to embolism of left middle cerebral artery [facility]) Active Problems:   Essential hypertension   Acute blood loss anemia   Hyperlipemia   Cardiomyopathy ([place])   CKD (chronic kidney disease), stage III ([place])   Past Medical History:  Diagnosis Date  . Acid reflux   . Bladder disease   . Cardiomyopathy   . Constipation   . Diverticulosis   . Interstitial cystitis   . Renal disorder    Past Surgical History:  Procedure Laterality Date  . HERNIA REPAIR    . IR CT HEAD LTD  09/02/2018  . IR PERCUTANEOUS ART THROMBECTOMY/INFUSION INTRACRANIAL INC DIAG ANGIO  09/02/2018  . RADIOLOGY WITH ANESTHESIA N/A 09/02/2018   Procedure: IR WITH ANESTHESIA;  Surgeon: Luanne Bras, MD;  Location: [place];  Service: Radiology;  Laterality: N/A;    Allergies as of 09/05/2018   No Known Allergies     Medication List    STOP taking these medications   amLODipine 5 MG tablet Commonly known as:  NORVASC   docusate sodium 100 MG capsule Commonly known as:  COLACE   furosemide 20 MG tablet Commonly known as:  LASIX   polyethylene glycol packet Commonly known as:  MIRALAX / GLYCOLAX     TAKE these medications   aspirin 81 MG EC tablet Take 1 tablet (81 mg total) by mouth daily. Start taking on:  September 06, 2018   atorvastatin 10 MG tablet Commonly known as:  LIPITOR Take 1 tablet (10 mg total) by mouth daily at 6 PM.   clopidogrel 75 MG tablet Commonly known as:  PLAVIX Take 1 tablet (75 mg total) by mouth daily. Start taking on:  September 06, 2018   dorzolamide-timolol 22.3-6.8 MG/ML ophthalmic solution Commonly known as:  COSOPT Place 1 drop  into both eyes 2 (two) times daily.   FLEET ENEMA RE Place 1 application rectally 2 (two) times daily as needed (constipation).   HYDROcodone-acetaminophen 5-325 MG tablet Commonly known as:  NORCO/VICODIN Take 1 tablet by mouth every 4 (four) hours as needed for moderate pain. for pain   LINZESS 145 MCG Caps capsule Generic drug:  linaclotide Take 145 mcg by mouth every morning. As needed  for constipation   LORazepam 0.5 MG tablet Commonly known as:  ATIVAN Take 0.5 mg by mouth 2 (two) times daily as needed for anxiety or sleep. What changed:  Another medication with the same name was removed. Continue taking this medication, and follow the directions you see here.   omeprazole 20 MG tablet Commonly known as:  PRILOSEC OTC Take 20 mg by mouth daily as needed (acid reflux).   shark liver oil-cocoa butter 0.25-3-85.5 % suppository Commonly known as:  PREPARATION H Place 1 suppository rectally as needed for hemorrhoids.   timolol 0.25 % ophthalmic solution Commonly known as:  TIMOPTIC Place 1 drop into both eyes 2 (two) times daily.       LABORATORY STUDIES CBC    Component Value Date/Time   WBC 8.9 09/04/2018 0957   RBC 2.62 (L) 09/04/2018 0957   HGB 7.8 (L) 09/04/2018 0957   HCT 25.7 (L) 09/04/2018  0957   PLT 236 09/04/2018 0957   MCV 98.1 09/04/2018 0957   MCH 29.8 09/04/2018 0957   MCHC 30.4 09/04/2018 0957   RDW 17.0 (H) 09/04/2018 0957   LYMPHSABS 0.8 09/03/2018 0603   MONOABS 0.7 09/03/2018 0603   EOSABS 0.0 09/03/2018 0603   BASOSABS 0.0 09/03/2018 0603   CMP    Component Value Date/Time   NA 138 09/04/2018 0957   K 4.1 09/04/2018 0957   CL 112 (H) 09/04/2018 0957   CO2 16 (L) 09/04/2018 0957   GLUCOSE 130 (H) 09/04/2018 0957   BUN 26 (H) 09/04/2018 0957   CREATININE 1.68 (H) 09/04/2018 0957   CALCIUM 8.2 (L) 09/04/2018 0957   PROT 4.7 (L) 09/02/2018 1715   ALBUMIN 2.6 (L) 09/02/2018 1715   AST 20 09/02/2018 1715   ALT 14 09/02/2018 1715    ALKPHOS 38 09/02/2018 1715   BILITOT 0.5 09/02/2018 1715   GFRNONAA 26 (L) 09/04/2018 0957   GFRAA 30 (L) 09/04/2018 0957   COAGS Lab Results  Component Value Date   INR 1.00 09/02/2018   INR 1.0 05/29/2008   Lipid Panel    Component Value Date/Time   CHOL 176 09/03/2018 0603   TRIG 215 (H) 09/03/2018 0603   HDL 38 (L) 09/03/2018 0603   CHOLHDL 4.6 09/03/2018 0603   VLDL 43 (H) 09/03/2018 0603   LDLCALC 95 09/03/2018 0603   HgbA1C  Lab Results  Component Value Date   HGBA1C 5.1 09/03/2018   Urinalysis    Component Value Date/Time   COLORURINE YELLOW 11/07/2017 1423   APPEARANCEUR CLEAR 11/07/2017 1423   LABSPEC 1.014 11/07/2017 1423   PHURINE 6.0 11/07/2017 1423   GLUCOSEU NEGATIVE 11/07/2017 1423   HGBUR NEGATIVE 11/07/2017 1423   BILIRUBINUR NEGATIVE 11/07/2017 1423   KETONESUR NEGATIVE 11/07/2017 1423   PROTEINUR NEGATIVE 11/07/2017 1423   UROBILINOGEN 1.0 01/29/2017 1142   NITRITE NEGATIVE 11/07/2017 1423   LEUKOCYTESUR NEGATIVE 11/07/2017 1423   Urine Drug Screen No results found for: LABOPIA, COCAINSCRNUR, LABBENZ, AMPHETMU, THCU, LABBARB  Alcohol Level No results found for: [facility]   SIGNIFICANT DIAGNOSTIC STUDIES Ct Head Code Stroke Wo Contrast 09/02/2018 1. No acute finding by CT. Extensive chronic small-vessel ischemic changes throughout the brain. Old left PCA territory infarction. 2. ASPECTS is 10.   Ct Angio Head W Or Wo Contrast Ct Angio Neck W Or Wo Contrast 09/02/2018 1. Positive for ELVO: Occluded Left MCA M2 versus large M3. This is just beyond the left MCA bifurcation. This finding was communicated to Dr. Lorraine Lax at 1616 hours by text page via the [facility] messaging system, and also discussed between Drs. Shogry and Aroor. 2. Intracranial atherosclerosis with multiple other stenosis: - left ICA anterior genu/supraclinoid segment (moderate). - right MCA distal M1 or proximal M2 (moderate to severe). - bilateral PCA P2 segments (mild-to-moderate) and  distal branches (moderate to severe). 3. No significant carotid stenosis in the neck. Moderate proximal right vertebral artery stenosis in the neck due to plaque. 4.  Aortic Atherosclerosis (ICD10-I70.0). 5. Right submandibular gland 11 mm sialolith.   [place] Ir Percutaneous Art Thrombectomy/infusion Intracranial Inc Diag Angio 09/02/2018 Status post endovascular complete revascularization of occluded dominant inferior division of the left middle cerebral artery with 1 pass with the 5 mm x 33 mm Embotrap retrieval device achieving a TICI 3 revascularization.   Mr Brain Wo Contrast 09/03/2018 1. Negative for left MCA territory core infarct following revascularization. 2. There are several small right PICA territory  infarcts. No associated hemorrhage or mass effect. 3. Chronic left PCA territory encephalomalacia with hemosiderin.   2D Echocardiogram   1. The left ventricle has normal systolic function with an ejection fraction of 60-65%. The cavity size was normal. There is mildly increased left ventricular wall thickness. Left ventricular diastolic Doppler parameters are consistent with impaired  relaxation Elevated mean left atrial pressure.  2. The right ventricle has normal systolic function. The cavity was normal. There is no increase in right ventricular wall thickness. Right ventricular systolic pressure is mildly elevated with an estimated pressure of 35.0 mmHg.  3. Right atrial pressure is estimated at 3 mmHg.  4. The mitral valve is normal in structure. There is mild thickening. There is moderate mitral annular calcification present.  5. The tricuspid valve is normal in structure.  6. The aortic valve is tricuspid There is mild calcification of the aortic valve. Aortic valve regurgitation is trivial by color flow Doppler.  7. The pulmonic valve was normal in structure.  8. Normal LV systolic function; mild LVH with proximal septal thickening; mild diastolic dysfunction; sclerotic  aortic valve with trace AI; moderate MAC; trace TR with mild pulmonary hypertension.       HISTORY OF PRESENT ILLNESS [name] is an 84 y.o. female.  With past medical history of cardiomyopathy presents to the emergency room as a code stroke by EMS after home health nurse noticed patient suddenly stopped speaking and slumped over. She was last known well 09/02/2018 at 12:55 PM (LKW), when patient was with her home health nurse.  She was talking and suddenly became nonverbal, developed right facial droop.  When EMS arrived patient was noted to have left gaze deviation and right-sided hemiplegia.  Blood pressure was 322'G systolic. Patient arrived to [facility] emergency room, she was taken for stat CT head which showed no hemorrhage.  Patient received IV TPA.  CT angiogram was performed and showed a left M2 occlusion and after discussion with power of attorney regarding risk versus benefit of procedure patient was taken to IR for mechanical thrombectomy. At baseline, patient is independent and lives by herself at home with home health nurse visiting daily.  She has no history of dementia, walks with a cane. NIHSS: 29. Baseline MRS 1.    HOSPITAL COURSE Ms. KILAH DRAHOS is a 85 y.o. female with history of cardiomyopathy presenting with speech arrest with R facial, L gaze, R HP. Received tPA 2/11 at 1549. TICI3 revascularization occluded inferior division L MCA w/ mechanical thrombectomy. Speech much improved. HH therapy f/u recommended. Infarct felt to be d/t large vessel disease. Given large vessel intracranial atherosclerosis, treat with aspirin 81 mg and clopidogrel 75 mg orally every day x 3 months for secondary stroke prevention. After 3 months, change to plavix alone. Long-term dual antiplatelets are contraindicated due to risk for intracerebral hemorrhage.   Stroke:  left MCA infarct s/p tPA and IR,  Artery to artery emboli from large vessel disease vs cardioembolic source  Code  Stroke CT head No acute stroke. Old L PCA infarct. Small vessel disease. Atrophy. ASPECTS 10.     CTA head & neck ELVO L MCA M2. Intracranial atherosclerosis w/ mult stenosis (L ICA, R M1/M2, B PCA P2). Aortic atherosclerosis. R submandibular sialolith.  Cerebral angio TICI3 revascularization occluded inferior division L MCA w/ 1 pass embotrap  Post IR CT no hmg or mass effect  MRI   No L MCA infarct following revascularization. several small R PICA territory infarcts.  2D Echo  EF 60-65%. No source of embolus   LDL 95  HgbA1c 5.1  No antithrombotic prior to admission, now on aspirin 81 & Plavix 75 mg daily x 3 months then aspirin alone.   Therapy recommendations:  HH PT, HH OT, HH SLP  Disposition:  home  Acute Blood Loss Anemia  Bleeding from R groin incision, sheath  Mechanical closure by IR   Hgb 8.8->7.8  Follow up with PCP  Hypertension  Home meds:  norvasc 5, lasix 20  BP normotensive without home meds resumed  Low diastolics 27-06C treated during hospitalization with no change. Likely baseline  Continue to hold norvasc and lasix at d/c  BP goal normotensive  Follow up PCP in 1 week to follow up BP  Hyperlipidemia  Home meds:  No statin  LDL 95, goal < 70  Add low dose statin given age  Continue statin at discharge  Other Stroke Risk Factors  Advanced age  Cardiomyopathy  Other Active Problems  CKD stage III Cr 1.94   DISCHARGE EXAM Blood pressure (!) 122/46, pulse 76, temperature 97.6 F (36.4 C), temperature source Oral, resp. rate 18, height _0  (1.651 m), weight 56.7 kg, SpO2 98 %. General: Appears well-developed  Psych: Affect appropriate to situation Eyes: No scleral injection HENT: No OP obstrucion Head: Normocephalic.  Cardiovascular: Normal rate and regular rhythm.   Neurological Examination- unchanged Mental Status: Awake, alert oriented x 3, following any commands Cranial Nerves: II: Visual fields:Right  homonymous hemianopsia III,IV, VI: ptosis not present, deviation resolved, pupils equal, round, reactive to light and accommodation BJS:EGBTD facial droop XII: midline tongue extension Motor: Right :Upper extremity4/5Left: Upper extremity 4+/5 Lower extremity 4/5Lower extremity 4/5 Tone and bulk:normal tone throughout; no atrophy noted Sensory:Intact to light touch throughout  Deep Tendon Reflexes: 2+ and symmetric throughout Plantars: Right: downgoingLeft: downgoing Cerebellar: Intact finger to nose Gait:Unable to assess  Discharge Diet   Dysphagia 3 thin liquids  DISCHARGE PLAN  Disposition:  Home  Home health PT, OT, SLP   aspirin 81 mg daily and clopidogrel 75 mg daily for secondary stroke prevention x 3 months then aspirin alone  Ongoing risk factor control by Primary Care Physician at time of discharge  Follow-up Leslie Liberty, MD in 1 week for BP check and med adjustements if needed.  Follow-up in [facility] in 4 weeks, office to schedule an appointment.   35 minutes were spent preparing discharge.  Burnetta Sabin, MSN, APRN, ANVP-BC, AGPCNP-BC Advanced Practice Stroke Nurse [place] for Schedule & Pager information 09/05/2018 2:24 PM   I have personally obtained history,examined this patient, reviewed notes, independently viewed imaging studies, participated in medical decision making and plan of care.ROS completed by me personally and pertinent positives fully documented  I have made any additions or clarifications directly to the above note. Agree with note above.   Antony Contras, MD Medical Director [facility] Pager: [phone] 09/05/2018 4:05 PM

## 2018-09-04 NOTE — Progress Notes (Signed)
STROKE TEAM PROGRESS NOTE   INTERVAL HISTORY Patient up in chair, talkative, alert. Treated with fluid bolus last night for low diastolic pressures. Has remains steady and stable - likely her baseline.   Vitals:   09/04/18 0600 09/04/18 0700 09/04/18 0800 09/04/18 0900  BP: (!) 117/47 (!) 111/46 (!) 113/46 (!) 124/42  Pulse: 84 85 89 86  Resp: 14 13 15  (!) 25  Temp:   98.3 F (36.8 C)   TempSrc:   Oral   SpO2: 99% 98% 100% (!) 77%  Weight:      Height:        CBC:  Recent Labs  Lab 09/02/18 1715 09/03/18 0603 09/04/18 0957  WBC 4.0 13.0* 8.9  NEUTROABS 2.3 11.4*  --   HGB 8.5* 8.8* 7.8*  HCT 27.2* 27.8* 25.7*  MCV 95.1 96.9 98.1  PLT 230 278 236    Basic Metabolic Panel:  Recent Labs  Lab 09/03/18 0603 09/04/18 0957  NA 134* 138  K 4.4 4.1  CL 106 112*  CO2 19* 16*  GLUCOSE 126* 130*  BUN 33* 26*  CREATININE 1.94* 1.68*  CALCIUM 7.9* 8.2*   Lipid Panel:     Component Value Date/Time   CHOL 176 09/03/2018 0603   TRIG 215 (H) 09/03/2018 0603   HDL 38 (L) 09/03/2018 0603   CHOLHDL 4.6 09/03/2018 0603   VLDL 43 (H) 09/03/2018 0603   LDLCALC 95 09/03/2018 0603   HgbA1c:  Lab Results  Component Value Date   HGBA1C 5.1 09/03/2018   Urine Drug Screen: No results found for: LABOPIA, COCAINSCRNUR, LABBENZ, AMPHETMU, THCU, LABBARB  Alcohol Level No results found for: ETH  IMAGING Ct Angio Head W Or Wo Contrast  Result Date: 09/02/2018 CLINICAL DATA:  85 year old female code stroke with right side weakness and unable to talk. EXAM: CT ANGIOGRAPHY HEAD AND NECK TECHNIQUE: Multidetector CT imaging of the head and neck was performed using the standard protocol during bolus administration of intravenous contrast. Multiplanar CT image reconstructions and MIPs were obtained to evaluate the vascular anatomy. Carotid stenosis measurements (when applicable) are obtained utilizing NASCET criteria, using the distal internal carotid diameter as the denominator. CONTRAST:   75mL ISOVUE-370 IOPAMIDOL (ISOVUE-370) INJECTION 76% COMPARISON:  CT head 1541 hours today. FINDINGS: CTA NECK Skeleton: No acute osseous abnormality identified. Upper chest: Negative visible lungs and superior mediastinum. Other neck: Thyroid goiter. Large 11 millimeter right submandibular gland sialolith. Aortic arch: Heavily calcified.  Three vessel arch configuration. Right carotid system: No brachiocephalic or right CCA origin stenosis. Minimal plaque at the right carotid bifurcation and no cervical right ICA stenosis. Left carotid system: No left CCA origin stenosis. Heavily calcified left ECA origin, but the left ICA origin and bulb are relatively spared and there is no cervical left ICA stenosis. Vertebral arteries: Plaque in the proximal right subclavian artery appears mildly ulcerated but there is no significant stenosis. Likewise, calcified plaque at the left vertebral origin and V1 segment with up to moderate stenosis which is maximal on series 6, image 583 in the distal V1. The right vertebral is then patent to the skull base without additional stenosis. Up to 50% stenosis of the proximal left subclavian artery due to multifocal soft and calcified plaque. Calcification adjacent to the left vertebral artery origin, but no associated stenosis. Mild left V1 and V3 calcified plaque without stenosis. CTA HEAD Posterior circulation: Codominant distal vertebral arteries are patent without stenosis to the vertebrobasilar junction. Patent right PICA and dominant appearing left AICA  origins. Patent basilar artery with mild irregularity and no significant stenosis. Patent SCA and PCA origins. Mild left P1 origin stenosis. Mild to moderate bilateral P2 segment stenosis. At least moderate bilateral P3 segment stenosis, up to severe on the left (series 12, image 25). Anterior circulation: Both ICA siphons are patent but calcified. On the right there is no significant siphon stenosis. On the left there is mild to  moderate anterior genu and proximal supraclinoid segment stenosis. Patent carotid termini. Patent MCA and ACA origins. Anterior communicating artery and bilateral ACA branches are within normal limits. Right MCA M1 is patent. There is an early bifurcation or prominent right anterior temporal artery. Just distal to this there is moderate to severe stenosis proximal to trifurcation on series 10, image 19 and series 11, image 21. Right MCA branches are otherwise mildly irregular. Left MCA M1 and bifurcation are patent. There is an occluded middle sylvian division M3 branch best seen on series 12, image 30 which is just beyond the bifurcation. There is superimposed moderate irregularity and stenosis of the posterior M2 or M3 (same image). Poor collaterals suggested on these images. Venous sinuses: Not well evaluated due to early contrast phase. Anatomic variants: None. Review of the MIP images confirms the above findings IMPRESSION: 1. Positive for ELVO: Occluded Left MCA M2 versus large M3. This is just beyond the left MCA bifurcation. This finding was communicated to Dr. Laurence Slate at 1616 hours by text page via the [facility] messaging system, and also discussed between Drs. Shogry and Aroor. 2. Intracranial atherosclerosis with multiple other stenosis: - left ICA anterior genu/supraclinoid segment (moderate). - right MCA distal M1 or proximal M2 (moderate to severe). - bilateral PCA P2 segments (mild-to-moderate) and distal branches (moderate to severe). 3. No significant carotid stenosis in the neck. Moderate proximal right vertebral artery stenosis in the neck due to plaque. 4.  Aortic Atherosclerosis (ICD10-I70.0). 5. Right submandibular gland 11 mm sialolith. Electronically Signed   By: Odessa Fleming M.D.   On: 09/02/2018 16:32   Ct Angio Neck W Or Wo Contrast  Result Date: 09/02/2018 CLINICAL DATA:  85 year old female code stroke with right side weakness and unable to talk. EXAM: CT ANGIOGRAPHY HEAD AND NECK TECHNIQUE:  Multidetector CT imaging of the head and neck was performed using the standard protocol during bolus administration of intravenous contrast. Multiplanar CT image reconstructions and MIPs were obtained to evaluate the vascular anatomy. Carotid stenosis measurements (when applicable) are obtained utilizing NASCET criteria, using the distal internal carotid diameter as the denominator. CONTRAST:  75mL ISOVUE-370 IOPAMIDOL (ISOVUE-370) INJECTION 76% COMPARISON:  CT head 1541 hours today. FINDINGS: CTA NECK Skeleton: No acute osseous abnormality identified. Upper chest: Negative visible lungs and superior mediastinum. Other neck: Thyroid goiter. Large 11 millimeter right submandibular gland sialolith. Aortic arch: Heavily calcified.  Three vessel arch configuration. Right carotid system: No brachiocephalic or right CCA origin stenosis. Minimal plaque at the right carotid bifurcation and no cervical right ICA stenosis. Left carotid system: No left CCA origin stenosis. Heavily calcified left ECA origin, but the left ICA origin and bulb are relatively spared and there is no cervical left ICA stenosis. Vertebral arteries: Plaque in the proximal right subclavian artery appears mildly ulcerated but there is no significant stenosis. Likewise, calcified plaque at the left vertebral origin and V1 segment with up to moderate stenosis which is maximal on series 6, image 583 in the distal V1. The right vertebral is then patent to the skull base without additional stenosis. Up to  50% stenosis of the proximal left subclavian artery due to multifocal soft and calcified plaque. Calcification adjacent to the left vertebral artery origin, but no associated stenosis. Mild left V1 and V3 calcified plaque without stenosis. CTA HEAD Posterior circulation: Codominant distal vertebral arteries are patent without stenosis to the vertebrobasilar junction. Patent right PICA and dominant appearing left AICA origins. Patent basilar artery with mild  irregularity and no significant stenosis. Patent SCA and PCA origins. Mild left P1 origin stenosis. Mild to moderate bilateral P2 segment stenosis. At least moderate bilateral P3 segment stenosis, up to severe on the left (series 12, image 25). Anterior circulation: Both ICA siphons are patent but calcified. On the right there is no significant siphon stenosis. On the left there is mild to moderate anterior genu and proximal supraclinoid segment stenosis. Patent carotid termini. Patent MCA and ACA origins. Anterior communicating artery and bilateral ACA branches are within normal limits. Right MCA M1 is patent. There is an early bifurcation or prominent right anterior temporal artery. Just distal to this there is moderate to severe stenosis proximal to trifurcation on series 10, image 19 and series 11, image 21. Right MCA branches are otherwise mildly irregular. Left MCA M1 and bifurcation are patent. There is an occluded middle sylvian division M3 branch best seen on series 12, image 30 which is just beyond the bifurcation. There is superimposed moderate irregularity and stenosis of the posterior M2 or M3 (same image). Poor collaterals suggested on these images. Venous sinuses: Not well evaluated due to early contrast phase. Anatomic variants: None. Review of the MIP images confirms the above findings IMPRESSION: 1. Positive for ELVO: Occluded Left MCA M2 versus large M3. This is just beyond the left MCA bifurcation. This finding was communicated to Dr. Laurence Slate at 1616 hours by text page via the [facility] messaging system, and also discussed between Drs. Shogry and Aroor. 2. Intracranial atherosclerosis with multiple other stenosis: - left ICA anterior genu/supraclinoid segment (moderate). - right MCA distal M1 or proximal M2 (moderate to severe). - bilateral PCA P2 segments (mild-to-moderate) and distal branches (moderate to severe). 3. No significant carotid stenosis in the neck. Moderate proximal right vertebral  artery stenosis in the neck due to plaque. 4.  Aortic Atherosclerosis (ICD10-I70.0). 5. Right submandibular gland 11 mm sialolith. Electronically Signed   By: Odessa Fleming M.D.   On: 09/02/2018 16:32   Mr Brain Wo Contrast  Result Date: 09/03/2018 CLINICAL DATA:  85 year old female s/p endovascular treatment yesterday for emergent large vessel occlusion of left M2 versus large M3. EXAM: MRI HEAD WITHOUT CONTRAST TECHNIQUE: Multiplanar, multiecho pulse sequences of the brain and surrounding structures were obtained without intravenous contrast. COMPARISON:  CTA head and neck yesterday.  Brain MRI 09/26/2010. FINDINGS: Brain: There is no convincing left MCA territory restricted diffusion. No abnormal cortical signal in the left MCA territory. There is suggestion of a few small foci of restricted diffusion in the right cerebellum PICA territory (series 3, image 7 and 10). Faint T2 hyperintensity. No acute intracranial hemorrhage identified. There is chronic hemosiderin and encephalomalacia in the left PCA territory. Otherwise bilateral cerebral white matter and pontine T2 and FLAIR hyperintensity has mildly progressed since 2012. No midline shift, mass effect, or evidence of intracranial mass lesion. Vascular: Major intracranial vascular flow voids are stable compared to 2012. Skull and upper cervical spine: Negative visible cervical spine. Visualized bone marrow signal is within normal limits. Sinuses/Orbits: Stable and negative. Other: Mastoids remain clear. Visible internal auditory structures appear normal. Scalp  and face soft tissues appear negative. IMPRESSION: 1. Negative for left MCA territory core infarct following revascularization. 2. There are several small right PICA territory infarcts. No associated hemorrhage or mass effect. 3. Chronic left PCA territory encephalomalacia with hemosiderin. Electronically Signed   By: Odessa Fleming M.D.   On: 09/03/2018 15:46   Ir Ct Head Ltd  Result Date:  09/04/2018 INDICATION: New onset expressive aphasia, left gaze deviation, and right-sided hemiplegia. Occluded dominant inferior division of the left middle cerebral artery on recent CT angiogram of the head and neck. EXAM: 1. EMERGENT LARGE VESSEL OCCLUSION THROMBOLYSIS (anterior CIRCULATION) COMPARISON:  CT angiogram of the head and neck 09/02/2018. MEDICATIONS: Ancef 2 g IV antibiotic was administered within 1 hour of the procedure. ANESTHESIA/SEDATION: General anesthesia. CONTRAST:  Isovue 300 approximately 50 mL. FLUOROSCOPY TIME:  Fluoroscopy Time: 22 minutes 6 seconds (1576 mGy). COMPLICATIONS: None immediate. TECHNIQUE: Following a full explanation of the procedure along with the potential associated complications, an informed witnessed consent was obtained from the patient's caregiver and power of attorney. The risks of intracranial hemorrhage of 10%, worsening neurological deficit, ventilator dependency, death and inability to revascularize were all reviewed in detail with the patient's caregiver. The patient was then put under general anesthesia by the Department of Anesthesiology at [facility]. The right groin was prepped and draped in the usual sterile fashion. Thereafter using modified Seldinger technique, transfemoral access into the right common femoral artery was obtained without difficulty. Over a 0.035 inch guidewire a 5 French Pinnacle sheath was inserted. Through this, and also over a 0.035 inch guidewire a 5 [place] JB 1 catheter was advanced to the aortic arch region and selectively positioned in the left common carotid artery. FINDINGS: The left common carotid arteriogram demonstrates the left external carotid artery and its major branches to be widely patent. A smooth atherosclerotic plaque is noted at the anterior wall of the left common carotid artery at the bifurcation. The left internal carotid artery at the bulb to the cranial skull base demonstrates wide patency. Mild FMD-like  changes are noted in the mid cervical left ICA. No significant stenosis is noted. The petrous, cavernous and supraclinoid segments are widely patent. The left anterior cerebral artery opacifies into the capillary and venous phases. The left middle cerebral artery M1 segment is patent. There is complete angiographic occlusion of the dominant inferior division of the left middle cerebral artery with a large area of hypoperfusion on the delayed lateral capillary and delayed arterial phases. PROCEDURE: The diagnostic JB 1 catheter in the left common carotid artery was exchanged over a 0.035 inch 300 cm Rosen exchange guidewire for an 8 French 55 cm Brite tip neurovascular sheath using biplane roadmap technique and constant fluoroscopic guidance. Good aspiration was obtained from the side port of the neurovascular sheath. This was then connected to continuous heparinized saline infusion. Over the [employer] guidewire, an 8 [place] 85 cm FlowGate balloon guide catheter which had been prepped with 50% contrast and 50% heparinized saline infusion was advanced and positioned just proximal to the left common carotid bifurcation. The guidewire was removed. Good aspiration obtained from the [facility] guide catheter. A gentle control arteriogram performed through the [facility] guide catheter demonstrated no change in the intracranial circulation. This was then advanced over a 0.035 inch Roadrunner guidewire to the junction of the middle and the proximal 1/3 of the left ICA. Over a 0.014 inch Softip Synchro micro guidewire, a combination of a 115 cm 5 [place] intermediate Catalyst guide  catheter were inside of which was an 021 Trevo ProVue microcatheter was advanced and guided to the cavernous segment of the left internal carotid artery. With the micro guidewire leading with a J-tip configuration, the combination was navigated without difficulty into the left middle cerebral artery. The wire was then manipulated gently to  advanced through the occluded dominant inferior division into the M2 M3 region followed by the microcatheter. The guidewire was removed. Good aspiration obtained from the hub of the microcatheter. A gentle control arteriogram performed through the microcatheter demonstrated safe position of tip of the microcatheter. This was then connected to continuous heparinized saline infusion. A 5 French intermediate guide catheter was advanced to the proximal M1 segment of the left middle cerebral artery. At this time, a 5 mm x 33 mm Embotrap retrieval device was advanced in a coaxial manner and with constant heparinized saline infusion to the distal end of the microcatheter. The proximal and the distal landing zones were then defined. The O ring on the delivery microcatheter was loosened. With slight forward gentle traction with the right hand on the delivery micro guidewire, the delivery microcatheter was retrieved unsheathing the distal and then the proximal portion of the retrieval device. A gentle control arteriogram performed through the 5 [place] intermediary catheter in the proximal left middle cerebral artery demonstrated a TICI 3 revascularization. With proximal flow arrest in the left internal carotid artery by inflating the balloon of the [facility] guide catheter, the combination of the retrieval device, the microcatheter and the 5 [place] intermediate catheter was retrieved as constant aspiration was applied with a 60 mL syringe at the hub of the [facility] guide catheter, and with a Penumbra aspiration device at the hub of the 5 [place] intermediate catheter. These combinations were retrieved and removed. Balloon was then deflated as constant aspiration was continued with the 60 mL syringe at the hub of the [facility] guide catheter. Free back bleed of blood was seen. A gentle control arteriogram performed through the [facility] guide catheter in the left internal carotid artery demonstrated complete angiographic  revascularization of the previously occluded dominant inferior division. A revascularization had been established. No evidence of internal luminal filling defects, or of vasospasm, or mass-effect or midline shift was noted. No evidence of extravasation was noted. The patient's hemodynamic and neurological status remained stable. An 8 [place] FlowGate guide catheter, and the 8 [place] Brite tip neurovascular sheath were then retrieved and removed over a 0.035 inch J-tip guidewire in place without an 8 [place] Pinnacle sheath. This in turn was then connected to continuous heparinized saline infusion. An 8 French sheath was left in situ because of underlying atherosclerotic disease. The right groin was soft without evidence of a hematoma or bleeding. Distal pulses were Dopplerable in the posterior tibial and dorsalis pedis arteries bilaterally. Immediate CT of the brain demonstrated no evidence of a hemorrhage, mass effect or midline shift. Patient was then extubated without difficulty. Upon recovery, the patient was moving all 4 extremities right arm and leg less so. She was able to obey simple commands appropriately. She continued to have difficulties with expression. She was transported to the neuro ICU to continue with post thrombectomy management. IMPRESSION: Status post endovascular complete revascularization of occluded dominant inferior division of the left middle cerebral artery with 1 pass with the 5 mm x 33 mm Embotrap retrieval device achieving a TICI 3 revascularization. PLAN: Follow-up 4 weeks post discharge. Electronically Signed   By: Julieanne Cotton M.D.   On: 09/03/2018 16:39  Ir Percutaneous Art Thrombectomy/infusion Intracranial Inc Diag Angio  Result Date: 09/04/2018 INDICATION: New onset expressive aphasia, left gaze deviation, and right-sided hemiplegia. Occluded dominant inferior division of the left middle cerebral artery on recent CT angiogram of the head and neck. EXAM: 1. EMERGENT LARGE  VESSEL OCCLUSION THROMBOLYSIS (anterior CIRCULATION) COMPARISON:  CT angiogram of the head and neck 09/02/2018. MEDICATIONS: Ancef 2 g IV antibiotic was administered within 1 hour of the procedure. ANESTHESIA/SEDATION: General anesthesia. CONTRAST:  Isovue 300 approximately 50 mL. FLUOROSCOPY TIME:  Fluoroscopy Time: 22 minutes 6 seconds (1576 mGy). COMPLICATIONS: None immediate. TECHNIQUE: Following a full explanation of the procedure along with the potential associated complications, an informed witnessed consent was obtained from the patient's caregiver and power of attorney. The risks of intracranial hemorrhage of 10%, worsening neurological deficit, ventilator dependency, death and inability to revascularize were all reviewed in detail with the patient's caregiver. The patient was then put under general anesthesia by the [facility]. The right groin was prepped and draped in the usual sterile fashion. Thereafter using modified Seldinger technique, transfemoral access into the right common femoral artery was obtained without difficulty. Over a 0.035 inch guidewire a 5 French Pinnacle sheath was inserted. Through this, and also over a 0.035 inch guidewire a 5 [place] JB 1 catheter was advanced to the aortic arch region and selectively positioned in the left common carotid artery. FINDINGS: The left common carotid arteriogram demonstrates the left external carotid artery and its major branches to be widely patent. A smooth atherosclerotic plaque is noted at the anterior wall of the left common carotid artery at the bifurcation. The left internal carotid artery at the bulb to the cranial skull base demonstrates wide patency. Mild FMD-like changes are noted in the mid cervical left ICA. No significant stenosis is noted. The petrous, cavernous and supraclinoid segments are widely patent. The left anterior cerebral artery opacifies into the capillary and venous phases. The left  middle cerebral artery M1 segment is patent. There is complete angiographic occlusion of the dominant inferior division of the left middle cerebral artery with a large area of hypoperfusion on the delayed lateral capillary and delayed arterial phases. PROCEDURE: The diagnostic JB 1 catheter in the left common carotid artery was exchanged over a 0.035 inch 300 cm Rosen exchange guidewire for an 8 French 55 cm Brite tip neurovascular sheath using biplane roadmap technique and constant fluoroscopic guidance. Good aspiration was obtained from the side port of the neurovascular sheath. This was then connected to continuous heparinized saline infusion. Over the [employer] exchange guidewire, an 8 [place] 85 cm FlowGate balloon guide catheter which had been prepped with 50% contrast and 50% heparinized saline infusion was advanced and positioned just proximal to the left common carotid bifurcation. The guidewire was removed. Good aspiration [facility]. A gentle control arteriogram performed [facility] the [facility] demonstrated no change in the intracranial circulation. This was then advanced over a 0.035 inch Roadrunner guidewire to the junction of the middle and the proximal 1/3 of the left ICA. Over a 0.014 inch Softip Synchro micro guidewire, a combination of a 115 cm 5 [place] intermediate Catalyst guide catheter were inside of which was an [employer]021 Trevo ProVue microcatheter was advanced and guided to the cavernous segment of the left internal carotid artery. With the micro guidewire leading with a J-tip configuration, the combination was navigated without difficulty into the left middle cerebral artery. The wire was then manipulated gently  to advanced through the occluded dominant inferior division into the M2 M3 region followed by the microcatheter. The guidewire was removed. Good aspiration obtained from the hub of the microcatheter. A gentle control arteriogram performed through the  microcatheter demonstrated safe position of tip of the microcatheter. This was then connected to continuous heparinized saline infusion. A 5 French intermediate guide catheter was advanced to the proximal M1 segment of the left middle cerebral artery. At this time, a 5 mm x 33 mm Embotrap retrieval device was advanced in a coaxial manner and with constant heparinized saline infusion to the distal end of the microcatheter. The proximal and the distal landing zones were then defined. The O ring on the delivery microcatheter was loosened. With slight forward gentle traction with the right hand on the delivery micro guidewire, the delivery microcatheter was retrieved unsheathing the distal and then the proximal portion of the retrieval device. A gentle control arteriogram performed through the 5 [place] intermediary catheter in the proximal left middle cerebral artery demonstrated a TICI 3 revascularization. With proximal flow arrest in the left internal carotid artery by inflating the balloon of the [facility] guide catheter, the combination of the retrieval device, the microcatheter and the 5 [place] intermediate catheter was retrieved as constant aspiration was applied with a 60 mL syringe at the hub of the [facility] guide catheter, and with a Penumbra aspiration device at the hub of the 5 [place] intermediate catheter. These combinations were retrieved and removed. Balloon was then deflated as constant aspiration was continued with the 60 mL syringe at the hub of the [employer] guide catheter. Free back bleed of blood was seen. A gentle control arteriogram performed through the [facility] guide catheter in the left internal carotid artery demonstrated complete angiographic revascularization of the previously occluded dominant inferior division. A revascularization had been established. No evidence of internal luminal filling defects, or of vasospasm, or mass-effect or midline shift was noted. No evidence of extravasation was  noted. The patient's hemodynamic and neurological status remained stable. An 8 [place] FlowGate guide catheter, and the 8 [place] Brite tip neurovascular sheath were then retrieved and removed over a 0.035 inch J-tip guidewire in place without an 8 [place] Pinnacle sheath. This in turn was then connected to continuous heparinized saline infusion. An 8 French sheath was left in situ because of underlying atherosclerotic disease. The right groin was soft without evidence of a hematoma or bleeding. Distal pulses were Dopplerable in the posterior tibial and dorsalis pedis arteries bilaterally. Immediate CT of the brain demonstrated no evidence of a hemorrhage, mass effect or midline shift. Patient was then extubated without difficulty. Upon recovery, the patient was moving all 4 extremities right arm and leg less so. She was able to obey simple commands appropriately. She continued to have difficulties with expression. She was transported to the neuro ICU to continue with post thrombectomy management. IMPRESSION: Status post endovascular complete revascularization of occluded dominant inferior division of the left middle cerebral artery with 1 pass with the 5 mm x 33 mm Embotrap retrieval device achieving a TICI 3 revascularization. PLAN: Follow-up 4 weeks post discharge. Electronically Signed   By: Julieanne Cotton M.D.   On: 09/03/2018 16:39   Ct Head Code Stroke Wo Contrast  Result Date: 09/02/2018 CLINICAL DATA:  Code stroke. Last seen normal 1455 hours. Acute speech disturbance and right-sided weakness. EXAM: CT HEAD WITHOUT CONTRAST TECHNIQUE: Contiguous axial images were obtained from the base of the skull through the vertex without intravenous contrast. COMPARISON:  10/03/2013 FINDINGS: Brain: Chronic small-vessel ischemic changes affect the pons, with wall air in degeneration on the left. No focal cerebellar insult. Old infarction in the left PCA territory affecting the posteromedial temporal lobe and  occipital lobe. Chronic small-vessel ischemic changes affect the thalami and basal ganglia regions. I do not see evidence of acute infarction, mass lesion, hemorrhage, hydrocephalus or extra-axial collection. Vascular: There is atherosclerotic calcification of the major vessels at the base of the brain. Skull: Negative Sinuses/Orbits: Clear/normal Other: None ASPECTS ([place]berta Stroke Program Early CT Score) - Ganglionic level infarction (caudate, lentiform nuclei, internal capsule, insula, M1-M3 cortex): 7 - Supraganglionic infarction (M4-M6 cortex): 3 Total score (0-10 with 10 being normal): 10 IMPRESSION: 1. No acute finding by CT. Extensive chronic small-vessel ischemic changes throughout the brain. Old left PCA territory infarction. 2. ASPECTS is 10. 3. These results were communicated to Dr. Laurence Slate at 3:47 pmon 2/11/2020by text page via the [facility] messaging system. Electronically Signed   By: Paulina Fusi M.D.   On: 09/02/2018 15:51   Cerebral Angio S/P Lt common carotid arteriogram followed by complete revascularization of occluded dominant inferior division with x 1 pass with embotrap x 33 mm retriever  device  Achieving a TICI 3 revascularization.   PHYSICAL EXAM General: Appears well-developed  Psych: Affect appropriate to situation Eyes: No scleral injection HENT: No OP obstrucion Head: Normocephalic.  Cardiovascular: Normal rate and regular rhythm.   Neurological Examination- unchanged Mental Status: Awake, alert oriented x 3, following any commands Cranial Nerves: II: Visual fields : Right homonymous hemianopsia III,IV, VI: ptosis not present, deviation resolved, pupils equal, round, reactive to light and accommodation VII: Right facial droop XII: midline tongue extension Motor: Right :  Upper extremity   4/5                                      Left:     Upper extremity   4+/5             Lower extremity   4/5                                                  Lower extremity    4/5 Tone and bulk:normal tone throughout; no atrophy noted Sensory: Intact to light touch throughout  Deep Tendon Reflexes: 2+ and symmetric throughout Plantars: Right: downgoing                                Left: downgoing Cerebellar: Intact finger to nose Gait: Unable to assess   ASSESSMENT/PLAN Ms. NAKETA DADDARIO is a 85 y.o. female with history of cardiomyopathy presenting with speech arrest with R facial, L gaze, R HP. Received tPA 2/11 at 1549.  Stroke:  left MCA infarct s/p tPA and IR,  Artery to artery emboli from large vessel disease vs cardioembolic source  Code Stroke CT head No acute stroke. Old L PCA infarct. Small vessel disease. Atrophy. ASPECTS 10.     CTA head & neck ELVO L MCA M2. Intracranial atherosclerosis w/ mult stenosis (L ICA, R M1/M2, B PCA P2). Aortic atherosclerosis. R submandibular sialolith.  Post IR CT no hmg or mass effect  MRI   No  L MCA infarct following revascularization. several small R PICA territory infarcts.   2D Echo  pending   LDL 95  HgbA1c 5.1  SCDs for VTE prophylaxis No antithrombotic prior to admission, now on aspirin 325 mg daily. Will decrease dose to 81 mg daily and continue at d/c  Therapy recommendations:  HH PT, HH OT  Disposition:  pending   Transfer to the floor. Anticipate d/c home tomorrow  Acute Blood Loss Anemia  Bleeding from R groin incision, sheath  Mechanical closure by IR   Hgb 8.8->7.8  Hypertension  Home meds:  norvasc 5, lasic 20 . SBP goal < 160 . Low diastolics 30-40s treated during hospitalization with no change. Likely baseline . Long-term BP goal normotensive  Hyperlipidemia  Home meds:  No statin  LDL 95, goal < 70  Add low dose statin given age  Continue statin at discharge  Other Stroke Risk Factors  Advanced age  Cardiomyopathy  Other Active Problems  CKD stage III Cr 1.94  Hospital day # 2  Annie MainSharon Biby, MSN, APRN, ANVP-BC, AGPCNP-BC Advanced Practice Stroke  Nurse [facility] See Amion for Schedule & Pager information 09/04/2018 12:09 PM   S/p TPA and IR Doing very well MRI shows small PCA strokes, No MCA strokes ASA 81mg  daily and Plavix given multiple areas of intracranial arthero as per sampris trial plus statin Most likely a artery to artery embolism     To contact Stroke Continuity provider, please refer to [email or web address]. After hours, contact General Neurology

## 2018-09-04 NOTE — Progress Notes (Signed)
On-call cross coverage note  Called earlier in the shift by the patient RN reporting the low MAPs.  Ordered fluid bolus without much change.  Per RN, no change in exam.  Diastolics running in 30s to 40s at times.  Echocardiogram is still pending.  Approached again by the patient's RN as well as the unit charge nurse to transfer patient out of the unit.  Given diastolics in 30s to 40s and low maps, I do not feel it is a safe transfer the patient to telemetry floor in the middle of the night.  She requires close neuro monitoring in the neuro ICU and a decision of transfer to be made by the stroke team when they round on her, as they are more familiar with her care and clinical course.  Please call with any questions.  -- Milon Dikes, MD [facility] Pager: [phone] If 7pm to 7am, please call on call as listed on AMION.

## 2018-09-04 NOTE — Evaluation (Signed)
Speech Language Pathology Evaluation Patient Details Name: [name] MRN: [id number] DOB: Aug 24, 1920 Today's Date: 09/04/2018 Time: 1005-1020 SLP Time Calculation (min) (ACUTE ONLY): 15 min  Problem List:  Patient Active Problem List   Diagnosis Date Noted  . Stroke due to embolism of left middle cerebral artery (HCC) 09/02/2018  . Middle cerebral artery embolism, left 09/02/2018  . Dehydration 01/29/2018  . Slow transit constipation 01/29/2018  . Essential hypertension 01/29/2018  . Constipation 01/29/2018   Past Medical History:  Past Medical History:  Diagnosis Date  . Acid reflux   . Bladder disease   . Cardiomyopathy   . Constipation   . Diverticulosis   . Interstitial cystitis   . Renal disorder    Past Surgical History:  Past Surgical History:  Procedure Laterality Date  . HERNIA REPAIR    . RADIOLOGY WITH ANESTHESIA N/A 09/02/2018   Procedure: IR WITH ANESTHESIA;  Surgeon: Julieanne Cotton, MD;  Location: [facility] OR;  Service: Radiology;  Laterality: N/A;   HPI:  Pt is an 85 y.o. female with past medical history of cardiomyopathy presents to the emergency room as a code stroke by EMS after home health nurse noticed patient suddenly stopped speaking and slumped over. CT of the head was negative for hemorrhage but showed old left PCA territory infarction. Patient received IV TPA and IR was completed on 09/02/18   Assessment / Plan / Recommendation Clinical Impression  Pt was seen for speech/language evaluation with one of her daughters present. Pt's daughter reported that the pt's speech and language skills appear to be back to baseline. Today's assessment revealed speech and language skills which are within normal limtis. Her cognitive skills appear to be within functional limits considering her home environment and level of support. Speech/language treatment is not clinically indicated at this time.     SLP Assessment  SLP Visit Diagnosis: Dysphagia, unspecified  (R13.10)    Follow Up Recommendations  None;24 hour supervision/assistance    Frequency and Duration         SLP Evaluation Cognition  Overall Cognitive Status: Within Functional Limits for tasks assessed Arousal/Alertness: Awake/alert Orientation Level: Oriented X4 Attention: Focused Focused Attention: Appears intact Awareness: Appears intact Problem Solving: Appears intact Executive Function: Reasoning Reasoning: Impaired(2/3) Reasoning Impairment: Verbal complex       Comprehension  Auditory Comprehension Overall Auditory Comprehension: Appears within functional limits for tasks assessed Yes/No Questions: Within Functional Limits(Simple: 5/5; Complex: 4/5) Commands: Within Functional Limits(2-step: 4/4; 3-step: 4/4) Conversation: Complex Reading Comprehension Reading Status: Not tested    Expression Expression Primary Mode of Expression: Verbal Verbal Expression Overall Verbal Expression: Appears within functional limits for tasks assessed Initiation: No impairment Level of Generative/Spontaneous Verbalization: Sentence;Conversation Repetition: No impairment(Sentence: 5/5) Naming: No impairment(10/10) Pragmatics: No impairment   Oral / Motor  Oral Motor/Sensory Function Overall Oral Motor/Sensory Function: Within functional limits Motor Speech Overall Motor Speech: Appears within functional limits for tasks assessed Respiration: Within functional limits Phonation: Normal(Occasionally hoarse vocal quality) Resonance: Within functional limits Articulation: Within functional limitis Intelligibility: Intelligible Motor Planning: Witnin functional limits Motor Speech Errors: Not applicable   Audrey Eller I. Vear Clock, MS, CCC-SLP Acute Rehabilitation Services Office number [phone] Pager [phone]                   Scheryl Marten 09/04/2018, 10:46 AM

## 2018-09-05 LAB — POCT I-STAT 4, (NA,K, GLUC, HGB,HCT)
Glucose, Bld: 111 mg/dL — ABNORMAL HIGH (ref 70–99)
HCT: 25 % — ABNORMAL LOW (ref 36.0–46.0)
Hemoglobin: 8.5 g/dL — ABNORMAL LOW (ref 12.0–15.0)
Potassium: 4.3 mmol/L (ref 3.5–5.1)
Sodium: 134 mmol/L — ABNORMAL LOW (ref 135–145)

## 2018-09-05 MED ORDER — CLOPIDOGREL BISULFATE 75 MG PO TABS: 75.0000 mg | ORAL_TABLET | Freq: Every day | ORAL | 2 refills | Status: DC

## 2018-09-05 MED ORDER — ATORVASTATIN CALCIUM 10 MG PO TABS: 10.0000 mg | ORAL_TABLET | Freq: Every day | ORAL | 2 refills | Status: AC

## 2018-09-05 MED ORDER — ASPIRIN 81 MG PO TBEC: 81.0000 mg | DELAYED_RELEASE_TABLET | Freq: Every day | ORAL | Status: AC

## 2018-09-05 NOTE — Progress Notes (Signed)
Patient arrived on the floor from 4N, alert and oriented. Denies pain at this time. Safety concerns addressed accordingly, bed is in the lowest position, VS stable. Oriented to room, educated on importance of calling for assistance before getting out of bed. Will continue to monitor.

## 2018-09-05 NOTE — Care Management Important Message (Signed)
Important Message  Patient Details  Name: [name] MRN: [id number] Date of Birth: 10/25/1920   Medicare Important Message Given:  Yes    Anneke Cundy Stefan Church 09/05/2018, 3:49 PM

## 2018-09-05 NOTE — Care Management Note (Signed)
Case Management Note  Patient Details  Name: [name] MRN: [id number] Date of Birth: 12/30/1920  Subjective/Objective:    Pt admitted with a stroke. She is from home alone but has caregivers 12 hours a day.  DME: cane, walker, shower chair, wheelchair Pts aides assist with medications. Pt's aides provide transportation.                Action/Plan: Pt discharging home with orders for [facility] services. CM provided choice and [facility] selected. Cory with [facility] notified and accepted the referral. Daughter is arranging 24 hour caregivers so she will have the supervision recommended.  Daughter to provide transport home.  Expected Discharge Date:  09/05/18               Expected Discharge Plan:  Home w Home Health Services  In-House Referral:     Discharge planning Services  CM Consult  Post Acute Care Choice:  Home Health Choice offered to:  Adult Children, Patient  DME Arranged:    DME Agency:     HH Arranged:  PT, OT, Speech Therapy HH Agency:  [facility]  Status of Service:  Completed, signed off  If discussed at Long Length of Stay Meetings, dates discussed:    Additional Comments:  Kermit Balo, RN 09/05/2018, 3:02 PM

## 2018-09-05 NOTE — Plan of Care (Signed)
  P Problem: Education: Goal: Knowledge of disease or condition will improve Outcome: Completed/Met Goal: Knowledge of secondary prevention will improve Outcome: Completed/Met Goal: Knowledge of patient specific risk factors addressed and post discharge goals established will improve Outcome: Completed/Met Goal: Individualized Educational Video(s) Outcome: Completed/Met   Problem: Coping: Goal: Will verbalize positive feelings about self Outcome: Completed/Met Goal: Will identify appropriate support needs Outcome: Completed/Met   Problem: Health Behavior/Discharge Planning: Goal: Ability to manage health-related needs will improve Outcome: Completed/Met   Problem: Self-Care: Goal: Ability to participate in self-care as condition permits will improve Outcome: Completed/Met Goal: Verbalization of feelings and concerns over difficulty with self-care will improve Outcome: Completed/Met Goal: Ability to communicate needs accurately will improve Outcome: Completed/Met   Problem: Nutrition: Goal: Risk of aspiration will decrease Outcome: Completed/Met Goal: Dietary intake will improve Outcome: Completed/Met   Problem: Ischemic Stroke/TIA Tissue Perfusion: Goal: Complications of ischemic stroke/TIA will be minimized Outcome: Completed/Met   Problem: Health Behavior/Discharge Planning: Goal: Ability to manage health-related needs will improve Outcome: Completed/Met   Problem: Clinical Measurements: Goal: Ability to maintain clinical measurements within normal limits will improve Outcome: Completed/Met Goal: Will remain free from infection Outcome: Completed/Met Goal: Diagnostic test results will improve Outcome: Completed/Met Goal: Respiratory complications will improve Outcome: Completed/Met Goal: Cardiovascular complication will be avoided Outcome: Completed/Met   Problem: Activity: Goal: Risk for activity intolerance will decrease Outcome: Completed/Met   Problem:  Nutrition: Goal: Adequate nutrition will be maintained Outcome: Completed/Met   Problem: Coping: Goal: Level of anxiety will decrease Outcome: Completed/Met   Problem: Elimination: Goal: Will not experience complications related to bowel motility Outcome: Completed/Met Goal: Will not experience complications related to urinary retention Outcome: Completed/Met   Problem: Pain Managment: Goal: General experience of comfort will improve Outcome: Completed/Met   Problem: Safety: Goal: Ability to remain free from injury will improve Outcome: Completed/Met   Problem: Skin Integrity: Goal: Risk for impaired skin integrity will decrease Outcome: Completed/Met

## 2018-09-05 NOTE — Progress Notes (Signed)
Nurse reviewed content of AVS with patient and gave patient a copy. Patient instructed on where to pick up medications outpatient. Patient was taken down to patient's daughter's car by wheelchair for discharge. All belongings sent with patient.

## 2018-09-05 NOTE — Progress Notes (Signed)
Occupational Therapy Treatment Patient Details Name: NADALIE LEONETTI MRN: [id number] DOB: 07/12/1921 Today's Date: 09/05/2018    History of present illness Pt is a 85 year old female presents with aphasia and right hemiplegia secondary to left MCA stroke caused by M2 occlusion- per neurology.  Patient received IV TPA and was taken to IR for emergent mechanical thrombectomy on 09/02/18.  PMH: Cardiomyopathy, hernia repair.    OT comments  Pt. Seen for skilled OT. Focus of session was ADLs (self feeding/ grooming tasks).  Pt. Eager for participation and will benefit from cont. Skilled therapy for introduction of any positional modifications to aide in increased function with self feeding.    Follow Up Recommendations  Home health OT;Supervision/Assistance - 24 hour    Equipment Recommendations  3 in 1 bedside commode    Recommendations for Other Services      Precautions / Restrictions Precautions Precautions: Fall Precaution Comments: HOH Restrictions Weight Bearing Restrictions: No       Mobility Bed Mobility               General bed mobility comments: up in chair  Transfers                 General transfer comment: integration of bue use for positioning in chair. pt. was found almost in sidelying in the recliner. able to use b ues to push on arm rests and reposition. max a to scoot hips back in chair     Balance                                           ADL either performed or assessed with clinical judgement   ADL Overall ADL's : Needs assistance/impaired Eating/Feeding: Set up;Sitting Eating/Feeding Details (indicate cue type and reason): requires assist to open containers. noted positional assistance required as she demonstrated difficulty bringing spoon to mouth. may benefit from self feeding goals to address position modifications                                    General ADL Comments: asking multiple questions  "whats this? whats that" in regards to food items on her tray.       Vision       Perception     Praxis      Cognition Arousal/Alertness: Awake/alert Behavior During Therapy: WFL for tasks assessed/performed Overall Cognitive Status: Within Functional Limits for tasks assessed                                          Exercises     Shoulder Instructions       General Comments      Pertinent Vitals/ Pain       Pain Assessment: No/denies pain  Home Living                                          Prior Functioning/Environment              Frequency  Min 2X/week        Progress Toward Goals  OT Goals(current goals can now  be found in the care plan section)  Progress towards OT goals: Progressing toward goals     Plan Discharge plan remains appropriate;Frequency remains appropriate    Co-evaluation                 AM-PAC OT "6 Clicks" Daily Activity     Outcome Measure   Help from another person eating meals?: None Help from another person taking care of personal grooming?: A Little Help from another person toileting, which includes using toliet, bedpan, or urinal?: A Lot Help from another person bathing (including washing, rinsing, drying)?: A Lot Help from another person to put on and taking off regular upper body clothing?: A Little Help from another person to put on and taking off regular lower body clothing?: A Lot 6 Click Score: 16    End of Session    OT Visit Diagnosis: Other abnormalities of gait and mobility (R26.89);Muscle weakness (generalized) (M62.81)   Activity Tolerance Patient tolerated treatment well   Patient Left in chair;with call bell/phone within reach   Nurse Communication          Time: 1130-1143 OT Time Calculation (min): 13 min  Charges: OT General Charges $OT Visit: 1 Visit OT Treatments $Self Care/Home Management : 8-22 mins    Robet Leu,  COTA/L 09/05/2018, 1:09 PM

## 2018-09-06 LAB — TYPE AND SCREEN
ABO/RH(D): O POS
Antibody Screen: NEGATIVE
Unit division: 0

## 2018-09-06 LAB — BPAM RBC
Blood Product Expiration Date: 202003102359
Unit Type and Rh: 5100

## 2018-10-08 ENCOUNTER — Inpatient Hospital Stay: Payer: Self-pay | Admitting: Adult Health

## 2018-10-28 ENCOUNTER — Inpatient Hospital Stay: Payer: Self-pay | Admitting: Adult Health

## 2018-11-06 ENCOUNTER — Telehealth: Payer: Self-pay

## 2018-11-06 NOTE — Telephone Encounter (Signed)
Spoke with the patient's caregiver. Pt has given consent to do a webex and to file her insurance. E-mail has been confirmed.

## 2018-11-13 ENCOUNTER — Inpatient Hospital Stay: Payer: Medicare Other | Admitting: Adult Health

## 2018-11-13 ENCOUNTER — Other Ambulatory Visit: Payer: Self-pay

## 2018-11-13 NOTE — Telephone Encounter (Signed)
I spoke with [name] about the video visit today. [name] stated she did not download the webex on her phone. She stated her husband was helping her but gave her wrong instructions. I tried to troubleshoot but she was unable. Shanda Bumps NP is aware appt will be r/s. I help [email or web address] on her cell phone. I did a test video with the pt and her mother. I can see the pt and they could see me on the video.. The daughter microphone was not working.We talk via home phone. [name] will get somebody to help enable her microphone for the visit. I stated Shanda Bumps NP will notified to call the home phone to listen to daughter during the video visit if needed. Verbal consent from daughter to do video and file insurance.

## 2018-11-13 NOTE — Progress Notes (Deleted)
[facility] [address] [place]. [place] [address] [phone]       VIRTUAL VISIT FOLLOW UP NOTE  Ms. Leslie SPEAKMAN Date of Birth:  12/18/1920 Medical Record Number:  [id number]   Reason for Referral:  hospital stroke follow up    Virtual Visit via Video Note  I connected with Roanna Epley on 11/13/18 at  3:15 PM EDT by a video enabled telemedicine application located remotely in my own home and verified that I am speaking with the correct person using two identifiers who was located at their own home.   I discussed the limitations of evaluation and management by telemedicine and the availability of in person appointments. The patient expressed understanding and agreed to proceed.   CHIEF COMPLAINT:  No chief complaint on file.   HPI: NICOSHA STRUVE was initially scheduled today for in office hospital follow-up regarding left MCA infarct s/p tPA and IR secondary to large vessel disease versus cardioembolic source on 7/74/1423 but due to COVID-19 safety precautions, visit transition to telemedicine via WebEx. History obtained from *** and chart review. Reviewed all radiology images and labs personally.   Ms.Annamary F Ledwellis a 85 y.o.femalewith history of cardiomyopathy who presented with speech arrest with R facial, L gaze, and R HP.  CT head negative for acute infarct but did show old left PCA infarct and small vessel disease.  CTA head and neck showed emergent LVO left MCA M2 with intracranial atherosclerosis with multiple stenosis (left ICA, right M1/M2, B PCA P2), aortic arthrosclerosis, right submandibular sialolith.  She received IV TPA and transferred to IR where she underwent TICI 3 revascularization of occluded left MCA with mechanical thrombectomy.  MRI brain reviewed and showed several small right PICA territory infarcts but no infarct within left MCA following revascularization.  2D echo EF of 60 to 65% without cardiac source of  embolus.  LDL 95 and A1c 5.1.  Infarct etiology large vessel disease versus cardioembolic source.  Initiated DAPT for 3 months and aspirin alone.  HTN stable.  Initiate low-dose statin for HLD management.  Other stroke risk factors include advanced age, cardiomyopathy and prior stroke per imaging.  She was discharged home with recommendations of home health therapies.     ROS:   14 system review of systems performed and negative with exception of ***  PMH:  Past Medical History:  Diagnosis Date  . Acid reflux   . Bladder disease   . Cardiomyopathy   . Constipation   . Diverticulosis   . Interstitial cystitis   . Renal disorder     PSH:  Past Surgical History:  Procedure Laterality Date  . HERNIA REPAIR    . IR CT HEAD LTD  09/02/2018  . IR PERCUTANEOUS ART THROMBECTOMY/INFUSION INTRACRANIAL INC DIAG ANGIO  09/02/2018  . RADIOLOGY WITH ANESTHESIA N/A 09/02/2018   Procedure: IR WITH ANESTHESIA;  Surgeon: Luanne Bras, MD;  Location: [facility];  Service: Radiology;  Laterality: N/A;    Social History:  Social History   Socioeconomic History  . Marital status: Widowed    Spouse name: Not on file  . Number of children: Not on file  . Years of education: Not on file  . Highest education level: Not on file  Occupational History  . Not on file  Social Needs  . Financial resource strain: Not on file  . Food insecurity:    Worry: Not on file    Inability: Not on file  . Transportation needs:  Medical: Not on file    Non-medical: Not on file  Tobacco Use  . Smoking status: Never Smoker  . Smokeless tobacco: Never Used  Substance and Sexual Activity  . Alcohol use: No  . Drug use: No  . Sexual activity: Not on file  Lifestyle  . Physical activity:    Days per week: Not on file    Minutes per session: Not on file  . Stress: Not on file  Relationships  . Social connections:    Talks on phone: Not on file    Gets together: Not on file    Attends religious service:  Not on file    Active member of club or organization: Not on file    Attends meetings of clubs or organizations: Not on file    Relationship status: Not on file  . Intimate partner violence:    Fear of current or ex partner: Not on file    Emotionally abused: Not on file    Physically abused: Not on file    Forced sexual activity: Not on file  Other Topics Concern  . Not on file  Social History Narrative  . Not on file    Family History:  Family History  Family history unknown: Yes    Medications:   Current Outpatient Medications on File Prior to Visit  Medication Sig Dispense Refill  . aspirin EC 81 MG EC tablet Take 1 tablet (81 mg total) by mouth daily.    Marland Kitchen atorvastatin (LIPITOR) 10 MG tablet Take 1 tablet (10 mg total) by mouth daily at 6 PM. 30 tablet 2  . clopidogrel (PLAVIX) 75 MG tablet Take 1 tablet (75 mg total) by mouth daily. 30 tablet 2  . dorzolamide-timolol (COSOPT) 22.3-6.8 MG/ML ophthalmic solution Place 1 drop into both eyes 2 (two) times daily.    Marland Kitchen HYDROcodone-acetaminophen (NORCO/VICODIN) 5-325 MG tablet Take 1 tablet by mouth every 4 (four) hours as needed for moderate pain. for pain  0  . LINZESS 145 MCG CAPS capsule Take 145 mcg by mouth every morning. As needed  for constipation    . LORazepam (ATIVAN) 0.5 MG tablet Take 0.5 mg by mouth 2 (two) times daily as needed for anxiety or sleep.   4  . omeprazole (PRILOSEC OTC) 20 MG tablet Take 20 mg by mouth daily as needed (acid reflux).    . shark liver oil-cocoa butter (PREPARATION H) 0.25-3-85.5 % suppository Place 1 suppository rectally as needed for hemorrhoids.    . Sodium Phosphates (FLEET ENEMA RE) Place 1 application rectally 2 (two) times daily as needed (constipation).    . timolol (TIMOPTIC) 0.25 % ophthalmic solution Place 1 drop into both eyes 2 (two) times daily.     No current facility-administered medications on file prior to visit.     Allergies:  No Known Allergies   Physical Exam   There were no vitals filed for this visit. There is no height or weight on file to calculate BMI. No exam data present  No flowsheet data found.   General: well developed, well nourished, seated, in no evident distress Head: head normocephalic and atraumatic.     Neurologic Exam Mental Status: Awake and fully alert. Oriented to place and time. Recent and remote memory intact. Attention span, concentration and fund of knowledge appropriate. Mood and affect appropriate.  Cranial Nerves: Fundoscopic exam reveals sharp disc margins. Pupils equal, briskly reactive to light. Extraocular movements full without nystagmus. Visual fields full to confrontation. Hearing intact. Facial  sensation intact. Face, tongue, palate moves normally and symmetrically.  Motor: Normal bulk and tone. Normal strength in all tested extremity muscles. Sensory.: intact to touch , pinprick , position and vibratory sensation.  Coordination: Rapid alternating movements normal in all extremities. Finger-to-nose and heel-to-shin performed accurately bilaterally. Gait and Station: Arises from chair without difficulty. Stance is normal. Gait demonstrates normal stride length and balance. Able to heel, toe and tandem walk without difficulty.  Reflexes: 1+ and symmetric. Toes downgoing.    NIHSS  *** Modified Rankin  ***   Diagnostic Data (Labs, Imaging, Testing)  Ct Head Code Stroke Wo Contrast 09/02/2018 1. No acute finding by CT. Extensive chronic small-vessel ischemic changes throughout the brain. Old left PCA territory infarction. 2. ASPECTS is 10.   Ct Angio Head W Or Wo Contrast Ct Angio Neck W Or Wo Contrast 09/02/2018 1. Positive for ELVO: Occluded Left MCA M2 versus large M3. This is just beyond the left MCA bifurcation. This finding was communicated to Dr. Lorraine Lax at 1616 hours by text page via the [facility] messaging system, and also discussed between Drs. Shogry and Aroor. 2. Intracranial atherosclerosis with  multiple other stenosis: - left ICA anterior genu/supraclinoid segment (moderate). - right MCA distal M1 or proximal M2 (moderate to severe). - bilateral PCA P2 segments (mild-to-moderate) and distal branches (moderate to severe). 3. No significant carotid stenosis in the neck. Moderate proximal right vertebral artery stenosis in the neck due to plaque. 4.  Aortic Atherosclerosis (ICD10-I70.0). 5. Right submandibular gland 11 mm sialolith.   [facility] Percutaneous Art Thrombectomy/infusion Intracranial Inc Diag Angio 09/02/2018 Status post endovascular complete revascularization of occluded dominant inferior division of the left middle cerebral artery with 1 pass with the 5 mm x 33 mm Embotrap retrieval device achieving a TICI 3 revascularization.   Mr Brain Wo Contrast 09/03/2018 1. Negative for left MCA territory core infarct following revascularization. 2. There are several small right PICA territory infarcts. No associated hemorrhage or mass effect. 3. Chronic left PCA territory encephalomalacia with hemosiderin.   2D Echocardiogram  1. The left ventricle has normal systolic function with an ejection fraction of 60-65%. The cavity size was normal. There is mildly increased left ventricular wall thickness. Left ventricular diastolic Doppler parameters are consistent with impaired  relaxation Elevated mean left atrial pressure. 2. The right ventricle has normal systolic function. The cavity was normal. There is no increase in right ventricular wall thickness. Right ventricular systolic pressure is mildly elevated with an estimated pressure of 35.0 mmHg. 3. Right atrial pressure is estimated at 3 mmHg. 4. The mitral valve is normal in structure. There is mild thickening. There is moderate mitral annular calcification present. 5. The tricuspid valve is normal in structure. 6. The aortic valve is tricuspid There is mild calcification of the aortic valve. Aortic valve regurgitation is  trivial by color flow Doppler. 7. The pulmonic valve was normal in structure. 8. Normal LV systolic function; mild LVH with proximal septal thickening; mild diastolic dysfunction; sclerotic aortic valve with trace AI; moderate MAC; trace TR with mild pulmonary hypertension.     ASSESSMENT: LAWRENCIA MAUNEY is a 85 y.o. year old female here with right MCA stroke s/p TPA and IR on 09/02/2018 secondary to large vessel disease versus cardioembolic source. Vascular risk factors include intracranial stenosis, HTN, HLD, prior stroke per imaging, advanced age and cardiomyopathy.     PLAN:  1. Right MCA stroke: Continue aspirin 81 mg daily and clopidogrel 75 mg daily  and ***  for secondary stroke prevention.  Discontinue Plavix and continue on aspirin alone after 12/04/2018 as 55-monthDAPT completed.  Maintain strict control of hypertension with blood pressure goal below 130/90, diabetes with hemoglobin A1c goal below 6.5% and cholesterol with LDL cholesterol (bad cholesterol) goal below 70 mg/dL.  I also advised the patient to eat a healthy diet with plenty of whole grains, cereals, fruits and vegetables, exercise regularly with at least 30 minutes of continuous activity daily and maintain ideal body weight. 2. HTN: Advised to continue current treatment regimen.  Today's BP ***.  Advised to continue to monitor at home along with continued follow-up with PCP for management 3. HLD: Advised to continue current treatment regimen along with continued follow-up with PCP for future prescribing and monitoring of lipid panel     Follow up in *** or call earlier if needed   Greater than 50% of time during this 25 minute visit was spent on counseling, explanation of diagnosis of ***, reviewing risk factor management of ***, planning of further management along with potential future management, and discussion with patient and family answering all questions.    JVenancio Poisson AGNP-BC  [facility] [address] [address] Phone [phone] [phone]: This document was prepared with digital dictation and possible smart phrase technology. Any transcriptional errors that result from this process are unintentional.

## 2018-11-19 ENCOUNTER — Other Ambulatory Visit: Payer: Self-pay

## 2018-11-19 ENCOUNTER — Encounter: Payer: Self-pay | Admitting: Adult Health

## 2018-11-19 ENCOUNTER — Ambulatory Visit (INDEPENDENT_AMBULATORY_CARE_PROVIDER_SITE_OTHER): Payer: Medicare Other | Admitting: Adult Health

## 2018-11-19 DIAGNOSIS — I63412 Cerebral infarction due to embolism of left middle cerebral artery: Secondary | ICD-10-CM

## 2018-11-19 DIAGNOSIS — I10 Essential (primary) hypertension: Secondary | ICD-10-CM

## 2018-11-19 DIAGNOSIS — I639 Cerebral infarction, unspecified: Secondary | ICD-10-CM

## 2018-11-19 DIAGNOSIS — E785 Hyperlipidemia, unspecified: Secondary | ICD-10-CM | POA: Diagnosis not present

## 2018-11-19 DIAGNOSIS — R531 Weakness: Secondary | ICD-10-CM

## 2018-11-19 NOTE — Progress Notes (Signed)
[facility] [address] [place]. [place] [address] [phone]       VIRTUAL VISIT FOLLOW UP NOTE  Ms. [name] Date of Birth:  06/15/1921 Medical Record Number:  [id number]   Reason for Referral:  hospital stroke follow up    Virtual Visit via Video Note  I connected with [name] on 11/19/18 at  2:45 PM EDT by a video enabled telemedicine application located remotely in my own home and verified that I am speaking with the correct person using two identifiers who was located at their own home and accompanied by her daughter.   I discussed the limitations of evaluation and management by telemedicine and the availability of in person appointments. The patient expressed understanding and agreed to proceed.   CHIEF COMPLAINT:  Chief Complaint  Patient presents with   Follow-up    Hospital stroke follow-up    HPI: [name] was initially scheduled today for in office hospital follow-up regarding left MCA infarct s/p tPA and IR for artery to artery emboli secondary to large vessel disease versus cardioembolic source on 2/77/4128 but due to COVID-19 safety precautions, visit transition to telemedicine via WebEx. History obtained from patient, daughter and chart review. Reviewed all radiology images and labs personally.   Leslie F Ledwellis a 85 y.o.femalewith history of cardiomyopathy who presented with speech arrest with R facial, L gaze, and R HP.  CT head negative for acute infarct but did show old left PCA infarct and small vessel disease.  CTA head and neck showed emergent LVO left MCA M2 with intracranial atherosclerosis with multiple stenosis (left ICA, right M1/M2, B PCA P2), aortic arthrosclerosis, right submandibular sialolith.  She received IV TPA and transferred to IR where she underwent TICI 3 revascularization of occluded left MCA with mechanical thrombectomy.  MRI brain reviewed and showed several small right PICA  territory infarcts but no infarct within left MCA following revascularization.  2D echo EF of 60 to 65% without cardiac source of embolus.  LDL 95 and A1c 5.1.  Infarct etiology large vessel disease versus cardioembolic source.  Initiated DAPT for 3 months and aspirin alone.  HTN stable.  Initiate low-dose statin for HLD management.  Other stroke risk factors include advanced age, cardiomyopathy and prior stroke per imaging.  She was discharged home with recommendations of home health therapies.   She has been stable since she has been home with residual right sided weakness but endorses overall improvement.  She is able to ambulate with RW and denies any recent falls. She does have 24/7 assistance for functional safety reasons.  No memory or cognitive concerns.  She was independent prior to her stroke and would have family staying with her throughout the day.  She was initially receiving home therapies but this has since been completed as she greatly improved.  Family continues to do exercises with her at home.  Recommended DAPT for 3 months then aspirin alone but per daughter, she has only been on Plavix since hospital discharge.  She denies bleeding or bruising.  She continues on atorvastatin 10 mg without side effects myalgias.  Blood pressure was being monitored during therapy sessions and was stable with typical readings 130s/70-80s.  No further concerns.  Denies new or worsening stroke/TIA symptoms.    ROS:   14 system review of systems performed and negative with exception of weakness and fatigue  PMH:  Past Medical History:  Diagnosis Date   Acid reflux    Bladder  disease    Cardiomyopathy    Constipation    Diverticulosis    Interstitial cystitis    Renal disorder     PSH:  Past Surgical History:  Procedure Laterality Date   HERNIA REPAIR     IR CT HEAD LTD  09/02/2018   IR PERCUTANEOUS ART THROMBECTOMY/INFUSION INTRACRANIAL INC DIAG ANGIO  09/02/2018   RADIOLOGY WITH  ANESTHESIA N/A 09/02/2018   Procedure: IR WITH ANESTHESIA;  Surgeon: Luanne Bras, MD;  Location: [facility];  Service: Radiology;  Laterality: N/A;    Social History:  Social History   Socioeconomic History   Marital status: Widowed    Spouse name: Not on file   Number of children: Not on file   Years of education: Not on file   Highest education level: Not on file  Occupational History   Not on file  Social Needs   Financial resource strain: Not on file   Food insecurity:    Worry: Not on file    Inability: Not on file   Transportation needs:    Medical: Not on file    Non-medical: Not on file  Tobacco Use   Smoking status: Never Smoker   Smokeless tobacco: Never Used  Substance and Sexual Activity   Alcohol use: No   Drug use: No   Sexual activity: Not on file  Lifestyle   Physical activity:    Days per week: Not on file    Minutes per session: Not on file   Stress: Not on file  Relationships   Social connections:    Talks on phone: Not on file    Gets together: Not on file    Attends religious service: Not on file    Active member of club or organization: Not on file    Attends meetings of clubs or organizations: Not on file    Relationship status: Not on file   Intimate partner violence:    Fear of current or ex partner: Not on file    Emotionally abused: Not on file    Physically abused: Not on file    Forced sexual activity: Not on file  Other Topics Concern   Not on file  Social History Narrative   Not on file    Family History:  Family History  Family history unknown: Yes    Medications:   Current Outpatient Medications on File Prior to Visit  Medication Sig Dispense Refill   aspirin EC 81 MG EC tablet Take 1 tablet (81 mg total) by mouth daily.     atorvastatin (LIPITOR) 10 MG tablet Take 1 tablet (10 mg total) by mouth daily at 6 PM. 30 tablet 2   clopidogrel (PLAVIX) 75 MG tablet Take 1 tablet (75 mg total) by mouth  daily. 30 tablet 2   dorzolamide-timolol (COSOPT) 22.3-6.8 MG/ML ophthalmic solution Place 1 drop into both eyes 2 (two) times daily.     HYDROcodone-acetaminophen (NORCO/VICODIN) 5-325 MG tablet Take 1 tablet by mouth every 4 (four) hours as needed for moderate pain. for pain  0   LINZESS 145 MCG CAPS capsule Take 145 mcg by mouth every morning. As needed  for constipation     LORazepam (ATIVAN) 0.5 MG tablet Take 0.5 mg by mouth 2 (two) times daily as needed for anxiety or sleep.   4   omeprazole (PRILOSEC OTC) 20 MG tablet Take 20 mg by mouth daily as needed (acid reflux).     shark liver oil-cocoa butter (PREPARATION H) 0.25-3-85.5 %  suppository Place 1 suppository rectally as needed for hemorrhoids.     Sodium Phosphates (FLEET ENEMA RE) Place 1 application rectally 2 (two) times daily as needed (constipation).     timolol (TIMOPTIC) 0.25 % ophthalmic solution Place 1 drop into both eyes 2 (two) times daily.     No current facility-administered medications on file prior to visit.     Allergies:  No Known Allergies   Physical Exam  Depression screen PHQ 2/9 11/19/2018  Decreased Interest 0  Down, Depressed, Hopeless 0  PHQ - 2 Score 0     General: Frail pleasant elderly African-American female, seated, in no evident distress Head: head normocephalic and atraumatic.     Neurologic Exam Mental Status: Awake and alert. Oriented to place and time. Recent and remote memory intact. Attention span, concentration and fund of knowledge appropriate. Mood and affect appropriate.  Cranial Nerves: Extraocular movements full without nystagmus. Hearing intact to voice.Face, tongue, palate moves normally and symmetrically.  Shoulder shrug symmetric. Motor: No evidence of pronator drift in bilateral upper extremities large muscles.  Decreased right hand finger dexterity.  Slight drift of right lower extremity over 5 seconds but did not touch floor.  No drift in left lower  extremity. Sensory.: intact to touch , pinprick , position and vibratory sensation.  Coordination: Rapid alternating movements normal in all extremities except for decreased right hand finger tapping.  Orbits left arm over right arm.. Finger-to-nose performed accurately bilaterally. Gait and Station: Arises from chair with mild difficulty. Stance is hunched.  Gait demonstrates quick shuffled steps with use of rolling walker.  Reflexes: UTA   NIHSS  1  Modified Rankin  2   Diagnostic Data (Labs, Imaging, Testing)  Ct Head Code Stroke Wo Contrast 09/02/2018 1. No acute finding by CT. Extensive chronic small-vessel ischemic changes throughout the brain. Old left PCA territory infarction. 2. ASPECTS is 10.   Ct Angio Head W Or Wo Contrast Ct Angio Neck W Or Wo Contrast 09/02/2018 1. Positive for ELVO: Occluded Left MCA M2 versus large M3. This is just beyond the left MCA bifurcation. This finding was communicated to Dr. Lorraine Lax at 1616 hours by text page via the [facility] messaging system, and also discussed between Drs. Shogry and Aroor. 2. Intracranial atherosclerosis with multiple other stenosis: - left ICA anterior genu/supraclinoid segment (moderate). - right MCA distal M1 or proximal M2 (moderate to severe). - bilateral PCA P2 segments (mild-to-moderate) and distal branches (moderate to severe). 3. No significant carotid stenosis in the neck. Moderate proximal right vertebral artery stenosis in the neck due to plaque. 4.  Aortic Atherosclerosis (ICD10-I70.0). 5. Right submandibular gland 11 mm sialolith.   [place] Ir Percutaneous Art Thrombectomy/infusion Intracranial Inc Diag Angio 09/02/2018 Status post endovascular complete revascularization of occluded dominant inferior division of the left middle cerebral artery with 1 pass with the 5 mm x 33 mm Embotrap retrieval device achieving a TICI 3 revascularization.   Mr Brain Wo Contrast 09/03/2018 1. Negative for left MCA territory core  infarct following revascularization. 2. There are several small right PICA territory infarcts. No associated hemorrhage or mass effect. 3. Chronic left PCA territory encephalomalacia with hemosiderin.   2D Echocardiogram  1. The left ventricle has normal systolic function with an ejection fraction of 60-65%. The cavity size was normal. There is mildly increased left ventricular wall thickness. Left ventricular diastolic Doppler parameters are consistent with impaired  relaxation Elevated mean left atrial pressure. 2. The right ventricle has normal systolic  function. The cavity was normal. There is no increase in right ventricular wall thickness. Right ventricular systolic pressure is mildly elevated with an estimated pressure of 35.0 mmHg. 3. Right atrial pressure is estimated at 3 mmHg. 4. The mitral valve is normal in structure. There is mild thickening. There is moderate mitral annular calcification present. 5. The tricuspid valve is normal in structure. 6. The aortic valve is tricuspid There is mild calcification of the aortic valve. Aortic valve regurgitation is trivial by color flow Doppler. 7. The pulmonic valve was normal in structure. 8. Normal LV systolic function; mild LVH with proximal septal thickening; mild diastolic dysfunction; sclerotic aortic valve with trace AI; moderate MAC; trace TR with mild pulmonary hypertension.     ASSESSMENT: LAKIYA COTTAM is a 85 y.o. year old female here with left MCA stroke s/p TPA and TICI 3 revascularization of occluded inferior division left MCA with mechanical thrombectomy on 09/02/2018 secondary to large vessel disease versus cardioembolic source. Vascular risk factors include intracranial stenosis, HTN, HLD, prior stroke per imaging, advanced age and cardiomyopathy.  She has been stable from a stroke standpoint with residual mild right hemiparesis.    PLAN:  1. Left MCA stroke: Continue clopidogrel 75 mg daily and start aspirin  81 mg for secondary stroke prevention; advised to discontinue Plavix and continue on aspirin alone after 12/04/2018.  Continue on atorvastatin 10 mg daily for secondary stroke prevention.  Maintain strict control of hypertension with blood pressure goal below 130/90, diabetes with hemoglobin A1c goal below 6.5% and cholesterol with LDL cholesterol (bad cholesterol) goal below 70 mg/dL.  I also advised the patient to eat a healthy diet with plenty of whole grains, cereals, fruits and vegetables, exercise regularly with at least 30 minutes of continuous activity daily and maintain ideal body weight. 2. Right hemiparesis: Declined additional outpatient therapy.  Advised to continue to do exercises at home with family members and if interested in additional therapy to notify office 3. HTN: Advised to continue current treatment regimen.  Advised to monitor at home along with continued follow-up with PCP for management 4. HLD: Advised to continue current treatment regimen along with continued follow-up with PCP for future prescribing and monitoring of lipid panel     Follow up in 3 months or call earlier if needed   Greater than 50% of time during this 45 minute visit was spent on counseling, explanation of diagnosis of right MCA stroke, reviewing risk factor management of HTN and HLD, planning of further management along with potential future management, and discussion with patient and family answering all questions.    Venancio Poisson, AGNP-BC  [facility] [address] [place], [place] [address]  Phone [phone] Fax [phone] Note: This document was prepared with digital dictation and possible smart phrase technology. Any transcriptional errors that result from this process are unintentional.

## 2018-11-20 NOTE — Progress Notes (Signed)
I agree with the above plan 

## 2018-12-05 ENCOUNTER — Other Ambulatory Visit: Payer: Self-pay

## 2018-12-05 NOTE — Patient Outreach (Signed)
First attempt to obtain mRs. No answer. Left message for return call.  

## 2018-12-09 ENCOUNTER — Other Ambulatory Visit: Payer: Self-pay

## 2018-12-09 NOTE — Patient Outreach (Signed)
Second attempt to obtain mRs. No answer. Left message for return call with unidentified person. She stated she forgot to give Ms. Goon the message the last time.

## 2018-12-17 ENCOUNTER — Other Ambulatory Visit: Payer: Self-pay

## 2018-12-17 NOTE — Patient Outreach (Signed)
3 outreach attempts were completed to obtain mRs. mRs could not be obtained because patient never returned my calls. mRs=7 

## 2018-12-29 ENCOUNTER — Emergency Department (HOSPITAL_COMMUNITY): Payer: Medicare Other

## 2018-12-29 ENCOUNTER — Inpatient Hospital Stay (HOSPITAL_COMMUNITY)
Admission: EM | Admit: 2018-12-29 | Discharge: 2019-01-01 | DRG: 683 | Disposition: A | Discharge status: Hospice | Payer: Medicare Other | Attending: Family Medicine | Admitting: Family Medicine

## 2018-12-29 ENCOUNTER — Other Ambulatory Visit: Payer: Self-pay

## 2018-12-29 DIAGNOSIS — K219 Gastro-esophageal reflux disease without esophagitis: Secondary | ICD-10-CM | POA: Diagnosis present

## 2018-12-29 DIAGNOSIS — N183 Chronic kidney disease, stage 3 unspecified: Secondary | ICD-10-CM | POA: Diagnosis present

## 2018-12-29 DIAGNOSIS — R627 Adult failure to thrive: Secondary | ICD-10-CM | POA: Diagnosis not present

## 2018-12-29 DIAGNOSIS — Z7189 Other specified counseling: Secondary | ICD-10-CM

## 2018-12-29 DIAGNOSIS — R339 Retention of urine, unspecified: Secondary | ICD-10-CM | POA: Diagnosis present

## 2018-12-29 DIAGNOSIS — R296 Repeated falls: Secondary | ICD-10-CM | POA: Diagnosis present

## 2018-12-29 DIAGNOSIS — E785 Hyperlipidemia, unspecified: Secondary | ICD-10-CM | POA: Diagnosis present

## 2018-12-29 DIAGNOSIS — E872 Acidosis: Secondary | ICD-10-CM | POA: Diagnosis present

## 2018-12-29 DIAGNOSIS — K5909 Other constipation: Secondary | ICD-10-CM | POA: Diagnosis present

## 2018-12-29 DIAGNOSIS — N179 Acute kidney failure, unspecified: Principal | ICD-10-CM | POA: Diagnosis present

## 2018-12-29 DIAGNOSIS — Z681 Body mass index (BMI) 19 or less, adult: Secondary | ICD-10-CM

## 2018-12-29 DIAGNOSIS — Z8673 Personal history of transient ischemic attack (TIA), and cerebral infarction without residual deficits: Secondary | ICD-10-CM

## 2018-12-29 DIAGNOSIS — E860 Dehydration: Secondary | ICD-10-CM | POA: Diagnosis present

## 2018-12-29 DIAGNOSIS — R634 Abnormal weight loss: Secondary | ICD-10-CM | POA: Diagnosis present

## 2018-12-29 DIAGNOSIS — N189 Chronic kidney disease, unspecified: Secondary | ICD-10-CM | POA: Diagnosis present

## 2018-12-29 DIAGNOSIS — Z79891 Long term (current) use of opiate analgesic: Secondary | ICD-10-CM

## 2018-12-29 DIAGNOSIS — D631 Anemia in chronic kidney disease: Secondary | ICD-10-CM | POA: Diagnosis present

## 2018-12-29 DIAGNOSIS — R112 Nausea with vomiting, unspecified: Secondary | ICD-10-CM | POA: Diagnosis not present

## 2018-12-29 DIAGNOSIS — Z515 Encounter for palliative care: Secondary | ICD-10-CM

## 2018-12-29 DIAGNOSIS — Z7982 Long term (current) use of aspirin: Secondary | ICD-10-CM

## 2018-12-29 DIAGNOSIS — Z66 Do not resuscitate: Secondary | ICD-10-CM | POA: Diagnosis present

## 2018-12-29 DIAGNOSIS — H9201 Otalgia, right ear: Secondary | ICD-10-CM | POA: Diagnosis present

## 2018-12-29 DIAGNOSIS — D638 Anemia in other chronic diseases classified elsewhere: Secondary | ICD-10-CM | POA: Diagnosis present

## 2018-12-29 DIAGNOSIS — Z1159 Encounter for screening for other viral diseases: Secondary | ICD-10-CM

## 2018-12-29 DIAGNOSIS — Z79899 Other long term (current) drug therapy: Secondary | ICD-10-CM

## 2018-12-29 DIAGNOSIS — K5730 Diverticulosis of large intestine without perforation or abscess without bleeding: Secondary | ICD-10-CM | POA: Diagnosis present

## 2018-12-29 LAB — URINALYSIS, ROUTINE W REFLEX MICROSCOPIC
Bilirubin Urine: NEGATIVE
Glucose, UA: NEGATIVE mg/dL
Hgb urine dipstick: NEGATIVE
Ketones, ur: 5 mg/dL — AB
Leukocytes,Ua: NEGATIVE
Nitrite: NEGATIVE
Protein, ur: NEGATIVE mg/dL
Specific Gravity, Urine: 1.017 (ref 1.005–1.030)
pH: 5 (ref 5.0–8.0)

## 2018-12-29 LAB — COMPREHENSIVE METABOLIC PANEL
ALT: 13 U/L (ref 0–44)
AST: 24 U/L (ref 15–41)
Albumin: 2.6 g/dL — ABNORMAL LOW (ref 3.5–5.0)
Alkaline Phosphatase: 57 U/L (ref 38–126)
Anion gap: 15 (ref 5–15)
BUN: 24 mg/dL — ABNORMAL HIGH (ref 8–23)
CO2: 18 mmol/L — ABNORMAL LOW (ref 22–32)
Calcium: 8.5 mg/dL — ABNORMAL LOW (ref 8.9–10.3)
Chloride: 102 mmol/L (ref 98–111)
Creatinine, Ser: 1.83 mg/dL — ABNORMAL HIGH (ref 0.44–1.00)
GFR calc Af Amer: 27 mL/min — ABNORMAL LOW (ref >60)
GFR calc non Af Amer: 23 mL/min — ABNORMAL LOW (ref >60)
Glucose, Bld: 83 mg/dL (ref 70–99)
Potassium: 5 mmol/L (ref 3.5–5.1)
Sodium: 135 mmol/L (ref 135–145)
Total Bilirubin: 1.3 mg/dL — ABNORMAL HIGH (ref 0.3–1.2)
Total Protein: 5.2 g/dL — ABNORMAL LOW (ref 6.5–8.1)

## 2018-12-29 LAB — CBC WITH DIFFERENTIAL/PLATELET
Abs Immature Granulocytes: 0.04 10*3/uL (ref 0.00–0.07)
Basophils Absolute: 0 10*3/uL (ref 0.0–0.1)
Basophils Relative: 1 %
Eosinophils Absolute: 0 10*3/uL (ref 0.0–0.5)
Eosinophils Relative: 0 %
HCT: 33.2 % — ABNORMAL LOW (ref 36.0–46.0)
Hemoglobin: 10.6 g/dL — ABNORMAL LOW (ref 12.0–15.0)
Immature Granulocytes: 1 %
Lymphocytes Relative: 15 %
Lymphs Abs: 1.1 10*3/uL (ref 0.7–4.0)
MCH: 32.3 pg (ref 26.0–34.0)
MCHC: 31.9 g/dL (ref 30.0–36.0)
MCV: 101.2 fL — ABNORMAL HIGH (ref 80.0–100.0)
Monocytes Absolute: 0.4 10*3/uL (ref 0.1–1.0)
Monocytes Relative: 6 %
Neutro Abs: 5.9 10*3/uL (ref 1.7–7.7)
Neutrophils Relative %: 77 %
Platelets: 330 10*3/uL (ref 150–400)
RBC: 3.28 MIL/uL — ABNORMAL LOW (ref 3.87–5.11)
RDW: 15.4 % (ref 11.5–15.5)
WBC: 7.6 10*3/uL (ref 4.0–10.5)
nRBC: 0 % (ref 0.0–0.2)

## 2018-12-29 LAB — SARS CORONAVIRUS 2 BY RT PCR (HOSPITAL ORDER, PERFORMED IN ~~LOC~~ HOSPITAL LAB): SARS Coronavirus 2: NEGATIVE

## 2018-12-29 LAB — I-STAT CHEM 8, ED
BUN: 24 mg/dL — ABNORMAL HIGH (ref 8–23)
Calcium, Ion: 0.97 mmol/L — ABNORMAL LOW (ref 1.15–1.40)
Chloride: 109 mmol/L (ref 98–111)
Creatinine, Ser: 1.5 mg/dL — ABNORMAL HIGH (ref 0.44–1.00)
Glucose, Bld: 66 mg/dL — ABNORMAL LOW (ref 70–99)
HCT: 36 % (ref 36.0–46.0)
Hemoglobin: 12.2 g/dL (ref 12.0–15.0)
Potassium: 5.3 mmol/L — ABNORMAL HIGH (ref 3.5–5.1)
Sodium: 130 mmol/L — ABNORMAL LOW (ref 135–145)
TCO2: 13 mmol/L — ABNORMAL LOW (ref 22–32)

## 2018-12-29 LAB — LACTIC ACID, PLASMA: Lactic Acid, Venous: 2.4 mmol/L (ref 0.5–1.9)

## 2018-12-29 LAB — LIPASE, BLOOD: Lipase: 19 U/L (ref 11–51)

## 2018-12-29 MED ORDER — SODIUM CHLORIDE 0.9 % IV BOLUS
500.0000 mL | Freq: Once | INTRAVENOUS | Status: AC
  Administered 2018-12-29: 500 mL via INTRAVENOUS

## 2018-12-29 MED ORDER — CLOPIDOGREL BISULFATE 75 MG PO TABS: 75.0000 mg | ORAL_TABLET | Freq: Every day | ORAL | Status: DC

## 2018-12-29 MED ORDER — LINACLOTIDE 145 MCG PO CAPS
145.0000 ug | ORAL_CAPSULE | Freq: Every day | ORAL | Status: DC | PRN
  Filled 2018-12-29: qty 1

## 2018-12-29 MED ORDER — ASPIRIN EC 81 MG PO TBEC
81.0000 mg | DELAYED_RELEASE_TABLET | Freq: Every day | ORAL | Status: DC
  Administered 2018-12-30 – 2019-01-01 (×3): 81 mg via ORAL
  Filled 2018-12-29 (×3): qty 1

## 2018-12-29 MED ORDER — SODIUM CHLORIDE 0.9% FLUSH
10.0000 mL | Freq: Two times a day (BID) | INTRAVENOUS | Status: DC
  Administered 2018-12-29 – 2019-01-01 (×4): 10 mL

## 2018-12-29 MED ORDER — ACETAMINOPHEN 325 MG PO TABS: 650.0000 mg | ORAL_TABLET | Freq: Four times a day (QID) | ORAL | Status: DC | PRN

## 2018-12-29 MED ORDER — ATORVASTATIN CALCIUM 10 MG PO TABS
10.0000 mg | ORAL_TABLET | Freq: Every day | ORAL | Status: DC
  Administered 2018-12-30: 10 mg via ORAL
  Filled 2018-12-29: qty 1

## 2018-12-29 MED ORDER — HYDROCODONE-ACETAMINOPHEN 5-325 MG PO TABS
1.0000 | ORAL_TABLET | ORAL | Status: DC | PRN
  Administered 2018-12-30: 1 via ORAL
  Filled 2018-12-29: qty 1

## 2018-12-29 MED ORDER — ACETAMINOPHEN 650 MG RE SUPP: 650.0000 mg | Freq: Four times a day (QID) | RECTAL | Status: DC | PRN

## 2018-12-29 MED ORDER — ENOXAPARIN SODIUM 40 MG/0.4ML ~~LOC~~ SOLN
40.0000 mg | Freq: Every day | SUBCUTANEOUS | Status: DC
  Administered 2018-12-29 – 2018-12-30 (×2): 40 mg via SUBCUTANEOUS
  Filled 2018-12-29 (×2): qty 0.4

## 2018-12-29 MED ORDER — ONDANSETRON HCL 4 MG PO TABS: 4.0000 mg | ORAL_TABLET | Freq: Four times a day (QID) | ORAL | Status: DC | PRN

## 2018-12-29 MED ORDER — LORAZEPAM 1 MG PO TABS: 0.5000 mg | ORAL_TABLET | Freq: Two times a day (BID) | ORAL | Status: DC | PRN

## 2018-12-29 MED ORDER — SODIUM CHLORIDE 0.9% FLUSH: 10.0000 mL | INTRAVENOUS | Status: DC | PRN

## 2018-12-29 MED ORDER — ONDANSETRON HCL 4 MG/2ML IJ SOLN: 4.0000 mg | Freq: Four times a day (QID) | INTRAMUSCULAR | Status: DC | PRN

## 2018-12-29 MED ORDER — SODIUM CHLORIDE 0.9 % IV SOLN
INTRAVENOUS | Status: DC
  Administered 2018-12-29 – 2018-12-30 (×3): via INTRAVENOUS

## 2018-12-29 MED ORDER — DEXTROSE 50 % IV SOLN
50.0000 mL | Freq: Once | INTRAVENOUS | Status: AC
  Administered 2018-12-29: 50 mL via INTRAVENOUS
  Filled 2018-12-29: qty 50

## 2018-12-29 MED ORDER — PANTOPRAZOLE SODIUM 40 MG PO TBEC: 40.0000 mg | DELAYED_RELEASE_TABLET | Freq: Every day | ORAL | Status: DC | PRN

## 2018-12-29 MED ORDER — DORZOLAMIDE HCL-TIMOLOL MAL 2-0.5 % OP SOLN
1.0000 [drp] | Freq: Two times a day (BID) | OPHTHALMIC | Status: DC
  Administered 2018-12-30 – 2019-01-01 (×5): 1 [drp] via OPHTHALMIC
  Filled 2018-12-29 (×2): qty 10

## 2018-12-29 NOTE — H&P (Addendum)
History and Physical    Leslie HalsBernice F Borel EAV:[id number] DOB: 1921-04-15 DOA: 12/29/2018  PCP: Corine ShelterKilpatrick, George, MD  Patient coming from: Home  I have personally briefly reviewed patient[facility]  Chief Complaint: Decreased PO intake  HPI: [name] is a 85 y.o. female with medical history significant of Strokes, CKD stage 3.  Patient has had nausea, abd pain, and 1 episode of vomiting today, decreased PO intake over the past couple of days at home.  No BM for past several days.   ED Course: CT abd/pelvis neg for acute findings: does show chronic pseudocysts of pancreas, severe diverticulosis without evidence of diverticulitis.  Creat 1.8 (baseline).  UA neg.  CT head shows old L PCA stroke, and chronic microvascular dz.  Given 1L bolus.   Review of Systems: As per HPI otherwise 10 point review of systems negative.   Past Medical History:  Diagnosis Date   Acid reflux    Bladder disease    Cardiomyopathy    Constipation    Diverticulosis    Interstitial cystitis    Renal disorder     Past Surgical History:  Procedure Laterality Date   HERNIA REPAIR     IR CT HEAD LTD  09/02/2018   IR PERCUTANEOUS ART THROMBECTOMY/INFUSION INTRACRANIAL INC DIAG ANGIO  09/02/2018   RADIOLOGY WITH ANESTHESIA N/A 09/02/2018   Procedure: IR WITH ANESTHESIA;  Surgeon: Julieanne Cottoneveshwar, Sanjeev, MD;  Location: [facility] OR;  Service: Radiology;  Laterality: N/A;     reports that she has never smoked. She has never used smokeless tobacco. She reports that she does not drink alcohol or use drugs.  No Known Allergies  Family History  Family history unknown: Yes     Prior to Admission medications   Medication Sig Start Date End Date Taking? Authorizing Provider  aspirin EC 81 MG EC tablet Take 1 tablet (81 mg total) by mouth daily. 09/06/18   Layne BentonBiby, Sharon L, NP  atorvastatin (LIPITOR) 10 MG tablet Take 1 tablet (10 mg total) by mouth daily at 6 PM.  09/05/18   Layne BentonBiby, Sharon L, NP  clopidogrel (PLAVIX) 75 MG tablet Take 1 tablet (75 mg total) by mouth daily. 09/06/18   Layne BentonBiby, Sharon L, NP  dorzolamide-timolol (COSOPT) 22.3-6.8 MG/ML ophthalmic solution Place 1 drop into both eyes 2 (two) times daily.    [provider]  HYDROcodone-acetaminophen (NORCO/VICODIN) 5-325 MG tablet Take 1 tablet by mouth every 4 (four) hours as needed for moderate pain. for pain 12/19/17   [provider]  LINZESS 145 MCG CAPS capsule Take 145 mcg by mouth every morning. As needed  for constipation 06/02/18   [provider]  LORazepam (ATIVAN) 0.5 MG tablet Take 0.5 mg by mouth 2 (two) times daily as needed for anxiety or sleep.  12/19/17   [provider]  omeprazole (PRILOSEC OTC) 20 MG tablet Take 20 mg by mouth daily as needed (acid reflux).    [provider]  shark liver oil-cocoa butter (PREPARATION H) 0.25-3-85.5 % suppository Place 1 suppository rectally as needed for hemorrhoids.    [provider]  Sodium Phosphates (FLEET ENEMA RE) Place 1 application rectally 2 (two) times daily as needed (constipation).    [provider]  timolol (TIMOPTIC) 0.25 % ophthalmic solution Place 1 drop into both eyes 2 (two) times daily. 08/14/18   [provider]    Physical Exam: Vitals:   12/29/18 1945 12/29/18 2000 12/29/18 2015 12/29/18 2030  BP:  (!) 124/52  (!) 115/39  Pulse: 74 84 88   Resp: (!) 21 20 19 15   Temp:      TempSrc:      SpO2: 100% 99% 99%     Constitutional: NAD, calm, comfortable Eyes: PERRL, lids and conjunctivae normal ENMT: Mucous membranes are moist. Posterior pharynx clear of any exudate or lesions.Normal dentition.  Neck: normal, supple, no masses, no thyromegaly Respiratory: clear to auscultation bilaterally, no wheezing, no crackles. Normal respiratory effort. No accessory muscle use.  Cardiovascular: Murmur present  Abdomen: diffuse TTP, no rebound Musculoskeletal:  no clubbing / cyanosis. No joint deformity upper and lower extremities. Good ROM, no contractures. Normal muscle tone.  Skin: no rashes, lesions, ulcers. No induration Neurologic: Generalized weakness diffusely, sensation intact Psychiatric: Normal judgment and insight. Alert and oriented x 3. Normal mood.    Labs on Admission: I have personally reviewed following labs and imaging studies  CBC: Recent Labs  Lab 12/29/18 1512 12/29/18 1600  WBC  --  7.6  NEUTROABS  --  5.9  HGB 12.2 10.6*  HCT 36.0 33.2*  MCV  --  101.2*  PLT  --  330   Basic Metabolic Panel: Recent Labs  Lab 12/29/18 1512 12/29/18 1600  NA 130* 135  K 5.3* 5.0  CL 109 102  CO2  --  18*  GLUCOSE 66* 83  BUN 24* 24*  CREATININE 1.50* 1.83*  CALCIUM  --  8.5*   GFR: CrCl cannot be calculated (Unknown ideal weight.). Liver Function Tests: Recent Labs  Lab 12/29/18 1600  AST 24  ALT 13  ALKPHOS 57  BILITOT 1.3*  PROT 5.2*  ALBUMIN 2.6*   Recent Labs  Lab 12/29/18 1600  LIPASE 19   No results for input(s): AMMONIA in the last 168 hours. Coagulation Profile: No results for input(s): INR, PROTIME in the last 168 hours. Cardiac Enzymes: No results for input(s): CKTOTAL, CKMB, CKMBINDEX, TROPONINI in the last 168 hours. BNP (last 3 results) No results for input(s): PROBNP in the last 8760 hours. HbA1C: No results for input(s): HGBA1C in the last 72 hours. CBG: No results for input(s): GLUCAP in the last 168 hours. Lipid Profile: No results for input(s): CHOL, HDL, LDLCALC, TRIG, CHOLHDL, LDLDIRECT in the last 72 hours. Thyroid Function Tests: No results for input(s): TSH, T4TOTAL, FREET4, T3FREE, THYROIDAB in the last 72 hours. Anemia Panel: No results for input(s): VITAMINB12, FOLATE, FERRITIN, TIBC, IRON, RETICCTPCT in the last 72 hours. Urine analysis:    Component Value Date/Time   COLORURINE YELLOW 12/29/2018 2020   APPEARANCEUR CLEAR 12/29/2018 2020   LABSPEC 1.017 12/29/2018 2020    PHURINE 5.0 12/29/2018 2020   GLUCOSEU NEGATIVE 12/29/2018 2020   HGBUR NEGATIVE 12/29/2018 2020   BILIRUBINUR NEGATIVE 12/29/2018 2020   KETONESUR 5 (A) 12/29/2018 2020   PROTEINUR NEGATIVE 12/29/2018 2020   UROBILINOGEN 1.0 01/29/2017 1142   NITRITE NEGATIVE 12/29/2018 2020   LEUKOCYTESUR NEGATIVE 12/29/2018 2020    Radiological Exams on Admission: Ct Abdomen Pelvis Wo Contrast  Result Date: 12/29/2018 CLINICAL DATA:  85 year old female with failure to thrive and weakness. Reduced intake over the past 48 hours. EXAM: CT ABDOMEN AND PELVIS WITHOUT CONTRAST TECHNIQUE: Multidetector CT imaging of the abdomen and pelvis was performed following the standard protocol without IV contrast. COMPARISON:  CT the abdomen and pelvis 11/04/2017. FINDINGS: Lower chest: Trace left pleural effusion.  Aortic atherosclerosis. Hepatobiliary: No definite cystic or solid hepatic lesions are confidently identified on today's noncontrast CT  examination. Gallbladder is unremarkable in appearance. Pancreas: Large low-attenuation lesion which appears centered in the head of the pancreas measuring up to 5.8 x 9.6 x 8.4 cm (axial image 30 of series 3 and coronal image 45 of series 6), similar to the prior examination. Additionally, in the right upper quadrant immediately anterolateral to this lesion there is a smaller 2.3 cm low-attenuation lesion which also appears similar to the prior examination (axial image 24 of series 3). Body and tail of the pancreas are mildly atrophic, but otherwise unremarkable in appearance. No peripancreatic inflammatory changes. Spleen: Unremarkable. Adrenals/Urinary Tract: Unenhanced appearance of the kidneys and bilateral adrenal glands is normal. No hydroureteronephrosis. Urinary bladder is normal in appearance. Stomach/Bowel: Normal appearance of the stomach. No pathologic dilatation of small bowel or colon. Numerous colonic diverticulae are noted, particularly in the descending colon and  sigmoid colon, without surrounding inflammatory changes to suggest an acute diverticulitis at this time. Normal appendix. Vascular/Lymphatic: Aortic atherosclerosis. No lymphadenopathy noted in the abdomen or pelvis. Reproductive: Small calcified lesions associated with the uterus, presumably calcified fibroids. Ovaries are a trophic. Other: No significant volume of ascites.  No pneumoperitoneum. Musculoskeletal: There are no aggressive appearing lytic or blastic lesions noted in the visualized portions of the skeleton. IMPRESSION: 1. No acute findings are noted in the abdomen or pelvis to account for the patient's symptoms. 2. Severe colonic diverticulosis without evidence of acute diverticulitis at this time. 3. Large cystic lesions associated with the head of the pancreas grossly similar to prior examinations over the past several years, presumably benign pancreatic pseudocysts. 4. Trace left pleural effusion lying dependently. 5. Aortic atherosclerosis. 6. Additional incidental findings, as above. Electronically Signed   By: Trudie Reedaniel  Entrikin M.D.   On: 12/29/2018 17:47   Ct Head Wo Contrast  Result Date: 12/29/2018 CLINICAL DATA:  85 year old female with history of failure to thrive. EXAM: CT HEAD WITHOUT CONTRAST TECHNIQUE: Contiguous axial images were obtained from the base of the skull through the vertex without intravenous contrast. COMPARISON:  Head CT 09/02/2018. FINDINGS: Brain: Patchy and confluent areas of decreased attenuation are noted throughout the deep and periventricular white matter of the cerebral hemispheres bilaterally, compatible with chronic microvascular ischemic disease. Well-defined area of low attenuation in the left parietooccipital region, similar to prior study from 09/02/2018, compatible with an area of chronic encephalomalacia/gliosis from old left PCA territory infarction. No evidence of acute infarction, hemorrhage, hydrocephalus, extra-axial collection or mass lesion/mass  effect. Vascular: No hyperdense vessel or unexpected calcification. Skull: Normal. Negative for fracture or focal lesion. Sinuses/Orbits: No acute finding. Other: None. IMPRESSION: 1. No acute intracranial abnormalities. 2. 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 Electronically Signed   By: Trudie Reedaniel  Entrikin M.D.   On: 12/29/2018 17:39   Dg Chest Port 1 View  Result Date: 12/29/2018 CLINICAL DATA:  Weakness and poor appetite. EXAM: PORTABLE CHEST 1 VIEW COMPARISON:  Chest x-ray 10/03/2013 FINDINGS: The cardiac silhouette, mediastinal and hilar contours are within normal limits and stable. There is tortuosity and calcification of the thoracic aorta. The lungs are clear of an acute process. No worrisome pulmonary lesions. Mild a buckle scarring type changes. The bony thorax is intact. IMPRESSION: No acute cardiopulmonary findings. Electronically Signed   By: Rudie MeyerP.  Gallerani M.D.   On: 12/29/2018 15:09    EKG: Independently reviewed.  Assessment/Plan Principal Problem:   Nausea & vomiting Active Problems:   CKD (chronic kidney disease), stage III (HCC)    1.  N/V + Abd pain - 1. No acute CT findings, ? Viral etiology? 2. Treat symptomatically for now 3. Zofran PRN 4. Norco PRN 5. IVF: NS at 50 cc/hr 6. Repeat BMP in AM 7. Checking lactate 8. No mention on CT abd/pelvis of increased stool burden or constipation 9. COVID pending, though I would have expected some lung findings on CT if this were to be the cause, also no fever 2. CKD stage 3 - chronic and stable 3. H/o stroke - cont ASA / Plavix 4. Wide pulse pressure noted - though echo just a couple of months ago didn't show any significant AS.  DVT prophylaxis: Lovenox Code Status: Full Family Communication: No family in room Disposition Plan: Home after admit Consults called: None Admission status: Place in 75     Abdul Beirne, [place] [facility]  How to  contact the [facility] Attending or Consulting provider [place] or covering provider during after hours [place], for this patient?  1. Check the care team in [facility] and look for a) attending/consulting [facility] provider listed and b) the [facility] team listed 2. Log into [email or web address]  Amion Physician Scheduling and messaging for groups and whole hospitals  On call and physician scheduling software for group practices, residents, hospitalists and other medical providers for call, clinic, rotation and shift schedules. On[employer] is a hospital-wide system for scheduling doctors and paging doctors on call. EasyPlot is for scientific plotting and data analysis.  [email or web address]  and use [place]'s universal password to access. If you do not have the password, please contact the hospital operator.  3. Locate the [facility] provider you are looking for under [facility] and page to a number that you can be directly reached. 4. If you still have difficulty reaching the provider, please page the [facility] (Director on Call) for the Hospitalists listed on amion for assistance.  12/29/2018, 9:17 PM

## 2018-12-29 NOTE — ED Notes (Signed)
ED TO INPATIENT HANDOFF REPORT  ED Nurse Name and Phone #:  Evens Meno [phone]  S Name/Age/Gender [name] 85 y.o. female Room/Bed: 025C/025C  Code Status   Code Status: Full Code  Home/SNF/Other Home Patient oriented to: self, place and situation Is this baseline? Yes   Triage Complete: Triage complete  Chief Complaint sick  Triage Note Failure to thrive from home. Weakness and reduced intake x48 hours. Note in progress...   Allergies No Known Allergies  Level of Care/Admitting Diagnosis ED Disposition    ED Disposition Condition Comment   Admit  [facility]ospital [facility] [[id number]]  Level of Care: Med-Surg [16]  I expect the patient will be discharged within 24 hours: Yes  LOW acuity---Tx typically complete <24 hrs---ACUTE conditions typically can be evaluated <24 hours---LABS likely to return to acceptable levels <24 hours---IS near functional baseline---EXPECTED to return to current living arrangement---NOT newly hypoxic: Meets criteria for 5C-Observation unit  Covid Evaluation: Screening Protocol (No Symptoms)  Diagnosis: Nausea & vomiting [277057]  Admitting Physician: Hillary BowGARDNER, JARED M [phone][[id number]]  Attending Physician: Hillary BowGARDNER, JARED M [[id number]]  PT Class (Do Not Modify): Observation [104]  PT Acc Code (Do Not Modify): Observation [10022]       B Medical/Surgery History Past Medical History:  Diagnosis Date  . Acid reflux   . Bladder disease   . Cardiomyopathy   . Constipation   . Diverticulosis   . Interstitial cystitis   . Renal disorder    Past Surgical History:  Procedure Laterality Date  . HERNIA REPAIR    . IR CT HEAD LTD  09/02/2018  . IR PERCUTANEOUS ART THROMBECTOMY/INFUSION INTRACRANIAL INC DIAG ANGIO  09/02/2018  . RADIOLOGY WITH ANESTHESIA N/A 09/02/2018   Procedure: IR WITH ANESTHESIA;  Surgeon: Julieanne Cottoneveshwar, Sanjeev, MD;  Location: [facility] OR;  Service: Radiology;  Laterality: N/A;     A IV Location/Drains/Wounds Patient  Lines/Drains/Airways Status   Active Line/Drains/Airways    Name:   Placement date:   Placement time:   Site:   Days:   Midline Single Lumen 12/29/18 Midline Right Basilic 8 cm 0 cm   12/29/18    1615    Basilic   less than 1   External Urinary Catheter   12/29/18    1828    -   less than 1          Intake/Output Last 24 hours  Intake/Output Summary (Last 24 hours) at 12/29/2018 2129 Last data filed at 12/29/2018 2020 Gross per 24 hour  Intake 1000 ml  Output -  Net 1000 ml    Labs/Imaging Results for orders placed or performed during the hospital encounter of 12/29/18 (from the past 48 hour(s))  I-stat chem 8, ed     Status: Abnormal   Collection Time: 12/29/18  3:12 PM  Result Value Ref Range   Sodium 130 (L) 135 - 145 mmol/L   Potassium 5.3 (H) 3.5 - 5.1 mmol/L   Chloride 109 98 - 111 mmol/L   BUN 24 (H) 8 - 23 mg/dL   Creatinine, Ser 9.601.50 (H) 0.44 - 1.00 mg/dL   Glucose, Bld 66 (L) 70 - 99 mg/dL   Calcium, Ion 4.540.97 (L) 1.15 - 1.40 mmol/L   TCO2 13 (L) 22 - 32 mmol/L   Hemoglobin 12.2 12.0 - 15.0 g/dL   HCT 09.836.0 11.936.0 - 14.746.0 %  Comprehensive metabolic panel     Status: Abnormal   Collection Time: 12/29/18  4:00 PM  Result Value Ref  Range   Sodium 135 135 - 145 mmol/L   Potassium 5.0 3.5 - 5.1 mmol/L   Chloride 102 98 - 111 mmol/L   CO2 18 (L) 22 - 32 mmol/L   Glucose, Bld 83 70 - 99 mg/dL   BUN 24 (H) 8 - 23 mg/dL   Creatinine, Ser 1.83 (H) 0.44 - 1.00 mg/dL   Calcium 8.5 (L) 8.9 - 10.3 mg/dL   Total Protein 5.2 (L) 6.5 - 8.1 g/dL   Albumin 2.6 (L) 3.5 - 5.0 g/dL   AST 24 15 - 41 U/L   ALT 13 0 - 44 U/L   Alkaline Phosphatase 57 38 - 126 U/L   Total Bilirubin 1.3 (H) 0.3 - 1.2 mg/dL   GFR calc non Af Amer 23 (L) >60 mL/min   GFR calc Af Amer 27 (L) >60 mL/min   Anion gap 15 5 - 15    Comment: Performed at [facility], [place] [address], [place], [place] [address]  Lipase, blood     Status: None   Collection Time: 12/29/18  4:00 PM  Result Value Ref Range    Lipase 19 11 - 51 U/L    Comment: Performed at [facility] [address], [place], [place] [address]  CBC with Differential/Platelet     Status: Abnormal   Collection Time: 12/29/18  4:00 PM  Result Value Ref Range   WBC 7.6 4.0 - 10.5 K/uL   RBC 3.28 (L) 3.87 - 5.11 MIL/uL   Hemoglobin 10.6 (L) 12.0 - 15.0 g/dL   HCT 33.2 (L) 36.0 - 46.0 %   MCV 101.2 (H) 80.0 - 100.0 fL   MCH 32.3 26.0 - 34.0 pg   MCHC 31.9 30.0 - 36.0 g/dL   RDW 15.4 11.5 - 15.5 %   Platelets 330 150 - 400 K/uL   nRBC 0.0 0.0 - 0.2 %   Neutrophils Relative % 77 %   Neutro Abs 5.9 1.7 - 7.7 K/uL   Lymphocytes Relative 15 %   Lymphs Abs 1.1 0.7 - 4.0 K/uL   Monocytes Relative 6 %   Monocytes Absolute 0.4 0.1 - 1.0 K/uL   Eosinophils Relative 0 %   Eosinophils Absolute 0.0 0.0 - 0.5 K/uL   Basophils Relative 1 %   Basophils Absolute 0.0 0.0 - 0.1 K/uL   Immature Granulocytes 1 %   Abs Immature Granulocytes 0.04 0.00 - 0.07 K/uL    Comment: Performed at [facility], [address], [place], [place] [address]  SARS Coronavirus 2 (CEPHEID - Performed in [facility] hospital lab), Hosp Order     Status: None   Collection Time: 12/29/18  7:30 PM  Result Value Ref Range   SARS Coronavirus 2 NEGATIVE NEGATIVE    Comment: (NOTE) If result is NEGATIVE SARS-CoV-2 target nucleic acids are NOT DETECTED. The SARS-CoV-2 RNA is generally detectable in upper and lower  respiratory specimens during the acute phase of infection. The lowest  concentration of SARS-CoV-2 viral copies this assay can detect is 250  copies / mL. A negative result does not preclude SARS-CoV-2 infection  and should not be used as the sole basis for treatment or other  patient management decisions.  A negative result may occur with  improper specimen collection / handling, submission of specimen other  than nasopharyngeal swab, presence of viral mutation(s) within the  areas targeted by this assay, and inadequate number of viral  copies  (<250 copies / mL). A  negative result must be combined with clinical  observations, patient history, and epidemiological information. If result is POSITIVE SARS-CoV-2 target nucleic acids are DETECTED. The SARS-CoV-2 RNA is generally detectable in upper and lower  respiratory specimens dur ing the acute phase of infection.  Positive  results are indicative of active infection with SARS-CoV-2.  Clinical  correlation with patient history and other diagnostic information is  necessary to determine patient infection status.  Positive results do  not rule out bacterial infection or co-infection with other viruses. If result is PRESUMPTIVE POSTIVE SARS-CoV-2 nucleic acids MAY BE PRESENT.   A presumptive positive result was obtained on the submitted specimen  and confirmed on repeat testing.  While 2019 novel coronavirus  (SARS-CoV-2) nucleic acids may be present in the submitted sample  additional confirmatory testing may be necessary for epidemiological  and / or clinical management purposes  to differentiate between  SARS-CoV-2 and other Sarbecovirus currently known to infect humans.  If clinically indicated additional testing with an alternate test  methodology [id number]) is advised. The SARS-CoV-2 RNA is generally  detectable in upper and lower respiratory sp ecimens during the acute  phase of infection. The expected result is Negative. Fact Sheet for Patients:  BoilerBrush.com.cyhttps://www.fda.gov/media/136312/download Fact Sheet for Healthcare Providers: https://pope.com/https://www.fda.gov/media/136313/download This test is not yet approved or cleared by the [place] FDA and has been authorized for detection and/or diagnosis of SARS-CoV-2 by FDA under an Emergency Use Authorization (EUA).  This EUA will remain in effect (meaning this test can be used) for the duration of the COVID-19 declaration under Section 564(b)(1) of the Act, 21 U.S.C. section 360bbb-3(b)(1), unless the authorization is terminated  or revoked sooner. Performed at [facility], [address] [place], [place] [address]   Urinalysis, Routine w reflex microscopic     Status: Abnormal   Collection Time: 12/29/18  8:20 PM  Result Value Ref Range   Color, Urine YELLOW YELLOW   APPearance CLEAR CLEAR   Specific Gravity, Urine 1.017 1.005 - 1.030   pH 5.0 5.0 - 8.0   Glucose, UA NEGATIVE NEGATIVE mg/dL   Hgb urine dipstick NEGATIVE NEGATIVE   Bilirubin Urine NEGATIVE NEGATIVE   Ketones, ur 5 (A) NEGATIVE mg/dL   Protein, ur NEGATIVE NEGATIVE mg/dL   Nitrite NEGATIVE NEGATIVE   Leukocytes,Ua NEGATIVE NEGATIVE    Comment: Performed at [facility], [address] [place], [place] [address]   Ct Abdomen Pelvis Wo Contrast  Result Date: 12/29/2018 CLINICAL DATA:  85 year old female with failure to thrive and weakness. Reduced intake over the past 48 hours. EXAM: CT ABDOMEN AND PELVIS WITHOUT CONTRAST TECHNIQUE: Multidetector CT imaging of the abdomen and pelvis was performed following the standard protocol without IV contrast. COMPARISON:  CT the abdomen and pelvis 11/04/2017. FINDINGS: Lower chest: Trace left pleural effusion.  Aortic atherosclerosis. Hepatobiliary: No definite cystic or solid hepatic lesions are confidently identified on today's noncontrast CT examination. Gallbladder is unremarkable in appearance. Pancreas: Large low-attenuation lesion which appears centered in the head of the pancreas measuring up to 5.8 x 9.6 x 8.4 cm (axial image 30 of series 3 and coronal image 45 of series 6), similar to the prior examination. Additionally, in the right upper quadrant immediately anterolateral to this lesion there is a smaller 2.3 cm low-attenuation lesion which also appears similar to the prior examination (axial image 24 of series 3). Body and tail of the pancreas are mildly atrophic, but otherwise unremarkable in appearance. No peripancreatic inflammatory changes. Spleen:  Unremarkable. Adrenals/Urinary Tract:  Unenhanced appearance of the kidneys and bilateral adrenal glands is normal. No hydroureteronephrosis. Urinary bladder is normal in appearance. Stomach/Bowel: Normal appearance of the stomach. No pathologic dilatation of small bowel or colon. Numerous colonic diverticulae are noted, particularly in the descending colon and sigmoid colon, without surrounding inflammatory changes to suggest an acute diverticulitis at this time. Normal appendix. Vascular/Lymphatic: Aortic atherosclerosis. No lymphadenopathy noted in the abdomen or pelvis. Reproductive: Small calcified lesions associated with the uterus, presumably calcified fibroids. Ovaries are a trophic. Other: No significant volume of ascites.  No pneumoperitoneum. Musculoskeletal: There are no aggressive appearing lytic or blastic lesions noted in the visualized portions of the skeleton. IMPRESSION: 1. No acute findings are noted in the abdomen or pelvis to account for the patient's symptoms. 2. Severe colonic diverticulosis without evidence of acute diverticulitis at this time. 3. Large cystic lesions associated with the head of the pancreas grossly similar to prior examinations over the past several years, presumably benign pancreatic pseudocysts. 4. Trace left pleural effusion lying dependently. 5. Aortic atherosclerosis. 6. Additional incidental findings, as above. Electronically Signed   By: Trudie Reed M.D.   On: 12/29/2018 17:47   Ct Head Wo Contrast  Result Date: 12/29/2018 CLINICAL DATA:  85 year old female with history of failure to thrive. EXAM: CT HEAD WITHOUT CONTRAST TECHNIQUE: Contiguous axial images were obtained from the base of the skull through the vertex without intravenous contrast. COMPARISON:  Head CT 09/02/2018. FINDINGS: Brain: Patchy and confluent areas of decreased attenuation are noted throughout the deep and periventricular white matter of the cerebral hemispheres bilaterally, compatible with chronic microvascular ischemic  disease. Well-defined area of low attenuation in the left parietooccipital region, similar to prior study from 09/02/2018, compatible with an area of chronic encephalomalacia/gliosis from old left PCA territory infarction. No evidence of acute infarction, hemorrhage, hydrocephalus, extra-axial collection or mass lesion/mass effect. Vascular: No hyperdense vessel or unexpected calcification. Skull: Normal. Negative for fracture or focal lesion. Sinuses/Orbits: No acute finding. Other: None. IMPRESSION: 1. No acute intracranial abnormalities. 2. 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 Electronically Signed   By: Trudie Reed M.D.   On: 12/29/2018 17:39   Dg Chest Port 1 View  Result Date: 12/29/2018 CLINICAL DATA:  Weakness and poor appetite. EXAM: PORTABLE CHEST 1 VIEW COMPARISON:  Chest x-ray 10/03/2013 FINDINGS: The cardiac silhouette, mediastinal and hilar contours are within normal limits and stable. There is tortuosity and calcification of the thoracic aorta. The lungs are clear of an acute process. No worrisome pulmonary lesions. Mild a buckle scarring type changes. The bony thorax is intact. IMPRESSION: No acute cardiopulmonary findings. Electronically Signed   By: Rudie Meyer M.D.   On: 12/29/2018 15:09    Pending Labs Unresulted Labs (From admission, onward)    Start     Ordered   12/30/18 0500  CBC  Tomorrow morning,   R     12/29/18 2105   12/30/18 0500  Basic metabolic panel  Tomorrow morning,   R     12/29/18 2105   12/29/18 2110  Lactic acid, plasma  ONCE - STAT,   R     12/29/18 2109   12/29/18 1406  CBC with Differential/Platelet  Once,   R     12/29/18 1405          Vitals/Pain Today's Vitals   12/29/18 1945 12/29/18 2000 12/29/18 2015 12/29/18 2030  BP:  (!) 124/52  (!) 115/39  Pulse: 74 84 88   Resp: (!) 21 20 19 15   Temp:      TempSrc:      SpO2: 100% 99% 99%     Isolation Precautions No  active isolations  Medications Medications  sodium chloride flush (NS) 0.9 % injection 10-40 mL (has no administration in time range)  sodium chloride flush (NS) 0.9 % injection 10-40 mL (has no administration in time range)  acetaminophen (TYLENOL) tablet 650 mg (has no administration in time range)    Or  acetaminophen (TYLENOL) suppository 650 mg (has no administration in time range)  ondansetron (ZOFRAN) tablet 4 mg (has no administration in time range)    Or  ondansetron (ZOFRAN) injection 4 mg (has no administration in time range)  enoxaparin (LOVENOX) injection 40 mg (has no administration in time range)  HYDROcodone-acetaminophen (NORCO/VICODIN) 5-325 MG per tablet 1-2 tablet (has no administration in time range)  0.9 %  sodium chloride infusion (has no administration in time range)  aspirin EC tablet 81 mg (has no administration in time range)  clopidogrel (PLAVIX) tablet 75 mg (has no administration in time range)  atorvastatin (LIPITOR) tablet 10 mg (has no administration in time range)  linaclotide (LINZESS) capsule 145 mcg (has no administration in time range)  LORazepam (ATIVAN) tablet 0.5 mg (has no administration in time range)  omeprazole (PRILOSEC OTC) EC tablet 20 mg (has no administration in time range)  dorzolamide-timolol (COSOPT) 22.3-6.8 MG/ML ophthalmic solution 1 drop (has no administration in time range)  dextrose 50 % solution 50 mL (50 mLs Intravenous Given 12/29/18 1655)  sodium chloride 0.9 % bolus 500 mL (0 mLs Intravenous Stopped 12/29/18 2020)  sodium chloride 0.9 % bolus 500 mL (0 mLs Intravenous Stopped 12/29/18 2020)    Mobility walks with device Moderate fall risk   Focused Assessments failure to thrive   R Recommendations: See Admitting Provider Note  Report given to:   Additional Notes:

## 2018-12-29 NOTE — ED Notes (Signed)
Pt to be transported upstairs in hospital bed

## 2018-12-29 NOTE — Progress Notes (Signed)
CRITICAL VALUE ALERT  Critical Value:  Lactic 2.4  Date & Time Notied:  12/29/2018 2329  Provider Notified: Schorr  Orders Received/Actions taken: Awaiting orders

## 2018-12-29 NOTE — ED Notes (Signed)
Pt. Refusing In and Out Cath. Pt. To attempt to urinate

## 2018-12-29 NOTE — ED Notes (Signed)
Patient transported to CT 

## 2018-12-29 NOTE — ED Triage Notes (Signed)
Failure to thrive from home. Weakness and reduced intake x48 hours. Note in progress.Marland KitchenMarland Kitchen

## 2018-12-29 NOTE — ED Provider Notes (Signed)
[facility] EMERGENCY DEPARTMENT Provider Note   CSN: [id number] Arrival date & time: 12/29/18  1355    History   Chief Complaint No chief complaint on file.   HPI [name] is a 85 y.o. female.     HPI Patient presents by EMS for generalized weakness and failure to thrive at home.  Worsening over the last few days.  Patient states she is had decreased oral intake due to lack of appetite.  She is had multiple falls but denies hitting her head.  She notes generalized weakness.  Had one episode of vomiting today.  Complains of diffuse abdominal pain.  Has not had a bowel movement for several days.  Denies focal weakness or numbness.  No fever or chills.  Denies chest pain or shortness of breath. Past Medical History:  Diagnosis Date   Acid reflux    Bladder disease    Cardiomyopathy    Constipation    Diverticulosis    Interstitial cystitis    Renal disorder     Patient Active Problem List   Diagnosis Date Noted   Nausea & vomiting 12/29/2018   Acute blood loss anemia 09/04/2018   Hyperlipemia 09/04/2018   Cardiomyopathy (HCC) 09/04/2018   CKD (chronic kidney disease), stage III (HCC) 09/04/2018   Stroke due to embolism of left middle cerebral artery (HCC) 09/02/2018   Middle cerebral artery embolism, left 09/02/2018   Dehydration 01/29/2018   Slow transit constipation 01/29/2018   Essential hypertension 01/29/2018   Constipation 01/29/2018    Past Surgical History:  Procedure Laterality Date   HERNIA REPAIR     IR CT HEAD LTD  09/02/2018   IR PERCUTANEOUS ART THROMBECTOMY/INFUSION INTRACRANIAL INC DIAG ANGIO  09/02/2018   RADIOLOGY WITH ANESTHESIA N/A 09/02/2018   Procedure: IR WITH ANESTHESIA;  Surgeon: Julieanne Cotton, MD;  Location: [facility] OR;  Service: Radiology;  Laterality: N/A;     OB History   No obstetric history on file.      Home Medications    Prior to Admission medications   Medication Sig Start  Date End Date Taking? Authorizing Provider  aspirin EC 81 MG EC tablet Take 1 tablet (81 mg total) by mouth daily. 09/06/18   Layne Benton, NP  atorvastatin (LIPITOR) 10 MG tablet Take 1 tablet (10 mg total) by mouth daily at 6 PM. 09/05/18   Layne Benton, NP  clopidogrel (PLAVIX) 75 MG tablet Take 1 tablet (75 mg total) by mouth daily. 09/06/18   Layne Benton, NP  dorzolamide-timolol (COSOPT) 22.3-6.8 MG/ML ophthalmic solution Place 1 drop into both eyes 2 (two) times daily.    [provider]  HYDROcodone-acetaminophen (NORCO/VICODIN) 5-325 MG tablet Take 1 tablet by mouth every 4 (four) hours as needed for moderate pain. for pain 12/19/17   [provider]  LINZESS 145 MCG CAPS capsule Take 145 mcg by mouth every morning. As needed  for constipation 06/02/18   [provider]  LORazepam (ATIVAN) 0.5 MG tablet Take 0.5 mg by mouth 2 (two) times daily as needed for anxiety or sleep.  12/19/17   [provider]  omeprazole (PRILOSEC OTC) 20 MG tablet Take 20 mg by mouth daily as needed (acid reflux).    [provider]  shark liver oil-cocoa butter (PREPARATION H) 0.25-3-85.5 % suppository Place 1 suppository rectally as needed for hemorrhoids.    [provider]  Sodium Phosphates (FLEET ENEMA RE) Place 1 application rectally 2 (two) times daily as  needed (constipation).    [provider]  timolol (TIMOPTIC) 0.25 % ophthalmic solution Place 1 drop into both eyes 2 (two) times daily. 08/14/18   [provider]    Family History Family History  Family history unknown: Yes    Social History Social History   Tobacco Use   Smoking status: Never Smoker   Smokeless tobacco: Never Used  Substance Use Topics   Alcohol use: No   Drug use: No     Allergies   Patient has no known allergies.   Review of Systems Review of Systems  Constitutional: Positive for appetite change and fatigue. Negative for fever.  Eyes:  Negative for visual disturbance.  Respiratory: Negative for cough and shortness of breath.   Cardiovascular: Negative for chest pain.  Gastrointestinal: Positive for abdominal pain, constipation, nausea and vomiting. Negative for diarrhea.  Genitourinary: Negative for dysuria, flank pain and frequency.  Musculoskeletal: Negative for back pain and neck pain.  Skin: Negative for rash and wound.  Neurological: Positive for weakness. Negative for dizziness, light-headedness, numbness and headaches.  All other systems reviewed and are negative.    Physical Exam Updated Vital Signs BP (!) 129/45    Pulse 88    Temp (!) 96.9 F (36.1 C) (Rectal)    Resp 20    SpO2 100%   Physical Exam Vitals signs and nursing note reviewed.  Constitutional:      Appearance: She is well-developed.     Comments: Chronically ill-appearing.  Pale.  HENT:     Head: Normocephalic and atraumatic.     Comments: No facial asymmetries.    Nose: Nose normal.     Mouth/Throat:     Mouth: Mucous membranes are moist.  Eyes:     Pupils: Pupils are equal, round, and reactive to light.  Neck:     Musculoskeletal: Normal range of motion and neck supple. No neck rigidity or muscular tenderness.     Comments: No posterior midline cervical tenderness palpation Cardiovascular:     Rate and Rhythm: Normal rate and regular rhythm.     Heart sounds: Murmur present.  Pulmonary:     Effort: Pulmonary effort is normal. No respiratory distress.     Breath sounds: Normal breath sounds. No stridor. No wheezing, rhonchi or rales.  Chest:     Chest wall: No tenderness.  Abdominal:     General: Bowel sounds are normal. There is distension.     Palpations: Abdomen is soft.     Tenderness: There is abdominal tenderness. There is no guarding or rebound.     Comments: Diffuse abdominal tenderness to palpation.  No rebound or guarding.  Musculoskeletal: Normal range of motion.        General: No swelling, tenderness, deformity or  signs of injury.     Right lower leg: No edema.     Left lower leg: No edema.  Lymphadenopathy:     Cervical: No cervical adenopathy.  Skin:    General: Skin is warm and dry.     Capillary Refill: Capillary refill takes less than 2 seconds.     Coloration: Skin is pale.     Findings: No erythema or rash.  Neurological:     General: No focal deficit present.     Mental Status: She is alert and oriented to person, place, and time.     Comments: Generally weak with 4/5 motor in all extremities.  Sensation to light touch intact.  Psychiatric:  Behavior: Behavior normal.      ED Treatments / Results  Labs (all labs ordered are listed, but only abnormal results are displayed) Labs Reviewed  URINALYSIS, ROUTINE W REFLEX MICROSCOPIC - Abnormal; Notable for the following components:      Result Value   Ketones, ur 5 (*)    All other components within normal limits  COMPREHENSIVE METABOLIC PANEL - Abnormal; Notable for the following components:   CO2 18 (*)    BUN 24 (*)    Creatinine, Ser 1.83 (*)    Calcium 8.5 (*)    Total Protein 5.2 (*)    Albumin 2.6 (*)    Total Bilirubin 1.3 (*)    GFR calc non Af Amer 23 (*)    GFR calc Af Amer 27 (*)    All other components within normal limits  CBC WITH DIFFERENTIAL/PLATELET - Abnormal; Notable for the following components:   RBC 3.28 (*)    Hemoglobin 10.6 (*)    HCT 33.2 (*)    MCV 101.2 (*)    All other components within normal limits  I-STAT CHEM 8, ED - Abnormal; Notable for the following components:   Sodium 130 (*)    Potassium 5.3 (*)    BUN 24 (*)    Creatinine, Ser 1.50 (*)    Glucose, Bld 66 (*)    Calcium, Ion 0.97 (*)    TCO2 13 (*)    All other components within normal limits  SARS CORONAVIRUS 2 (HOSPITAL ORDER, PERFORMED IN [facility])  LIPASE, BLOOD  CBC WITH DIFFERENTIAL/PLATELET  CBC  BASIC METABOLIC PANEL  LACTIC ACID, PLASMA    EKG EKG Interpretation  Date/Time:  Monday December 29 2018 14:39:15 EDT Ventricular Rate:  69 PR Interval:    QRS Duration: 81 QT Interval:  384 QTC Calculation: 412 R Axis:   52 Text Interpretation:  Sinus rhythm Ventricular premature complex Prolonged PR interval Probable left atrial enlargement Probable LVH with secondary repol abnrm Confirmed by Loren RacerYelverton, Kalvin Buss ([id number]) on 12/29/2018 3:47:17 PM   Radiology Ct Abdomen Pelvis Wo Contrast  Result Date: 12/29/2018 CLINICAL DATA:  85 year old female with failure to thrive and weakness. Reduced intake over the past 48 hours. EXAM: CT ABDOMEN AND PELVIS WITHOUT CONTRAST TECHNIQUE: Multidetector CT imaging of the abdomen and pelvis was performed following the standard protocol without IV contrast. COMPARISON:  CT the abdomen and pelvis 11/04/2017. FINDINGS: Lower chest: Trace left pleural effusion.  Aortic atherosclerosis. Hepatobiliary: No definite cystic or solid hepatic lesions are confidently identified on today's noncontrast CT examination. Gallbladder is unremarkable in appearance. Pancreas: Large low-attenuation lesion which appears centered in the head of the pancreas measuring up to 5.8 x 9.6 x 8.4 cm (axial image 30 of series 3 and coronal image 45 of series 6), similar to the prior examination. Additionally, in the right upper quadrant immediately anterolateral to this lesion there is a smaller 2.3 cm low-attenuation lesion which also appears similar to the prior examination (axial image 24 of series 3). Body and tail of the pancreas are mildly atrophic, but otherwise unremarkable in appearance. No peripancreatic inflammatory changes. Spleen: Unremarkable. Adrenals/Urinary Tract: Unenhanced appearance of the kidneys and bilateral adrenal glands is normal. No hydroureteronephrosis. Urinary bladder is normal in appearance. Stomach/Bowel: Normal appearance of the stomach. No pathologic dilatation of small bowel or colon. Numerous colonic diverticulae are noted, particularly in the descending colon and  sigmoid colon, without surrounding inflammatory changes to suggest an acute diverticulitis at this time.  Normal appendix. Vascular/Lymphatic: Aortic atherosclerosis. No lymphadenopathy noted in the abdomen or pelvis. Reproductive: Small calcified lesions associated with the uterus, presumably calcified fibroids. Ovaries are a trophic. Other: No significant volume of ascites.  No pneumoperitoneum. Musculoskeletal: There are no aggressive appearing lytic or blastic lesions noted in the visualized portions of the skeleton. IMPRESSION: 1. No acute findings are noted in the abdomen or pelvis to account for the patient's symptoms. 2. Severe colonic diverticulosis without evidence of acute diverticulitis at this time. 3. Large cystic lesions associated with the head of the pancreas grossly similar to prior examinations over the past several years, presumably benign pancreatic pseudocysts. 4. Trace left pleural effusion lying dependently. 5. Aortic atherosclerosis. 6. Additional incidental findings, as above. Electronically Signed   By: Trudie Reedaniel  Entrikin M.D.   On: 12/29/2018 17:47   Ct Head Wo Contrast  Result Date: 12/29/2018 CLINICAL DATA:  85 year old female with history of failure to thrive. EXAM: CT HEAD WITHOUT CONTRAST TECHNIQUE: Contiguous axial images were obtained from the base of the skull through the vertex without intravenous contrast. COMPARISON:  Head CT 09/02/2018. FINDINGS: Brain: Patchy and confluent areas of decreased attenuation are noted throughout the deep and periventricular white matter of the cerebral hemispheres bilaterally, compatible with chronic microvascular ischemic disease. Well-defined area of low attenuation in the left parietooccipital region, similar to prior study from 09/02/2018, compatible with an area of chronic encephalomalacia/gliosis from old left PCA territory infarction. No evidence of acute infarction, hemorrhage, hydrocephalus, extra-axial collection or mass lesion/mass  effect. Vascular: No hyperdense vessel or unexpected calcification. Skull: Normal. Negative for fracture or focal lesion. Sinuses/Orbits: No acute finding. Other: None. IMPRESSION: 1. No acute intracranial abnormalities. 2. 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 Electronically Signed   By: Trudie Reedaniel  Entrikin M.D.   On: 12/29/2018 17:39   Dg Chest Port 1 View  Result Date: 12/29/2018 CLINICAL DATA:  Weakness and poor appetite. EXAM: PORTABLE CHEST 1 VIEW COMPARISON:  Chest x-ray 10/03/2013 FINDINGS: The cardiac silhouette, mediastinal and hilar contours are within normal limits and stable. There is tortuosity and calcification of the thoracic aorta. The lungs are clear of an acute process. No worrisome pulmonary lesions. Mild a buckle scarring type changes. The bony thorax is intact. IMPRESSION: No acute cardiopulmonary findings. Electronically Signed   By: Rudie MeyerP.  Gallerani M.D.   On: 12/29/2018 15:09    Procedures Procedures (including critical care time)  Medications Ordered in ED Medications  sodium chloride flush (NS) 0.9 % injection 10-40 mL (has no administration in time range)  sodium chloride flush (NS) 0.9 % injection 10-40 mL (has no administration in time range)  acetaminophen (TYLENOL) tablet 650 mg (has no administration in time range)    Or  acetaminophen (TYLENOL) suppository 650 mg (has no administration in time range)  ondansetron (ZOFRAN) tablet 4 mg (has no administration in time range)    Or  ondansetron (ZOFRAN) injection 4 mg (has no administration in time range)  enoxaparin (LOVENOX) injection 40 mg (has no administration in time range)  HYDROcodone-acetaminophen (NORCO/VICODIN) 5-325 MG per tablet 1-2 tablet (has no administration in time range)  0.9 %  sodium chloride infusion (has no administration in time range)  aspirin EC tablet 81 mg (has no administration in time range)  clopidogrel (PLAVIX) tablet 75  mg (has no administration in time range)  atorvastatin (LIPITOR) tablet 10 mg (has no administration in time range)  linaclotide (LINZESS) capsule 145 mcg (has no administration in  time range)  LORazepam (ATIVAN) tablet 0.5 mg (has no administration in time range)  omeprazole (PRILOSEC OTC) EC tablet 20 mg (has no administration in time range)  dorzolamide-timolol (COSOPT) 22.3-6.8 MG/ML ophthalmic solution 1 drop (has no administration in time range)  dextrose 50 % solution 50 mL (50 mLs Intravenous Given 12/29/18 1655)  sodium chloride 0.9 % bolus 500 mL (0 mLs Intravenous Stopped 12/29/18 2020)  sodium chloride 0.9 % bolus 500 mL (0 mLs Intravenous Stopped 12/29/18 2020)     Initial Impression / Assessment and Plan / ED Course  I have reviewed the triage vital signs and the nursing notes.  Pertinent labs & imaging results that were available during my care of the patient were reviewed by me and considered in my medical decision making (see chart for details).       Work-up is essentially negative.  Mild elevation in creatinine.  Suspect this is likely due to dehydration.  Given poor p.o. intake I think she may benefit from IV hydration and observation overnight.  Discussed with hospitalist.   Final Clinical Impressions(s) / ED Diagnoses   Final diagnoses:  Dehydration  Adult failure to thrive    ED Discharge Orders    None       Julianne Rice, MD 12/29/18 2132

## 2018-12-29 NOTE — ED Notes (Signed)
Pt's caregiver Virgilio Belling) left number for updates [phone]

## 2018-12-30 DIAGNOSIS — Z515 Encounter for palliative care: Secondary | ICD-10-CM

## 2018-12-30 DIAGNOSIS — Z7189 Other specified counseling: Secondary | ICD-10-CM

## 2018-12-30 DIAGNOSIS — Z7982 Long term (current) use of aspirin: Secondary | ICD-10-CM | POA: Diagnosis not present

## 2018-12-30 DIAGNOSIS — E860 Dehydration: Secondary | ICD-10-CM | POA: Diagnosis present

## 2018-12-30 DIAGNOSIS — D631 Anemia in chronic kidney disease: Secondary | ICD-10-CM | POA: Diagnosis present

## 2018-12-30 DIAGNOSIS — Z79891 Long term (current) use of opiate analgesic: Secondary | ICD-10-CM | POA: Diagnosis not present

## 2018-12-30 DIAGNOSIS — E872 Acidosis: Secondary | ICD-10-CM | POA: Diagnosis present

## 2018-12-30 DIAGNOSIS — H9201 Otalgia, right ear: Secondary | ICD-10-CM | POA: Diagnosis present

## 2018-12-30 DIAGNOSIS — Z79899 Other long term (current) drug therapy: Secondary | ICD-10-CM | POA: Diagnosis not present

## 2018-12-30 DIAGNOSIS — N183 Chronic kidney disease, stage 3 (moderate): Secondary | ICD-10-CM | POA: Diagnosis present

## 2018-12-30 DIAGNOSIS — R112 Nausea with vomiting, unspecified: Secondary | ICD-10-CM | POA: Diagnosis not present

## 2018-12-30 DIAGNOSIS — K5909 Other constipation: Secondary | ICD-10-CM | POA: Diagnosis present

## 2018-12-30 DIAGNOSIS — N179 Acute kidney failure, unspecified: Principal | ICD-10-CM

## 2018-12-30 DIAGNOSIS — E785 Hyperlipidemia, unspecified: Secondary | ICD-10-CM | POA: Diagnosis present

## 2018-12-30 DIAGNOSIS — Z681 Body mass index (BMI) 19 or less, adult: Secondary | ICD-10-CM | POA: Diagnosis not present

## 2018-12-30 DIAGNOSIS — R339 Retention of urine, unspecified: Secondary | ICD-10-CM | POA: Diagnosis present

## 2018-12-30 DIAGNOSIS — Z1159 Encounter for screening for other viral diseases: Secondary | ICD-10-CM | POA: Diagnosis not present

## 2018-12-30 DIAGNOSIS — Z8673 Personal history of transient ischemic attack (TIA), and cerebral infarction without residual deficits: Secondary | ICD-10-CM | POA: Diagnosis not present

## 2018-12-30 DIAGNOSIS — R634 Abnormal weight loss: Secondary | ICD-10-CM | POA: Diagnosis present

## 2018-12-30 DIAGNOSIS — D638 Anemia in other chronic diseases classified elsewhere: Secondary | ICD-10-CM | POA: Diagnosis present

## 2018-12-30 DIAGNOSIS — N189 Chronic kidney disease, unspecified: Secondary | ICD-10-CM

## 2018-12-30 DIAGNOSIS — R296 Repeated falls: Secondary | ICD-10-CM | POA: Diagnosis present

## 2018-12-30 DIAGNOSIS — Z66 Do not resuscitate: Secondary | ICD-10-CM | POA: Diagnosis present

## 2018-12-30 DIAGNOSIS — K5730 Diverticulosis of large intestine without perforation or abscess without bleeding: Secondary | ICD-10-CM | POA: Diagnosis present

## 2018-12-30 DIAGNOSIS — K219 Gastro-esophageal reflux disease without esophagitis: Secondary | ICD-10-CM | POA: Diagnosis present

## 2018-12-30 DIAGNOSIS — R627 Adult failure to thrive: Secondary | ICD-10-CM

## 2018-12-30 LAB — CBC
HCT: 30 % — ABNORMAL LOW (ref 36.0–46.0)
Hemoglobin: 9.5 g/dL — ABNORMAL LOW (ref 12.0–15.0)
MCH: 31.5 pg (ref 26.0–34.0)
MCHC: 31.7 g/dL (ref 30.0–36.0)
MCV: 99.3 fL (ref 80.0–100.0)
Platelets: 326 10*3/uL (ref 150–400)
RBC: 3.02 MIL/uL — ABNORMAL LOW (ref 3.87–5.11)
RDW: 15.4 % (ref 11.5–15.5)
WBC: 7.1 10*3/uL (ref 4.0–10.5)
nRBC: 0 % (ref 0.0–0.2)

## 2018-12-30 LAB — BASIC METABOLIC PANEL
Anion gap: 12 (ref 5–15)
BUN: 23 mg/dL (ref 8–23)
CO2: 16 mmol/L — ABNORMAL LOW (ref 22–32)
Calcium: 8.1 mg/dL — ABNORMAL LOW (ref 8.9–10.3)
Chloride: 107 mmol/L (ref 98–111)
Creatinine, Ser: 1.65 mg/dL — ABNORMAL HIGH (ref 0.44–1.00)
GFR calc Af Amer: 30 mL/min — ABNORMAL LOW (ref >60)
GFR calc non Af Amer: 26 mL/min — ABNORMAL LOW (ref >60)
Glucose, Bld: 82 mg/dL (ref 70–99)
Potassium: 4.8 mmol/L (ref 3.5–5.1)
Sodium: 135 mmol/L (ref 135–145)

## 2018-12-30 LAB — LACTIC ACID, PLASMA
Lactic Acid, Venous: 2.3 mmol/L (ref 0.5–1.9)
Lactic Acid, Venous: 1.4 mmol/L (ref 0.5–1.9)

## 2018-12-30 MED ORDER — SENNA 8.6 MG PO TABS
2.0000 | ORAL_TABLET | Freq: Every day | ORAL | Status: DC
  Administered 2018-12-30 – 2019-01-01 (×3): 17.2 mg via ORAL
  Filled 2018-12-30 (×3): qty 2

## 2018-12-30 MED ORDER — HYDROCODONE-ACETAMINOPHEN 5-325 MG PO TABS: 1.0000 | ORAL_TABLET | Freq: Two times a day (BID) | ORAL | Status: DC | PRN

## 2018-12-30 MED ORDER — BOOST / RESOURCE BREEZE PO LIQD CUSTOM
1.0000 | Freq: Three times a day (TID) | ORAL | Status: DC
  Administered 2019-01-01: 1 via ORAL

## 2018-12-30 MED ORDER — SODIUM BICARBONATE 650 MG PO TABS
650.0000 mg | ORAL_TABLET | Freq: Two times a day (BID) | ORAL | Status: DC
  Administered 2018-12-30 – 2019-01-01 (×4): 650 mg via ORAL
  Filled 2018-12-30 (×4): qty 1

## 2018-12-30 MED ORDER — SODIUM CHLORIDE 0.9 % IV BOLUS
500.0000 mL | Freq: Once | INTRAVENOUS | Status: AC
  Administered 2018-12-30: 500 mL via INTRAVENOUS

## 2018-12-30 NOTE — Progress Notes (Signed)
Spoke with daughter [name], given update. Will wait for call from physician.

## 2018-12-30 NOTE — Evaluation (Signed)
Physical Therapy Evaluation Patient Details Name: [name] MRN: [id number] DOB: Jan 24, 1921 Today's Date: 12/30/2018   History of Present Illness  Pt is a 85 y/o female admitted secondary to worsening nausea and vomiting and weakness. Nausea and vomiting of unclear etiology. PMH includes CVA s/p mechanical thrombectomy and CKD.   Clinical Impression  Pt admitted secondary to problem above with deficits. Pt requiring mod A for bed mobility and max A to take side steps at the EOB. Pt very fearful of falling and reports he has had multiple falls within the past week. Feel pt would benefit from SNF level therapies at d/c, however, if family decides to take pt home, will likely require use of WC and 24/7 support. Will continue to follow acutely to maximize functional mobility independence and safety.     Follow Up Recommendations SNF;Supervision/Assistance - 24 hour    Equipment Recommendations  None recommended by PT    Recommendations for Other Services       Precautions / Restrictions Precautions Precautions: Fall Precaution Comments: Multiple falls within the past week.  Restrictions Weight Bearing Restrictions: No      Mobility  Bed Mobility Overal bed mobility: Needs Assistance Bed Mobility: Supine to Sit;Sit to Supine     Supine to sit: Mod assist Sit to supine: Mod assist   General bed mobility comments: Mod A for trunk elevation and LE assist to sit at EOB. Required mod A for LE assist for return to supine.   Transfers Overall transfer level: Needs assistance Equipment used: 1 person hand held assist Transfers: Sit to/from Stand Sit to Stand: Mod assist         General transfer comment: Mod A for lift assist and steadying to stand. PT stood in front of pt and pt held on to PT arms.   Ambulation/Gait Ambulation/Gait assistance: Max assist   Assistive device: 1 person hand held assist       General Gait Details: Performed 1-2 side steps along the EOB.  Pt very fearful of falling and requesting to sit early. Required max A for steadying assist.   Stairs            Wheelchair Mobility    Modified Rankin (Stroke Patients Only)       Balance Overall balance assessment: Needs assistance Sitting-balance support: No upper extremity supported;Feet supported Sitting balance-Leahy Scale: Fair     Standing balance support: Bilateral upper extremity supported;During functional activity Standing balance-Leahy Scale: Poor Standing balance comment: Heavy reliance on UE and external support                              Pertinent Vitals/Pain Pain Assessment: No/denies pain    Home Living Family/patient expects to be discharged to:: Private residence Living Arrangements: Children Available Help at Discharge: Family;Personal care attendant;Available 24 hours/day Type of Home: House Home Access: Stairs to enter Entrance Stairs-Rails: Right Entrance Stairs-Number of Steps: 4 Home Layout: One level Home Equipment: Walker - 2 wheels;Cane - single point;Shower seat;Wheelchair - manual      Prior Function Level of Independence: Needs assistance   Gait / Transfers Assistance Needed: Reports aide has to help with ambulation with RW. Has had multiple falls.   ADL's / Homemaking Assistance Needed: Reports aide helps with all ADL tasks.         Hand Dominance        Extremity/Trunk Assessment   Upper Extremity Assessment Upper Extremity Assessment:  Generalized weakness    Lower Extremity Assessment Lower Extremity Assessment: Generalized weakness    Cervical / Trunk Assessment Cervical / Trunk Assessment: Kyphotic  Communication   Communication: No difficulties  Cognition Arousal/Alertness: Awake/alert Behavior During Therapy: WFL for tasks assessed/performed Overall Cognitive Status: No family/caregiver present to determine baseline cognitive functioning                                         General Comments      Exercises     Assessment/Plan    PT Assessment Patient needs continued PT services  PT Problem List Decreased strength;Decreased activity tolerance;Decreased balance;Decreased mobility;Decreased knowledge of use of DME;Decreased knowledge of precautions;Decreased cognition       PT Treatment Interventions DME instruction;Gait training;Functional mobility training;Therapeutic activities;Therapeutic exercise;Balance training;Patient/family education    PT Goals (Current goals can be found in the Care Plan section)  Acute Rehab PT Goals Patient Stated Goal: to eat dinner PT Goal Formulation: With patient Time For Goal Achievement: 01/13/19 Potential to Achieve Goals: Fair    Frequency Min 2X/week   Barriers to discharge        Co-evaluation               AM-PAC PT "6 Clicks" Mobility  Outcome Measure Help needed turning from your back to your side while in a flat bed without using bedrails?: A Lot Help needed moving from lying on your back to sitting on the side of a flat bed without using bedrails?: A Lot Help needed moving to and from a bed to a chair (including a wheelchair)?: Total Help needed standing up from a chair using your arms (e.g., wheelchair or bedside chair)?: A Lot Help needed to walk in hospital room?: Total Help needed climbing 3-5 steps with a railing? : Total 6 Click Score: 9    End of Session Equipment Utilized During Treatment: Gait belt Activity Tolerance: Patient limited by fatigue Patient left: in bed;with call bell/phone within reach;with bed alarm set Nurse Communication: Mobility status PT Visit Diagnosis: History of falling (Z91.81);Repeated falls (R29.6);Muscle weakness (generalized) (M62.81);Unsteadiness on feet (R26.81)    Time: 9937-1696 PT Time Calculation (min) (ACUTE ONLY): 20 min   Charges:   PT Evaluation $PT Eval Moderate Complexity: 1 Mod          Leighton Ruff, PT, DPT  Acute  Rehabilitation Services  Pager: [phone] Office: [phone]   Rudean Hitt 12/30/2018, 6:15 PM

## 2018-12-30 NOTE — Progress Notes (Addendum)
PROGRESS NOTE   [name]  QIH:[id number]    DOB: 1921/05/30    DOA: 12/29/2018  PCP: Vincente Liberty, MD   I have briefly reviewed patients previous medical records in [facility].  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  CT abdomen and pelvis and CT head without acute abnormalities.  Admitted for abdominal pain, nausea, vomiting, associated dehydration, non-anion gap metabolic acidosis and acute urinary retention.  Failure to thrive.  Palliative care consulted for goals of care.   Assessment & Plan:   Principal Problem:   Nausea & vomiting Active Problems:   CKD (chronic kidney disease), stage III ([place])   1. Nausea, vomiting and abdominal pain: Unclear etiology.  CT abdomen and pelvis: No acute findings, severe colonic diverticulosis without diverticulitis and benign pancreatic pseudocyst.  DD: Acute viral GE versus constipation (no stool burden mentioned in CT) versus others.  No further episodes but refuses to eat due to no appetite.  Encourage oral intake.  Continue gentle IV fluids.  Bowel regimen.  Minimize opioids.  SARS coronavirus 2: Negative.  Urine microscopy negative for UTI features. 2. Acute urinary retention: Bladder scan showed >500 mL.  In and out cath PRN.  Mobilize and monitor closely. 3. Acute on stage III chronic kidney disease: Likely precipitated by poor  oral intake and GI losses.  Baseline creatinine possibly in the 1.6 range.  Presented with creatinine of 1.83, improved to 1.6.  At risk for recurrence due to lack of oral intake.  IV fluids. 4. Non-anion gap metabolic acidosis: May be related to chronic kidney disease.  Lactate has normalized.  Added sodium bicarbonate tablets.  Follow BMP in a.m. 5. Right earache: Unclear etiology.  No acute findings on otoscopy except some cerumen without impaction.  Monitor. 6. Anemia of chronic disease versus chronic kidney disease: Stable compared to prior numbers.  Follow CBCs. 7. Hyperlipidemia: Atorvastatin. 8. Essential hypertension: Controlled. 9. Recent acute stroke: CT head without acute findings.  No focal deficits.  Continue aspirin 81 mg daily. 10. Chronic constipation: Bowel regimen and continue Linzess. 11. GERD: PPI. 12. Adult Failure to Thrive: I discussed in detail with patient's daughter who indicated that patient has been rapidly declining since her stroke in February.  She has about 18 pounds weight loss, refuses to eat, has not been out of bed for almost a week, has memory impairment and confusion, was DNR at home.  Daughter is absolutely agreeable to palliative care consultation for goals of care.  Did discuss possibilities of going home with hospice which she is open to.   DVT prophylaxis: Lovenox Code Status: DNR-confirmed with patient's daughter Family Communication: I discussed in detail with patient's daughter, updated care and answered questions. Disposition: To be determined pending clinical improvement, PT and OT evaluation.  Elderly female with multiple severe comorbidities, presented with acute on chronic kidney disease in the context of poor oral intake, abdominal pain, nausea and  vomiting and frequent falls, hospital course complicated by ongoing poor oral intake, acute urinary retention and right earache.  Continue IV fluids while attempting to increase oral intake, monitor for  recurrence of urinary retention and hopefully does not require Foley catheter, PT eval given frequent falls.  Given all of this patient will need to stay at least another night and is currently unsafe to discharge home with high risk for recurrent decline and rehospitalization.  Thereby will change status to inpatient.   Consultants:  None  Procedures:  In and out catheterization  Antimicrobials:  None   Subjective: States that she has no appetite.  Denies nausea, vomiting or abdominal pain.  Reports intermittent right earache without drainage or hearing loss.  Rest of history as noted above.  ROS: As above, otherwise negative.  Objective:  Vitals:   12/29/18 2130 12/29/18 2233 12/29/18 2249 12/30/18 0642  BP: (!) 129/45 (!) 114/50  (!) 133/51  Pulse: 88 82  72  Resp: 20     Temp:  97.8 F (36.6 C)  98.4 F (36.9 C)  TempSrc:  Oral  Oral  SpO2: 100% 100%  100%  Weight:   51.8 kg     Examination:  General exam: Pleasant elderly female, moderately built and frail, lying comfortably propped up in bed.  Oral mucosa with borderline hydration Respiratory system: Clear to auscultation. Respiratory effort normal. Cardiovascular system: S1 & S2 heard, RRR. No JVD, murmurs, rubs, gallops or clicks. No pedal edema. Gastrointestinal system: Abdomen is nondistended, soft and nontender.  Bladder palpable suprapubically but no tenderness.  No other organomegaly or masses appreciated.  Bowel sounds normally heard. Central nervous system: Alert and oriented self, place, partly to time and to president. No focal neurological deficits. Extremities: Symmetric 5 x 5 power. Skin: No rashes, lesions or ulcers Psychiatry: Judgement and insight appear somewhat impaired. Mood & affect appropriate. Right ear: Unable to clearly exam due to some cerumen in auditory canal but of what was visualized, nothing acute noted.    Data Reviewed: I have personally reviewed following labs and imaging  studies  CBC: Recent Labs  Lab 12/29/18 1512 12/29/18 1600 12/30/18 0339  WBC  --  7.6 7.1  NEUTROABS  --  5.9  --   HGB 12.2 10.6* 9.5*  HCT 36.0 33.2* 30.0*  MCV  --  101.2* 99.3  PLT  --  330 326   Basic Metabolic Panel: Recent Labs  Lab 12/29/18 1512 12/29/18 1600 12/30/18 0339  NA 130* 135 135  K 5.3* 5.0 4.8  CL 109 102 107  CO2  --  18* 16*  GLUCOSE 66* 83 82  BUN 24* 24* 23  CREATININE 1.50* 1.83* 1.65*  CALCIUM  --  8.5* 8.1*   Liver Function Tests: Recent Labs  Lab 12/29/18 1600  AST 24  ALT 13  ALKPHOS 57  BILITOT 1.3*  PROT 5.2*  ALBUMIN 2.6*     Recent Results (from the past 240 hour(s))  SARS Coronavirus 2 (CEPHEID - Performed in [facility] lab), Hosp Order     Status: None   Collection Time: 12/29/18  7:30 PM  Result Value Ref Range Status   SARS Coronavirus 2 NEGATIVE NEGATIVE Final    Comment: (NOTE) If result is NEGATIVE SARS-CoV-2 target nucleic acids are NOT DETECTED. The SARS-CoV-2 RNA is generally detectable in upper and lower  respiratory specimens during the acute phase of infection. The lowest  concentration of SARS-CoV-2 viral copies this assay can detect is  250  copies / mL. A negative result does not preclude SARS-CoV-2 infection  and should not be used as the sole basis for treatment or other  patient management decisions.  A negative result may occur with  improper specimen collection / handling, submission of specimen other  than nasopharyngeal swab, presence of viral mutation(s) within the  areas targeted by this assay, and inadequate number of viral copies  (<250 copies / mL). A negative result must be combined with clinical  observations, patient history, and epidemiological information. If result is POSITIVE SARS-CoV-2 target nucleic acids are DETECTED. The SARS-CoV-2 RNA is generally detectable in upper and lower  respiratory specimens dur ing the acute phase of infection.  Positive  results are  indicative of active infection with SARS-CoV-2.  Clinical  correlation with patient history and other diagnostic information is  necessary to determine patient infection status.  Positive results do  not rule out bacterial infection or co-infection with other viruses. If result is PRESUMPTIVE POSTIVE SARS-CoV-2 nucleic acids MAY BE PRESENT.   A presumptive positive result was obtained on the submitted specimen  and confirmed on repeat testing.  While 2019 novel coronavirus  (SARS-CoV-2) nucleic acids may be present in the submitted sample  additional confirmatory testing may be necessary for epidemiological  and / or clinical management purposes  to differentiate between  SARS-CoV-2 and other Sarbecovirus currently known to infect humans.  If clinically indicated additional testing with an alternate test  methodology [id number]) is advised. The SARS-CoV-2 RNA is generally  detectable in upper and lower respiratory sp ecimens during the acute  phase of infection. The expected result is Negative. Fact Sheet for Patients:  BoilerBrush.com.cyhttps://www.fda.gov/media/136312/download Fact Sheet for Healthcare Providers: https://pope.com/https://www.fda.gov/media/136313/download This test is not yet approved or cleared by the [place] FDA and has been authorized for detection and/or diagnosis of SARS-CoV-2 by FDA under an Emergency Use Authorization (EUA).  This EUA will remain in effect (meaning this test can be used) for the duration of the COVID-19 declaration under Section 564(b)(1) of the Act, 21 U.S.C. section 360bbb-3(b)(1), unless the authorization is terminated or revoked sooner. Performed at [facility], [address] [place], [place] [address]          Radiology Studies: Ct Abdomen Pelvis Wo Contrast  Result Date: 12/29/2018 CLINICAL DATA:  85 year old female with failure to thrive and weakness. Reduced intake over the past 48 hours. EXAM: CT ABDOMEN AND PELVIS WITHOUT CONTRAST TECHNIQUE:  Multidetector CT imaging of the abdomen and pelvis was performed following the standard protocol without IV contrast. COMPARISON:  CT the abdomen and pelvis 11/04/2017. FINDINGS: Lower chest: Trace left pleural effusion.  Aortic atherosclerosis. Hepatobiliary: No definite cystic or solid hepatic lesions are confidently identified on today's noncontrast CT examination. Gallbladder is unremarkable in appearance. Pancreas: Large low-attenuation lesion which appears centered in the head of the pancreas measuring up to 5.8 x 9.6 x 8.4 cm (axial image 30 of series 3 and coronal image 45 of series 6), similar to the prior examination. Additionally, in the right upper quadrant immediately anterolateral to this lesion there is a smaller 2.3 cm low-attenuation lesion which also appears similar to the prior examination (axial image 24 of series 3). Body and tail of the pancreas are mildly atrophic, but otherwise unremarkable in appearance. No peripancreatic inflammatory changes. Spleen: Unremarkable. Adrenals/Urinary Tract: Unenhanced appearance of the kidneys and bilateral adrenal glands is normal. No hydroureteronephrosis. Urinary bladder is normal in appearance. Stomach/Bowel: Normal appearance of the stomach. No  pathologic dilatation of small bowel or colon. Numerous colonic diverticulae are noted, particularly in the descending colon and sigmoid colon, without surrounding inflammatory changes to suggest an acute diverticulitis at this time. Normal appendix. Vascular/Lymphatic: Aortic atherosclerosis. No lymphadenopathy noted in the abdomen or pelvis. Reproductive: Small calcified lesions associated with the uterus, presumably calcified fibroids. Ovaries are a trophic. Other: No significant volume of ascites.  No pneumoperitoneum. Musculoskeletal: There are no aggressive appearing lytic or blastic lesions noted in the visualized portions of the skeleton. IMPRESSION: 1. No acute findings are noted in the abdomen or pelvis  to account for the patient's symptoms. 2. Severe colonic diverticulosis without evidence of acute diverticulitis at this time. 3. Large cystic lesions associated with the head of the pancreas grossly similar to prior examinations over the past several years, presumably benign pancreatic pseudocysts. 4. Trace left pleural effusion lying dependently. 5. Aortic atherosclerosis. 6. Additional incidental findings, as above. Electronically Signed   By: Trudie Reedaniel  Entrikin M.D.   On: 12/29/2018 17:47   Ct Head Wo Contrast  Result Date: 12/29/2018 CLINICAL DATA:  85 year old female with history of failure to thrive. EXAM: CT HEAD WITHOUT CONTRAST TECHNIQUE: Contiguous axial images were obtained from the base of the skull through the vertex without intravenous contrast. COMPARISON:  Head CT 09/02/2018. FINDINGS: Brain: Patchy and confluent areas of decreased attenuation are noted throughout the deep and periventricular white matter of the cerebral hemispheres bilaterally, compatible with chronic microvascular ischemic disease. Well-defined area of low attenuation in the left parietooccipital region, similar to prior study from 09/02/2018, compatible with an area of chronic encephalomalacia/gliosis from old left PCA territory infarction. No evidence of acute infarction, hemorrhage, hydrocephalus, extra-axial collection or mass lesion/mass effect. Vascular: No hyperdense vessel or unexpected calcification. Skull: Normal. Negative for fracture or focal lesion. Sinuses/Orbits: No acute finding. Other: None. IMPRESSION: 1. No acute intracranial abnormalities. 2. 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 Electronically Signed   By: Trudie Reedaniel  Entrikin M.D.   On: 12/29/2018 17:39   Dg Chest Port 1 View  Result Date: 12/29/2018 CLINICAL DATA:  Weakness and poor appetite. EXAM: PORTABLE CHEST 1 VIEW COMPARISON:  Chest x-ray 10/03/2013 FINDINGS: The cardiac silhouette,  mediastinal and hilar contours are within normal limits and stable. There is tortuosity and calcification of the thoracic aorta. The lungs are clear of an acute process. No worrisome pulmonary lesions. Mild a buckle scarring type changes. The bony thorax is intact. IMPRESSION: No acute cardiopulmonary findings. Electronically Signed   By: Rudie MeyerP.  Gallerani M.D.   On: 12/29/2018 15:09        Scheduled Meds:  aspirin EC  81 mg Oral Daily   atorvastatin  10 mg Oral q1800   dorzolamide-timolol  1 drop Both Eyes BID   enoxaparin (LOVENOX) injection  40 mg Subcutaneous QHS   sodium chloride flush  10-40 mL Intracatheter Q12H   Continuous Infusions:  sodium chloride 50 mL/hr at 12/30/18 0920     LOS: 0 days     Marcellus ScottAnand Mieke Brinley, MD, FACP, [facility]. [facility]  To contact the attending provider between 7A-7P or the covering provider during after hours 7P-7A, please log into the web site [email or web address] and access using universal [place] password for that web site. If you do not have the password, please call the hospital operator.  12/30/2018, 2:13 PM

## 2018-12-30 NOTE — Progress Notes (Signed)
Pt admitted to 5w10 from ED. Pt in no apparent distress. VSS. Skin assessment complete and in chart. Pt oriented to unit. Call light within reach and pt educated to call for assistance. Will continue to treat and monitor per MD orders.

## 2018-12-30 NOTE — Progress Notes (Signed)
Initial Nutrition Assessment  RD working remotely.  DOCUMENTATION CODES:   Not applicable  INTERVENTION:   -Boost Breeze po TID, each supplement provides 250 kcal and 9 grams of protein -RD will follow for diet advancement, goals of care, and adjust supplement regimen as necessary  NUTRITION DIAGNOSIS:   Inadequate oral intake related to decreased appetite as evidenced by meal completion < 25%, per patient/family report.  GOAL:   Patient will meet greater than or equal to 90% of their needs  MONITOR:   PO intake, Supplement acceptance, Diet advancement, Labs, Weight trends, Skin, I & O's  REASON FOR ASSESSMENT:   Malnutrition Screening Tool    ASSESSMENT:   [name] is a 85 y.o. female with medical history significant of Strokes, CKD stage 3.  Patient has had nausea, abd pain, and 1 episode of vomiting today, decreased PO intake over the past couple of days at home.  No BM for past several days.  Pt admitted with nausea, vomiting, and abdominal pain.   Reviewed I/O's: +2.3 L x 24 hours  UOP: 700 ml x 24 hours  Per H&P, pt has experienced a general decline in health since her stroke in February 2020. She has memory impairment and confusion, so is unable to provide addition history. PTA, pt had not been out of bed in a week and had been refusing to eat.   Pt is currently on a clear liquid diet. No meal completion records to assess at this time. Pt daughter is amenable to palliative care consult to further discuss goals of care and has expressed interest in home hospice services.   Per MD notes, pt daughter reports pt has lost 18 pounds within the past month. However, this is not consistent with wt hx. Noted pt has experienced a 8.6% wt loss over the past 4 months, which is concerning given advanced age, poor oral intake, and failure to thrive. Highly suspect pt has malnutrition, however, unable to identify at this time.   Labs reviewed.   Diet Order:   Diet  Order            Diet clear liquid Room service appropriate? Yes; Fluid consistency: Thin  Diet effective now              EDUCATION NEEDS:   No education needs have been identified at this time  Skin:  Skin Assessment: Reviewed RN Assessment  Last BM:     Height:   Ht Readings from Last 1 Encounters:  09/02/18 5\' 5"  (1.651 m)    Weight:   Wt Readings from Last 1 Encounters:  12/29/18 51.8 kg    Ideal Body Weight:  56.8 kg  BMI:  Body mass index is 19 kg/m.  Estimated Nutritional Needs:   Kcal:  1300-1500  Protein:  60-75 grams  Fluid:  > 1.3 L    Ranell Skibinski A. Jimmye Norman, RD, LDN, [place] Registered Dietitian II Certified Diabetes Care and Education Specialist Pager: [phone] After hours Pager: [phone]

## 2018-12-30 NOTE — Consult Note (Signed)
Consultation Note Date: 12/30/2018   Patient Name: [name]  DOB: 07/01/1921  MRN: [id number]  Age / Sex: 85 y.o., female  PCP: Vincente Liberty, MD Referring Physician: Modena Jansky, MD  Reason for Consultation: Establishing goals of care  HPI/Patient Profile: 85 y.o. female  with past medical history of stroke in Feb of this year, HTN, anemia, HLD, cardiomyopathy, CKD 3, and GERD admitted on 12/29/2018 with weakness, FTT, and poor PO intake. PMT consulted for [place].  Clinical Assessment and Goals of Care: I have reviewed medical records including EPIC notes, labs and imaging, received report from RN, and then spoke with patient's daughter, [name],  to discuss diagnosis prognosis, [place], EOL wishes, disposition and options.  I introduced Palliative Medicine as specialized medical care for people living with serious illness. It focuses on providing relief from the symptoms and stress of a serious illness. The goal is to improve quality of life for both the patient and the family.   As far as functional and nutritional status, [name] tells me of a rapid decline since the patient's stroke in February. She tells me patient has lost 20 pounds since then. She tells me she has been refusing most PO intake, even ensure that she used to drink consistently, for the past month. She also tells me patient is rarely ambulatory - will not get up to go to the bathroom. She has fallen three times recently. She has become progressively weaker. She is also refusing to go out, refuses to go to the doctor. [name] shares she believes patient has "given up the will to live".   We discussed her current illness and what it means in the larger context of her on-going co-morbidities.  Natural disease trajectory and expectations at EOL were discussed. Discussed her rapid decline since her stroke and overall failure to thrive.   I attempted to elicit values and goals  of care important to the patient.  [name] tells me the patient would not want to prolong life or prolong suffering. She knows that what is most important to the patient is being at home.   The difference between aggressive medical intervention and comfort care was considered in light of the patient's goals of care. [name] would like to focus on comfort and avoid aggressive medical interventions.   Hospice and Palliative Care services outpatient were explained and offered. [name] is interested in hospice care for the patient at home.   Questions and concerns were addressed. The family was encouraged to call with questions or concerns.   Primary Decision Maker NEXT OF KIN - daughter [name]    SUMMARY OF RECOMMENDATIONS   - consulted case management for hospice care at home (daughter states they need a hospital bed and ambulance transfer home) - comfort focused care - maintain DNR  Code Status/Advance Care Planning:  DNR   Symptom Management:   Per RN, comfortable  Psycho-social/Spiritual:   Desire for further Chaplaincy support:no  Additional Recommendations: Education on Hospice  Prognosis:   < 6 months - potentially much shorter - < 2 weeks if continues to refuse PO intake  Discharge Planning: Home with Hospice      Primary Diagnoses: Present on Admission: . Nausea & vomiting . CKD (chronic kidney disease), stage III ([place]) . Acute kidney injury superimposed on chronic kidney disease ([place])   I have reviewed the medical record, interviewed the patient and family, and examined the patient. The following aspects are pertinent.  Past Medical History:  Diagnosis Date  .  Acid reflux   . Bladder disease   . Cardiomyopathy   . Constipation   . Diverticulosis   . Interstitial cystitis   . Renal disorder    Social History   Socioeconomic History  . Marital status: Widowed    Spouse name: Not on file  . Number of children: Not on file  . Years of education: Not on  file  . Highest education level: Not on file  Occupational History  . Not on file  Social Needs  . Financial resource strain: Not on file  . Food insecurity:    Worry: Not on file    Inability: Not on file  . Transportation needs:    Medical: Not on file    Non-medical: Not on file  Tobacco Use  . Smoking status: Never Smoker  . Smokeless tobacco: Never Used  Substance and Sexual Activity  . Alcohol use: No  . Drug use: No  . Sexual activity: Not on file  Lifestyle  . Physical activity:    Days per week: Not on file    Minutes per session: Not on file  . Stress: Not on file  Relationships  . Social connections:    Talks on phone: Not on file    Gets together: Not on file    Attends religious service: Not on file    Active member of club or organization: Not on file    Attends meetings of clubs or organizations: Not on file    Relationship status: Not on file  Other Topics Concern  . Not on file  Social History Narrative  . Not on file   Family History  Family history unknown: Yes   Scheduled Meds: . aspirin EC  81 mg Oral Daily  . atorvastatin  10 mg Oral q1800  . dorzolamide-timolol  1 drop Both Eyes BID  . enoxaparin (LOVENOX) injection  40 mg Subcutaneous QHS  . senna  2 tablet Oral Daily  . sodium bicarbonate  650 mg Oral BID  . sodium chloride flush  10-40 mL Intracatheter Q12H   Continuous Infusions: . sodium chloride 50 mL/hr at 12/30/18 0920   PRN Meds:.acetaminophen **OR** acetaminophen, HYDROcodone-acetaminophen, linaclotide, LORazepam, ondansetron **OR** ondansetron (ZOFRAN) IV, pantoprazole, sodium chloride flush No Known Allergies  Vital Signs: BP (!) 130/42 (BP Location: Left Arm)   Pulse 67   Temp 97.6 F (36.4 C) (Oral)   Resp 20   Wt 51.8 kg   SpO2 100%   BMI 19.00 kg/m  Pain Scale: 0-10   Pain Score: 0-No pain   SpO2: SpO2: 100 % O2 Device:SpO2: 100 % O2 Flow Rate: .   IO: Intake/output summary:   Intake/Output Summary (Last  24 hours) at 12/30/2018 1648 Last data filed at 12/30/2018 1149 Gross per 24 hour  Intake 2363.26 ml  Output 701 ml  Net 1662.26 ml    LBM: Last BM Date: (PTA) Baseline Weight: Weight: 51.8 kg Most recent weight: Weight: 51.8 kg     Palliative Assessment/Data: PPS 20%    The above conversation was completed via telephone due to the visitor restrictions during the COVID-19 pandemic. Thorough chart review and discussion with necessary members of the care team was completed as part of assessment. All issues were discussed and addressed but no physical exam was performed.  Time Total: 50 minutes Greater than 50%  of this time was spent counseling and coordinating care related to the above assessment and plan.  Gerlean RenShae Lee Lasasha Brophy, DNP, AGNP-C Palliative Medicine Team  [phone] Pager: [phone]

## 2018-12-30 NOTE — Progress Notes (Signed)
CRITICAL VALUE ALERT  Critical Value:  Lactic acid 2.3   Date & Time Notied:  12/30/18 0355  Provider Notified: [place]  Orders Received/Actions taken: Awaiting orders.

## 2018-12-31 LAB — BASIC METABOLIC PANEL
Anion gap: 7 (ref 5–15)
BUN: 20 mg/dL (ref 8–23)
CO2: 21 mmol/L — ABNORMAL LOW (ref 22–32)
Calcium: 8.1 mg/dL — ABNORMAL LOW (ref 8.9–10.3)
Chloride: 109 mmol/L (ref 98–111)
Creatinine, Ser: 1.17 mg/dL — ABNORMAL HIGH (ref 0.44–1.00)
GFR calc Af Amer: 46 mL/min — ABNORMAL LOW (ref >60)
GFR calc non Af Amer: 40 mL/min — ABNORMAL LOW (ref >60)
Glucose, Bld: 96 mg/dL (ref 70–99)
Potassium: 4 mmol/L (ref 3.5–5.1)
Sodium: 137 mmol/L (ref 135–145)

## 2018-12-31 LAB — CBC
HCT: 28.2 % — ABNORMAL LOW (ref 36.0–46.0)
Hemoglobin: 9.1 g/dL — ABNORMAL LOW (ref 12.0–15.0)
MCH: 32 pg (ref 26.0–34.0)
MCHC: 32.3 g/dL (ref 30.0–36.0)
MCV: 99.3 fL (ref 80.0–100.0)
Platelets: 294 10*3/uL (ref 150–400)
RBC: 2.84 MIL/uL — ABNORMAL LOW (ref 3.87–5.11)
RDW: 15.4 % (ref 11.5–15.5)
WBC: 4.9 10*3/uL (ref 4.0–10.5)
nRBC: 0 % (ref 0.0–0.2)

## 2018-12-31 MED ORDER — HYDROCODONE-ACETAMINOPHEN 5-325 MG PO TABS: 1.0000 | ORAL_TABLET | Freq: Four times a day (QID) | ORAL | 0 refills | Status: AC | PRN

## 2018-12-31 MED ORDER — ONDANSETRON HCL 4 MG PO TABS: 4.0000 mg | ORAL_TABLET | Freq: Four times a day (QID) | ORAL | 0 refills | Status: AC | PRN

## 2018-12-31 MED ORDER — LORAZEPAM 0.5 MG PO TABS: 0.5000 mg | ORAL_TABLET | Freq: Two times a day (BID) | ORAL | 0 refills | Status: AC | PRN

## 2018-12-31 MED ORDER — SENNA 8.6 MG PO TABS: 2.0000 | ORAL_TABLET | Freq: Every day | ORAL | 0 refills | Status: AC

## 2018-12-31 MED ORDER — ENOXAPARIN SODIUM 30 MG/0.3ML ~~LOC~~ SOLN
30.0000 mg | Freq: Every day | SUBCUTANEOUS | Status: DC
  Administered 2018-12-31: 30 mg via SUBCUTANEOUS
  Filled 2018-12-31: qty 0.3

## 2018-12-31 NOTE — TOC Progression Note (Addendum)
Transition of Care [facility]) - Progression Note    Patient Details  Name: [name] MRN: [id number] Date of Birth: 07/25/1920  [facility] of [facility]) CM/SW Contact  Maryclare Labrador, RN Phone Number: 12/31/2018, 8:21 AM  Clinical Narrative:   Pt to discharge home with hospice.  Per palliative care CM contacted pts daughter [name] - CM provided [email or web address] Home with hospice list and [name] chose [facility].  CM called referral in to agency and awaiting call back  Update:  [facility] accepted pt, equipment has been ordered and will be delivered to the home - [name] will receive.  [name] to call bedside nurse once equipment is delivered.  CSW will provide bedside nurse transport pkg and nurse will call PT[facility] when appropriate.  Expected Discharge Plan: [place]    Expected Discharge Plan and Services Expected Discharge Plan: [place] Acute Care Choice: Hospice Living arrangements for the past 2 months: Single Family Home                           HH Arranged: (Home Hospice) [place]: Hospice and [place] Date [place]: 12/31/18 Time [place]: [phone] Representative spoke with at [place]: Arta Bruce - Network engineer    Social Determinants of Health ([place]) Interventions    Readmission Risk Interventions No flowsheet data found.

## 2018-12-31 NOTE — Progress Notes (Signed)
Hydrologist [facility])  Hospital Liaison: RN note   Notified by Elenor Quinones, CMRN of patient/family request for [facility] services at home after discharge. Chart and patient reviewed by [facility] physician. Hospice eligibility confirmed.     Writer spoke with daughter, [name] to initiate education related to hospice philosophy, services and team approach to care.  [name] verbalized understanding of information given. Per discussion, plan is for discharge to home by PTAR after hospital bed is delivered. .     Please send signed and completed DNR form home with patient/family. Patient will need prescriptions for discharge comfort medications.     DME needs have been discussed, patient currently has the following equipment in the home: walker.  Patient/family requests the following DME for delivery to the home: hospital bed and 3N1. [place] equipment manager has been notified and will contact [employer] to arrange delivery to the home. Home address has been verified and is correct in the chart. [name] is the family member to contact to arrange time of delivery.     [facility] aware of the above. Please notify [facility] when patient is ready to leave the unit at discharge. (Call [phone] or [phone] after 5pm.) [facility] information and contact numbers given to [facility].      Please call with any hospice related questions.     Thank you for this referral.     Farrel Gordon, RN, CCM  [place] (listed on AMION under Hospice and [place] [facility] [place])  [phone]

## 2018-12-31 NOTE — Discharge Summary (Addendum)
Physician Discharge Summary  Lorra HalsBernice F Courtois RUE:[id number]:[id number] DOB: [birth year] DOA: 12/29/2018  PCP: Corine ShelterKilpatrick, George, MD  Admit date: 12/29/2018 Discharge date:  01/01/19  Time spent: 40 minutes  Recommendations for Outpatient Follow-up:  1. Patient to be discharged home with home hospice.   Discharge Diagnoses:  Principal Problem:   Nausea & vomiting Active Problems:   CKD (chronic kidney disease), stage III (HCC)   Acute kidney injury superimposed on chronic kidney disease (HCC)   Adult failure to thrive   Goals of care, counseling/discussion   Palliative care by specialist   Discharge Condition: Stable  Diet recommendation: Regular diet  Filed Weights   12/29/18 2249  Weight: 51.8 kg    History of present illness:  85 year old female with history of acute embolic stroke in left MCA on February 2020 for which she received IV TPA and underwent mechanical thrombectomy for left M2 occlusion, hypertension, acute blood loss anemia, hyperlipidemia, cardiomyopathy, stage III CKD, GERD came to the ED with progressively worsening generalized weakness, decreased p.o. intake, episode of nonbloody emesis, diffuse abdominal pain.  Also complained of constipation.  CT abdomen pelvis and CT head showed no acute abnormalities.  Patient admitted for abdominal pain, acute urinary retention.  Failure to thrive.  Hospital Course:   Nausea vomiting abdominal pain-unclear etiology, resolved at this time.  Seated on previous showed severe colonic diverticulosis without diverticulitis.  Could be viral gastroenteritis.  SARS coronavirus 2 test is negative.  Patient started on bowel regimen with Senokot tablets, Zofran PRN for nausea vomiting.  PPI daily.  Acute urine retention-bladder scan showed greater than 500 cc of urine.  In and out cath PRN.  Acute on chronic kidney disease stage III-likely due to poor p.o. intake and GI losses.  Baseline creatinine is around 1.6.  Presented with creatinine of  1.83 which improved to 1.6 with IV fluids.  Anemia chronic disease-stable  Recent acute stroke-CT head showed no acute findings.  Continue aspirin.  Failure to thrive-palliative care was consulted and patient's daughter expressed wishes to be discharged home with hospice.  Patient will be discharged home with hospice today.    Consultations:  Palliative care  Discharge Exam: Vitals:   01/01/19 0058 01/01/19 0549  BP: (!) 146/44 (!) 150/59  Pulse: 64 70  Resp:  16  Temp:  98.1 F (36.7 C)  SpO2: 100% 100%    General: Appears in no acute distress Cardiovascular: S1S2 RRR Respiratory: Clear bilaterally  Discharge Instructions   Discharge Instructions    Diet - low sodium heart healthy   Complete by: As directed    Increase activity slowly   Complete by: As directed      Allergies as of 01/01/2019   No Known Allergies     Medication List    TAKE these medications   aspirin 81 MG EC tablet Take 1 tablet (81 mg total) by mouth daily. Notes to patient: 01/02/2019   atorvastatin 10 MG tablet Commonly known as: LIPITOR Take 1 tablet (10 mg total) by mouth daily at 6 PM. Notes to patient: 01/01/2019 evening   dorzolamide-timolol 22.3-6.8 MG/ML ophthalmic solution Commonly known as: COSOPT Place 1 drop into both eyes 2 (two) times daily. Notes to patient: 01/01/2019 evening   FLEET ENEMA RE Place 1 Container rectally daily as needed (constipation).   HYDROcodone-acetaminophen 5-325 MG tablet Commonly known as: NORCO/VICODIN Take 1 tablet by mouth every 6 (six) hours as needed for up to 5 days for moderate pain. What changed:   when  to take this  reasons to take this   Linzess 145 MCG Caps capsule Generic drug: linaclotide Take 145 mcg by mouth daily as needed (constipation).   LORazepam 0.5 MG tablet Commonly known as: ATIVAN Take 1 tablet (0.5 mg total) by mouth 2 (two) times daily as needed for anxiety or sleep. What changed:   when to take  this  reasons to take this   omeprazole 20 MG tablet Commonly known as: PRILOSEC OTC Take 20 mg by mouth daily as needed (acid reflux).   ondansetron 4 MG tablet Commonly known as: ZOFRAN Take 1 tablet (4 mg total) by mouth every 6 (six) hours as needed for nausea.   senna 8.6 MG Tabs tablet Commonly known as: SENOKOT Take 2 tablets (17.2 mg total) by mouth daily. Notes to patient: 01/02/2019   shark liver oil-cocoa butter 0.25-3-85.5 % suppository Commonly known as: PREPARATION H Place 1 suppository rectally as needed for hemorrhoids.      No Known Allergies    The results of significant diagnostics from this hospitalization (including imaging, microbiology, ancillary and laboratory) are listed below for reference.    Significant Diagnostic Studies: Ct Abdomen Pelvis Wo Contrast  Result Date: 12/29/2018 CLINICAL DATA:  85 year old female with failure to thrive and weakness. Reduced intake over the past 48 hours. EXAM: CT ABDOMEN AND PELVIS WITHOUT CONTRAST TECHNIQUE: Multidetector CT imaging of the abdomen and pelvis was performed following the standard protocol without IV contrast. COMPARISON:  CT the abdomen and pelvis 11/04/2017. FINDINGS: Lower chest: Trace left pleural effusion.  Aortic atherosclerosis. Hepatobiliary: No definite cystic or solid hepatic lesions are confidently identified on today's noncontrast CT examination. Gallbladder is unremarkable in appearance. Pancreas: Large low-attenuation lesion which appears centered in the head of the pancreas measuring up to 5.8 x 9.6 x 8.4 cm (axial image 30 of series 3 and coronal image 45 of series 6), similar to the prior examination. Additionally, in the right upper quadrant immediately anterolateral to this lesion there is a smaller 2.3 cm low-attenuation lesion which also appears similar to the prior examination (axial image 24 of series 3). Body and tail of the pancreas are mildly atrophic, but otherwise unremarkable in  appearance. No peripancreatic inflammatory changes. Spleen: Unremarkable. Adrenals/Urinary Tract: Unenhanced appearance of the kidneys and bilateral adrenal glands is normal. No hydroureteronephrosis. Urinary bladder is normal in appearance. Stomach/Bowel: Normal appearance of the stomach. No pathologic dilatation of small bowel or colon. Numerous colonic diverticulae are noted, particularly in the descending colon and sigmoid colon, without surrounding inflammatory changes to suggest an acute diverticulitis at this time. Normal appendix. Vascular/Lymphatic: Aortic atherosclerosis. No lymphadenopathy noted in the abdomen or pelvis. Reproductive: Small calcified lesions associated with the uterus, presumably calcified fibroids. Ovaries are a trophic. Other: No significant volume of ascites.  No pneumoperitoneum. Musculoskeletal: There are no aggressive appearing lytic or blastic lesions noted in the visualized portions of the skeleton. IMPRESSION: 1. No acute findings are noted in the abdomen or pelvis to account for the patient's symptoms. 2. Severe colonic diverticulosis without evidence of acute diverticulitis at this time. 3. Large cystic lesions associated with the head of the pancreas grossly similar to prior examinations over the past several years, presumably benign pancreatic pseudocysts. 4. Trace left pleural effusion lying dependently. 5. Aortic atherosclerosis. 6. Additional incidental findings, as above. Electronically Signed   By: Trudie Reedaniel  Entrikin M.D.   On: 12/29/2018 17:47   Ct Head Wo Contrast  Result Date: 12/29/2018 CLINICAL DATA:  85 year old female with history  of failure to thrive. EXAM: CT HEAD WITHOUT CONTRAST TECHNIQUE: Contiguous axial images were obtained from the base of the skull through the vertex without intravenous contrast. COMPARISON:  Head CT 09/02/2018. FINDINGS: Brain: Patchy and confluent areas of decreased attenuation are noted throughout the deep and periventricular white  matter of the cerebral hemispheres bilaterally, compatible with chronic microvascular ischemic disease. Well-defined area of low attenuation in the left parietooccipital region, similar to prior study from 09/02/2018, compatible with an area of chronic encephalomalacia/gliosis from old left PCA territory infarction. No evidence of acute infarction, hemorrhage, hydrocephalus, extra-axial collection or mass lesion/mass effect. Vascular: No hyperdense vessel or unexpected calcification. Skull: Normal. Negative for fracture or focal lesion. Sinuses/Orbits: No acute finding. Other: None. IMPRESSION: 1. No acute intracranial abnormalities. 2. 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 Electronically Signed   By: Trudie Reedaniel  Entrikin M.D.   On: 12/29/2018 17:39   Dg Chest Port 1 View  Result Date: 12/29/2018 CLINICAL DATA:  Weakness and poor appetite. EXAM: PORTABLE CHEST 1 VIEW COMPARISON:  Chest x-ray 10/03/2013 FINDINGS: The cardiac silhouette, mediastinal and hilar contours are within normal limits and stable. There is tortuosity and calcification of the thoracic aorta. The lungs are clear of an acute process. No worrisome pulmonary lesions. Mild a buckle scarring type changes. The bony thorax is intact. IMPRESSION: No acute cardiopulmonary findings. Electronically Signed   By: Rudie MeyerP.  Gallerani M.D.   On: 12/29/2018 15:09    Microbiology: Recent Results (from the past 240 hour(s))  SARS Coronavirus 2 (CEPHEID - Performed in [facility] hospital lab), Hosp Order     Status: None   Collection Time: 12/29/18  7:30 PM   Specimen: Nasopharyngeal Swab  Result Value Ref Range Status   SARS Coronavirus 2 NEGATIVE NEGATIVE Final    Comment: (NOTE) If result is NEGATIVE SARS-CoV-2 target nucleic acids are NOT DETECTED. The SARS-CoV-2 RNA is generally detectable in upper and lower  respiratory specimens during the acute phase of infection. The lowest   concentration of SARS-CoV-2 viral copies this assay can detect is 250  copies / mL. A negative result does not preclude SARS-CoV-2 infection  and should not be used as the sole basis for treatment or other  patient management decisions.  A negative result may occur with  improper specimen collection / handling, submission of specimen other  than nasopharyngeal swab, presence of viral mutation(s) within the  areas targeted by this assay, and inadequate number of viral copies  (<250 copies / mL). A negative result must be combined with clinical  observations, patient history, and epidemiological information. If result is POSITIVE SARS-CoV-2 target nucleic acids are DETECTED. The SARS-CoV-2 RNA is generally detectable in upper and lower  respiratory specimens dur ing the acute phase of infection.  Positive  results are indicative of active infection with SARS-CoV-2.  Clinical  correlation with patient history and other diagnostic information is  necessary to determine patient infection status.  Positive results do  not rule out bacterial infection or co-infection with other viruses. If result is PRESUMPTIVE POSTIVE SARS-CoV-2 nucleic acids MAY BE PRESENT.   A presumptive positive result was obtained on the submitted specimen  and confirmed on repeat testing.  While 2019 novel coronavirus  (SARS-CoV-2) nucleic acids may be present in the submitted sample  additional confirmatory testing may be necessary for epidemiological  and / or clinical management purposes  to differentiate between  SARS-CoV-2 and other Sarbecovirus currently known to infect humans.  If clinically indicated additional testing with an alternate test  methodology [phone]) is advised. The SARS-CoV-2 RNA is generally  detectable in upper and lower respiratory sp ecimens during the acute  phase of infection. The expected result is Negative. Fact Sheet for Patients:  StrictlyIdeas.no Fact Sheet  for Healthcare Providers: BankingDealers.co.za This test is not yet approved or cleared by the [place] FDA and has been authorized for detection and/or diagnosis of SARS-CoV-2 by FDA under an Emergency Use Authorization (EUA).  This EUA will remain in effect (meaning this test can be used) for the duration of the COVID-19 declaration under Section 564(b)(1) of the Act, 21 U.S.C. section 360bbb-3(b)(1), unless the authorization is terminated or revoked sooner. Performed at [facility], [place] [address] [place], [place] [address]      Labs: Basic Metabolic Panel: Recent Labs  Lab 12/29/18 1512 12/29/18 1600 12/30/18 0339 12/31/18 0433  NA 130* 135 135 137  K 5.3* 5.0 4.8 4.0  CL 109 102 107 109  CO2  --  18* 16* 21*  GLUCOSE 66* 83 82 96  BUN 24* 24* 23 20  CREATININE 1.50* 1.83* 1.65* 1.17*  CALCIUM  --  8.5* 8.1* 8.1*   Liver Function Tests: Recent Labs  Lab 12/29/18 1600  AST 24  ALT 13  ALKPHOS 57  BILITOT 1.3*  PROT 5.2*  ALBUMIN 2.6*   Recent Labs  Lab 12/29/18 1600  LIPASE 19   No results for input(s): AMMONIA in the last 168 hours. CBC: Recent Labs  Lab 12/29/18 1512 12/29/18 1600 12/30/18 0339 12/31/18 0433  WBC  --  7.6 7.1 4.9  NEUTROABS  --  5.9  --   --   HGB 12.2 10.6* 9.5* 9.1*  HCT 36.0 33.2* 30.0* 28.2*  MCV  --  101.2* 99.3 99.3  PLT  --  330 326 294       Signed:  Oswald Hillock MD.  [facility] 01/01/2019, 11:39 AM

## 2018-12-31 NOTE — Clinical Social Work Note (Signed)
CSW advised earlier today that patient ready for discharge home with hospice services. CSW just informed by nurse that patient's bed will be delivered in the morning, and she will discharge once bed arrives at her home. Per nurse, MD is aware. CSW will continue to follow and assist with arranging transport for patient on 6/11.  Aemon Koeller Givens, MSW, LCSW Licensed Clinical Social Worker [place] [phone]

## 2018-12-31 NOTE — Progress Notes (Signed)
Nutrition Brief Note RD working remotely. Chart reviewed. Pt now transitioning to comfort care.  No further nutrition interventions warranted at this time.  Please re-consult as needed.   Doy Taaffe A. Kirbi Farrugia, RD, LDN, CDCES Registered Dietitian II Certified Diabetes Care and Education Specialist Pager: [phone] After hours Pager: [phone]  

## 2019-01-01 NOTE — Progress Notes (Signed)
Patient's discharge was held yesterday as all the equipment with hospice was not delivered at home.  Patient will be discharged home today.

## 2019-01-01 NOTE — Progress Notes (Signed)
Nsg Discharge Note  Admit Date:  12/29/2018 Discharge date: 01/01/2019   [name] to be D/C'd Home with Hospice per MD order.  AVS completed.  Copy for chart, and copy for patient signed, and dated. Patient/caregiver able to verbalize understanding.  Discharge Medication: Allergies as of 01/01/2019   No Known Allergies     Medication List    TAKE these medications   aspirin 81 MG EC tablet Take 1 tablet (81 mg total) by mouth daily. Notes to patient: 01/02/2019   atorvastatin 10 MG tablet Commonly known as: LIPITOR Take 1 tablet (10 mg total) by mouth daily at 6 PM. Notes to patient: 01/01/2019 evening   dorzolamide-timolol 22.3-6.8 MG/ML ophthalmic solution Commonly known as: COSOPT Place 1 drop into both eyes 2 (two) times daily. Notes to patient: 01/01/2019 evening   FLEET ENEMA RE Place 1 Container rectally daily as needed (constipation).   HYDROcodone-acetaminophen 5-325 MG tablet Commonly known as: NORCO/VICODIN Take 1 tablet by mouth every 6 (six) hours as needed for up to 5 days for moderate pain. What changed:   when to take this  reasons to take this   Linzess 145 MCG Caps capsule Generic drug: linaclotide Take 145 mcg by mouth daily as needed (constipation).   LORazepam 0.5 MG tablet Commonly known as: ATIVAN Take 1 tablet (0.5 mg total) by mouth 2 (two) times daily as needed for anxiety or sleep. What changed:   when to take this  reasons to take this   omeprazole 20 MG tablet Commonly known as: PRILOSEC OTC Take 20 mg by mouth daily as needed (acid reflux).   ondansetron 4 MG tablet Commonly known as: ZOFRAN Take 1 tablet (4 mg total) by mouth every 6 (six) hours as needed for nausea.   senna 8.6 MG Tabs tablet Commonly known as: SENOKOT Take 2 tablets (17.2 mg total) by mouth daily. Notes to patient: 01/02/2019   shark liver oil-cocoa butter 0.25-3-85.5 % suppository Commonly known as: PREPARATION H Place 1 suppository rectally as  needed for hemorrhoids.       Discharge Assessment: Vitals:   01/01/19 0058 01/01/19 0549  BP: (!) 146/44 (!) 150/59  Pulse: 64 70  Resp:  16  Temp:  98.1 F (36.7 C)  SpO2: 100% 100%   Skin clean, dry and intact without evidence of skin break down, no evidence of skin tears noted. IV catheter discontinued intact. Site without signs and symptoms of complications - no redness or edema noted at insertion site, patient denies c/o pain - only slight tenderness at site.  Dressing with slight pressure applied.  D/c Instructions-Education: Discharge instructions given to patient/family with verbalized understanding. D/c education completed with patient/family including follow up instructions, medication list, d/c activities limitations if indicated, with other d/c instructions as indicated by MD - patient able to verbalize understanding, all questions fully answered. Patient instructed to return to ED, call 911, or call MD for any changes in condition.  Patient transported home via ambulance.  Tresa Endo, RN 01/01/2019 11:43 AM

## 2019-02-21 DEATH — deceased
